# Patient Record
Sex: Male | Born: 1935 | Race: White | Hispanic: No | Smoking: Former smoker
Health system: Southern US, Community
[De-identification: ages and names within clinical notes are randomized; demographics above are authoritative.]

## PROBLEM LIST (undated history)

## (undated) DIAGNOSIS — K921 Melena: Secondary | ICD-10-CM

## (undated) DIAGNOSIS — I10 Essential (primary) hypertension: Secondary | ICD-10-CM

## (undated) DIAGNOSIS — C679 Malignant neoplasm of bladder, unspecified: Secondary | ICD-10-CM

## (undated) DIAGNOSIS — Z9889 Other specified postprocedural states: Secondary | ICD-10-CM

## (undated) DIAGNOSIS — I480 Paroxysmal atrial fibrillation: Secondary | ICD-10-CM

## (undated) DIAGNOSIS — K5792 Diverticulitis of intestine, part unspecified, without perforation or abscess without bleeding: Secondary | ICD-10-CM

## (undated) DIAGNOSIS — I499 Cardiac arrhythmia, unspecified: Secondary | ICD-10-CM

## (undated) DIAGNOSIS — IMO0002 Reserved for concepts with insufficient information to code with codable children: Secondary | ICD-10-CM

## (undated) DIAGNOSIS — N179 Acute kidney failure, unspecified: Secondary | ICD-10-CM

## (undated) DIAGNOSIS — IMO0001 Reserved for inherently not codable concepts without codable children: Secondary | ICD-10-CM

## (undated) DIAGNOSIS — Z5189 Encounter for other specified aftercare: Secondary | ICD-10-CM

## (undated) DIAGNOSIS — C61 Malignant neoplasm of prostate: Secondary | ICD-10-CM

## (undated) DIAGNOSIS — G709 Myoneural disorder, unspecified: Secondary | ICD-10-CM

## (undated) HISTORY — DX: Malignant neoplasm of prostate: C61

## (undated) HISTORY — DX: Diverticulitis of intestine, part unspecified, without perforation or abscess without bleeding: K57.92

## (undated) HISTORY — PX: BLADDER REMOVAL: SHX567

## (undated) HISTORY — DX: Encounter for other specified aftercare: Z51.89

## (undated) HISTORY — DX: Reserved for concepts with insufficient information to code with codable children: IMO0002

## (undated) HISTORY — PX: COLOSTOMY: SHX63

## (undated) HISTORY — DX: Reserved for inherently not codable concepts without codable children: IMO0001

## (undated) HISTORY — DX: Melena: K92.1

## (undated) HISTORY — PX: OTHER SURGICAL HISTORY: SHX169

## (undated) HISTORY — DX: Acute kidney failure, unspecified: N17.9

## (undated) HISTORY — DX: Essential (primary) hypertension: I10

## (undated) HISTORY — DX: Other specified postprocedural states: Z98.890

## (undated) HISTORY — DX: Malignant neoplasm of bladder, unspecified: C67.9

## (undated) HISTORY — DX: Paroxysmal atrial fibrillation: I48.0

---

## 1991-09-16 HISTORY — PX: PROSTATECTOMY: SHX69

## 2006-10-08 ENCOUNTER — Encounter: Payer: Self-pay | Admitting: Family Medicine

## 2007-02-16 ENCOUNTER — Emergency Department: Payer: Self-pay | Admitting: Emergency Medicine

## 2007-04-06 ENCOUNTER — Ambulatory Visit: Payer: Self-pay | Admitting: Urology

## 2007-04-06 ENCOUNTER — Other Ambulatory Visit: Payer: Self-pay

## 2007-04-20 ENCOUNTER — Ambulatory Visit: Payer: Self-pay | Admitting: Urology

## 2007-11-24 ENCOUNTER — Emergency Department: Payer: Self-pay | Admitting: Emergency Medicine

## 2009-11-14 ENCOUNTER — Encounter: Payer: Self-pay | Admitting: Family Medicine

## 2010-02-03 ENCOUNTER — Emergency Department: Payer: Self-pay | Admitting: Emergency Medicine

## 2010-02-14 ENCOUNTER — Ambulatory Visit: Payer: Self-pay | Admitting: Gastroenterology

## 2010-05-14 ENCOUNTER — Ambulatory Visit: Payer: Self-pay | Admitting: Gastroenterology

## 2010-05-17 LAB — PATHOLOGY REPORT

## 2010-06-05 ENCOUNTER — Encounter: Payer: Self-pay | Admitting: Family Medicine

## 2010-07-23 ENCOUNTER — Ambulatory Visit: Payer: Self-pay | Admitting: Family Medicine

## 2010-07-23 DIAGNOSIS — K259 Gastric ulcer, unspecified as acute or chronic, without hemorrhage or perforation: Secondary | ICD-10-CM | POA: Insufficient documentation

## 2010-07-23 DIAGNOSIS — K921 Melena: Secondary | ICD-10-CM | POA: Insufficient documentation

## 2010-07-23 DIAGNOSIS — R32 Unspecified urinary incontinence: Secondary | ICD-10-CM | POA: Insufficient documentation

## 2010-10-15 NOTE — Letter (Signed)
Summary: [facility]   Imported By: Maryln Gottron 08/12/2010 14:59:27  _____________________________________________________________________  External Attachment:    Type:   Image     Comment:   External Document

## 2010-10-15 NOTE — Letter (Signed)
Summary: [facility]   Imported By: Maryln Gottron 08/12/2010 14:57:28  _____________________________________________________________________  External Attachment:    Type:   Image     Comment:   External Document

## 2010-10-15 NOTE — Assessment & Plan Note (Signed)
Summary: NEW PT TO BE ESTABLISHED/Timothy Bullock   Vital Signs:  Patient profile:   77 year old male Height:      72 inches Weight:      192.75 pounds BMI:     26.24 Temp:     97.9 degrees F oral Pulse rate:   56 / minute Pulse rhythm:   regular BP sitting:   124 / 80  (left arm) Cuff size:   regular  Vitals Entered By: Delilah Shan CMA Duncan Dull) (July 23, 2010 10:16 AM) CC: New Patient to Establish   History of Present Illness: GIB and endoscopy 2011 per [facility]- gastric ulcer found- treated and healed.  Now on prilosec 20mg  a day.  Had colonoscopy done about 6 years ago.    Autologous transfusion with prostate surgery.  followed by Dr. Achilles Dunk with Uro.    Has incontinence and wears a pad.  We discussed this today.   Preventive Screening-Counseling & Management  Alcohol-Tobacco     Smoking Status: quit  Caffeine-Diet-Exercise     Does Patient Exercise: yes      Drug Use:  no.        Blood Transfusions:  yes.    Allergies (verified): No Known Drug Allergies  Past History:  Family History: Last updated: 07/23/2010 Family History of Colon CA 1st degree relative <60, parents F dead at 36, smoker, alcohol, cancer of tongue? M dead at 56, h/o colon CA, died from staph infection after colon CA surgery  Social History: Last updated: 07/23/2010 Retired [employer] Married, 1982 Former Smoker, 1967 Alcohol use-yes, rare Drug use-no Regular exercise-yes, walking, golf From [place]  Past Medical History: Blood transfusion-autologous donation with prostate surgery GASTRIC ULCER (ICD-531.90) BLOOD IN STOOL (ICD-578.1) URINARY INCONTINENCE (ICD-788.30) Prostate CA- s/p resection.    Past Surgical History: 1993  Prostate Removal  Family History: Reviewed history and no changes required. Family History of Colon CA 1st degree relative <60, parents F dead at 60, smoker, alcohol, cancer of tongue? M dead at 86, h/o colon CA, died from staph infection after colon  CA surgery  Social History: Reviewed history and no changes required. Retired [employer] Married, 1982 Former Smoker, 1967 Alcohol use-yes, rare Drug use-no Regular exercise-yes, walking, golf From [employer] Transfusions:  yes Smoking Status:  quit Drug Use:  no Does Patient Exercise:  yes  Review of Systems       See HPI.  Otherwise negative.    Physical Exam  General:  GEN: nad, alert and oriented HEENT: mucous membranes moist NECK: supple w/o LA CV: rrr.  no murmur PULM: ctab, no inc wob ABD: soft, +bs EXT: no edema SKIN: no acute rash    Impression & Recommendations:  Problem # 1:  URINARY INCONTINENCE (ICD-788.30) We talked about options.  He is considering surgical intervention and I advised him to weight the risk and benefits.  He plans to discuss further with uro.  It does affect his life.    Problem # 2:  ? of CHICKENPOX (ICD-052.9) Unclear if patient ever had varicella. Will check lab and notify patient.  If pos, then he would be candidate for zostavax.  Orders: T- * Misc. Laboratory test [phone])  Complete Medication List: 1)  Glucosamine-chondroitin 250-200 Mg Caps (Glucosamine-chondroitin) .... Take 1 tablet by mouth once a day  Other Orders: Flu Vaccine 58yrs + MEDICARE PATIENTS (J8119) Administration Flu vaccine - MCR (J4782)  Patient Instructions: 1)  You can get your results through our phone  system.  Follow the instructions on the blue card.  Take care.  We'll requeset your records.  Take care.  Glad to see you today.  Let me know if you have other concerns.    Orders Added: 1)  New Patient Level II [99202] 2)  T- * Misc. Laboratory test [99999] 3)  Flu Vaccine 42yrs + MEDICARE PATIENTS [Q2039] 4)  Administration Flu vaccine - MCR [G0008]    Current Allergies (reviewed today): No known allergies    Flu Vaccine Consent Questions     Do you have a history of severe allergic reactions to this vaccine? no    Any prior history  of allergic reactions to egg and/or gelatin? no    Do you have a sensitivity to the preservative Thimersol? no    Do you have a past history of Guillan-Barre Syndrome? no    Do you currently have an acute febrile illness? no    Have you ever had a severe reaction to latex? no    Vaccine information given and explained to patient? yes    Are you currently pregnant? no    Lot Number:[id number]   Exp Date:03/15/2011   Site Given  Left Deltoid IM[facility] Lugene Fuquay CMA (AAMA)  July 23, 2010 11:40 AM

## 2010-10-15 NOTE — Letter (Signed)
Summary: [facility]   Imported By: Maryln Gottron 08/12/2010 15:01:21  _____________________________________________________________________  External Attachment:    Type:   Image     Comment:   External Document

## 2011-02-05 ENCOUNTER — Telehealth: Payer: Self-pay | Admitting: *Deleted

## 2011-02-05 MED ORDER — OMEPRAZOLE 20 MG PO CPDR: 20.0000 mg | DELAYED_RELEASE_CAPSULE | Freq: Every day | ORAL | Status: DC

## 2011-02-05 NOTE — Telephone Encounter (Signed)
Pt's wife is asking that pt gets zostavax.  She says she has checked with their insurance company and it is covered. Please advise if ok to schedule.  Also, pt has been taking prilosec otc but would like to get a script sent to [facility].

## 2011-02-05 NOTE — Telephone Encounter (Signed)
prilosec order send to [facility].  I would get the zostavax.  Please help patient arrange this.  Thanks.

## 2011-02-06 NOTE — Telephone Encounter (Signed)
Left message on machine at home advising patient as instructed.  Advised him to either contact his pharmacy or call our office to get shingles vaccine.

## 2011-02-12 ENCOUNTER — Ambulatory Visit (INDEPENDENT_AMBULATORY_CARE_PROVIDER_SITE_OTHER): Payer: Medicare Other | Admitting: Family Medicine

## 2011-02-12 DIAGNOSIS — Z23 Encounter for immunization: Secondary | ICD-10-CM

## 2011-02-12 DIAGNOSIS — Z2911 Encounter for prophylactic immunotherapy for respiratory syncytial virus (RSV): Secondary | ICD-10-CM

## 2011-02-12 NOTE — Progress Notes (Signed)
Zostavax given SQ Right deltoid

## 2011-06-30 ENCOUNTER — Encounter: Payer: Self-pay | Admitting: Family Medicine

## 2011-07-01 ENCOUNTER — Encounter: Payer: Self-pay | Admitting: Family Medicine

## 2011-07-01 ENCOUNTER — Ambulatory Visit (INDEPENDENT_AMBULATORY_CARE_PROVIDER_SITE_OTHER): Payer: Medicare Other | Admitting: Family Medicine

## 2011-07-01 VITALS — BP 152/70 | HR 58 | Temp 97.9°F | Wt 192.0 lb

## 2011-07-01 DIAGNOSIS — Z23 Encounter for immunization: Secondary | ICD-10-CM

## 2011-07-01 DIAGNOSIS — R32 Unspecified urinary incontinence: Secondary | ICD-10-CM

## 2011-07-01 DIAGNOSIS — IMO0001 Reserved for inherently not codable concepts without codable children: Secondary | ICD-10-CM

## 2011-07-01 DIAGNOSIS — R030 Elevated blood-pressure reading, without diagnosis of hypertension: Secondary | ICD-10-CM

## 2011-07-01 LAB — BASIC METABOLIC PANEL
BUN: 17 mg/dL (ref 6–23)
Calcium: 8.9 mg/dL (ref 8.4–10.5)
GFR: 104.07 mL/min (ref >60.00)
Potassium: 4.4 mEq/L (ref 3.5–5.1)
Sodium: 138 mEq/L (ref 135–145)

## 2011-07-01 NOTE — Patient Instructions (Signed)
Try to cut back on salt and drink plenty of water.  I want you to get a cuff (Omron) and check your pressure in the morning (after your get out of bed, empty your bladder, and then sit down for a few minutes).  Let me know about the numbers.  You can get your results through our phone system.  Follow the instructions on the blue card.

## 2011-07-01 NOTE — Progress Notes (Signed)
Had seen Dr. Achilles Dunk with URO and BP was up, 174/~70.  No meds.   Never had dx of HTN prev. Feeling well o/w.   Lightheadedness: single episode with position change last week, brief.  Otherwise, no sx. Chest pain with exertion:no Edema:no Short of breath:no Exercise: golf 3-4 times a week.  Planning on joining the gym Salt in diet: "I love salt but I don't overeat it, I don't salt my food."    Prostate cancer s/p prostate removal and lupron.  PSA was still low with Dr. Achilles Dunk.  He has talked with Dr. Achilles Dunk about surgery for incontinence.  He's leaning toward surgery, but hasn't made a final decision.    Colonoscopy done ~2006 per prev report.  Per pt, no polyps noted.  This was his second colonoscopy.  Meds, vitals, and allergies reviewed.   PMH and SH reviewed  ROS: See HPI.  Otherwise negative.    GEN: nad, alert and oriented HEENT: mucous membranes moist NECK: supple w/o LA CV: rrr. PULM: ctab, no inc wob ABD: soft, +bs EXT: no edema SKIN: no acute rash

## 2011-07-02 ENCOUNTER — Encounter: Payer: Self-pay | Admitting: Family Medicine

## 2011-07-02 DIAGNOSIS — IMO0001 Reserved for inherently not codable concepts without codable children: Secondary | ICD-10-CM | POA: Insufficient documentation

## 2011-07-02 NOTE — Assessment & Plan Note (Signed)
No formal dx of HTN, will check BP at home and call back with update.  Check labs today.  See notes on labs.  Continue exercise, drink water and avoid salt.  He agrees.  >25 min spent with face to face with patient, >50% counseling and/or coordinating care

## 2011-07-02 NOTE — Assessment & Plan Note (Signed)
We talked about this.  If he and Dr. Achilles Dunk feel the benefit of the surgery outweighs the risk, then it makes sense to proceed.  I will defer to them.  He will consider.  I have no formal opinion on this.

## 2011-07-19 ENCOUNTER — Telehealth: Payer: Self-pay | Admitting: Family Medicine

## 2011-07-19 NOTE — Telephone Encounter (Signed)
Please call pt and thank him for the update.  I looked at his BP readings.  Usually low 140s/upper 60s-low70s.  Pulse often in 50s.  I wouldn't treat BP at this point.  I am worried that the treatment may induce hypotension, making him more lightheaded (especially since he has a low pulse rate).  I would work to limit/decrease salt, stay active, and occ monitor his BP. I think the prev elevations in BP aren't indicative of an ongoing problem.  Thanks.

## 2011-07-21 NOTE — Telephone Encounter (Signed)
Left detailed message on patient's answering machine asking him to call if he has any questions.

## 2011-09-16 HISTORY — PX: CARDIAC CATHETERIZATION: SHX172

## 2011-12-17 DIAGNOSIS — R339 Retention of urine, unspecified: Secondary | ICD-10-CM | POA: Diagnosis not present

## 2011-12-17 DIAGNOSIS — N320 Bladder-neck obstruction: Secondary | ICD-10-CM | POA: Diagnosis not present

## 2011-12-17 DIAGNOSIS — C61 Malignant neoplasm of prostate: Secondary | ICD-10-CM | POA: Diagnosis not present

## 2011-12-17 DIAGNOSIS — N393 Stress incontinence (female) (male): Secondary | ICD-10-CM | POA: Diagnosis not present

## 2012-01-22 DIAGNOSIS — L821 Other seborrheic keratosis: Secondary | ICD-10-CM | POA: Diagnosis not present

## 2012-01-22 DIAGNOSIS — Z85828 Personal history of other malignant neoplasm of skin: Secondary | ICD-10-CM | POA: Diagnosis not present

## 2012-01-22 DIAGNOSIS — L570 Actinic keratosis: Secondary | ICD-10-CM | POA: Diagnosis not present

## 2012-02-14 HISTORY — PX: COLONOSCOPY: SHX174

## 2012-02-16 ENCOUNTER — Ambulatory Visit: Payer: Self-pay | Admitting: Gastroenterology

## 2012-02-16 DIAGNOSIS — K6389 Other specified diseases of intestine: Secondary | ICD-10-CM | POA: Diagnosis not present

## 2012-02-16 DIAGNOSIS — D126 Benign neoplasm of colon, unspecified: Secondary | ICD-10-CM | POA: Diagnosis not present

## 2012-02-16 DIAGNOSIS — Z1211 Encounter for screening for malignant neoplasm of colon: Secondary | ICD-10-CM | POA: Diagnosis not present

## 2012-02-16 DIAGNOSIS — K648 Other hemorrhoids: Secondary | ICD-10-CM | POA: Diagnosis not present

## 2012-02-16 DIAGNOSIS — Z8546 Personal history of malignant neoplasm of prostate: Secondary | ICD-10-CM | POA: Diagnosis not present

## 2012-02-16 DIAGNOSIS — K5730 Diverticulosis of large intestine without perforation or abscess without bleeding: Secondary | ICD-10-CM | POA: Diagnosis not present

## 2012-02-16 DIAGNOSIS — K620 Anal polyp: Secondary | ICD-10-CM | POA: Diagnosis not present

## 2012-02-16 DIAGNOSIS — D128 Benign neoplasm of rectum: Secondary | ICD-10-CM | POA: Diagnosis not present

## 2012-02-16 DIAGNOSIS — Z800 Family history of malignant neoplasm of digestive organs: Secondary | ICD-10-CM | POA: Diagnosis not present

## 2012-02-16 DIAGNOSIS — N419 Inflammatory disease of prostate, unspecified: Secondary | ICD-10-CM | POA: Diagnosis not present

## 2012-02-16 DIAGNOSIS — K621 Rectal polyp: Secondary | ICD-10-CM | POA: Diagnosis not present

## 2012-02-16 DIAGNOSIS — Z8711 Personal history of peptic ulcer disease: Secondary | ICD-10-CM | POA: Diagnosis not present

## 2012-02-16 DIAGNOSIS — K2970 Gastritis, unspecified, without bleeding: Secondary | ICD-10-CM | POA: Diagnosis not present

## 2012-02-17 LAB — PATHOLOGY REPORT

## 2012-02-25 ENCOUNTER — Encounter: Payer: Self-pay | Admitting: Family Medicine

## 2012-02-25 ENCOUNTER — Other Ambulatory Visit: Payer: Self-pay | Admitting: Family Medicine

## 2012-03-05 ENCOUNTER — Encounter: Payer: Self-pay | Admitting: Family Medicine

## 2012-04-08 ENCOUNTER — Encounter: Payer: Self-pay | Admitting: Family Medicine

## 2012-04-08 ENCOUNTER — Encounter: Payer: Self-pay | Admitting: *Deleted

## 2012-04-08 ENCOUNTER — Ambulatory Visit (INDEPENDENT_AMBULATORY_CARE_PROVIDER_SITE_OTHER): Payer: Medicare Other | Admitting: Family Medicine

## 2012-04-08 ENCOUNTER — Telehealth: Payer: Self-pay

## 2012-04-08 VITALS — BP 124/72 | HR 65 | Temp 97.7°F | Wt 190.0 lb

## 2012-04-08 DIAGNOSIS — R000 Tachycardia, unspecified: Secondary | ICD-10-CM

## 2012-04-08 DIAGNOSIS — R002 Palpitations: Secondary | ICD-10-CM | POA: Diagnosis not present

## 2012-04-08 LAB — CBC WITH DIFFERENTIAL/PLATELET
Basophils Relative: 0.4 % (ref 0.0–3.0)
Eosinophils Absolute: 0.2 10*3/uL (ref 0.0–0.7)
Eosinophils Relative: 2.4 % (ref 0.0–5.0)
Hemoglobin: 14 g/dL (ref 13.0–17.0)
Lymphocytes Relative: 25.2 % (ref 12.0–46.0)
MCHC: 33.5 g/dL (ref 30.0–36.0)
Monocytes Relative: 10.4 % (ref 3.0–12.0)
Neutro Abs: 5.1 10*3/uL (ref 1.4–7.7)
Neutrophils Relative %: 61.6 % (ref 43.0–77.0)
RBC: 4.47 Mil/uL (ref 4.22–5.81)
WBC: 8.3 10*3/uL (ref 4.5–10.5)

## 2012-04-08 LAB — BASIC METABOLIC PANEL
Calcium: 9 mg/dL (ref 8.4–10.5)
Creatinine, Ser: 0.9 mg/dL (ref 0.4–1.5)
GFR: 91.42 mL/min (ref >60.00)
Sodium: 138 mEq/L (ref 135–145)

## 2012-04-08 MED ORDER — METOPROLOL TARTRATE 12.5 MG HALF TABLET: 12.5000 mg | ORAL_TABLET | Freq: Two times a day (BID) | ORAL | Status: DC | PRN

## 2012-04-08 NOTE — Progress Notes (Signed)
2 episodes of tachycardia in last few days, pulse suddenly up to ~90-100 w/o exertion.  No CP, SOB, BLE edema.  Episodes can last up to a few hours.  No med changes.  Cut back on soda and coffee recently.  Prev drank a cup of coffee per day.  No syncope.  Getting ready to leave town.  He gives a history of palpitations over the years, in distant past.    Feeling well at time of exam.   Meds, vitals, and allergies reviewed.   ROS: See HPI.  Otherwise, noncontributory.  nad ncat Mmm rrr ctab abd soft, not ttp Ext w/o edema  EKG reviewed.

## 2012-04-08 NOTE — Assessment & Plan Note (Signed)
Possible SVT.  Will refer to cards, check labs and use BB in meantime prn.  He agrees. Okay for outpatient f/u.

## 2012-04-08 NOTE — Patient Instructions (Addendum)
Go to the lab on the way out.  We'll contact you with your lab report. See Shirlee Limerick about your referral before you leave today. Take metoprolol if you have an episode.  You can take it twice a day.  Take care.

## 2012-04-08 NOTE — Telephone Encounter (Signed)
Pt walked in; since 4 am this morning pt states heart rate has been in the 90's. Normal rate 60. Pt said happened one other time on 04/03/12 for 5-6 hours.(pt had several people at his home for cook out on Sat thought that was reason for fast heart beat.) Today BP 130/70s at home and pulse in 90s. No irregular heart beat, chest pain,SOB, h/a or dizziness. Once head felt fuzzy but not now. Pulse now 68, pt sitting at rest. Dr Para March will see now.

## 2012-04-15 ENCOUNTER — Ambulatory Visit: Payer: Medicare Other | Admitting: Cardiovascular Disease

## 2012-04-15 ENCOUNTER — Encounter: Payer: Self-pay | Admitting: Cardiovascular Disease

## 2012-04-15 ENCOUNTER — Ambulatory Visit (INDEPENDENT_AMBULATORY_CARE_PROVIDER_SITE_OTHER): Payer: Medicare Other | Admitting: Cardiovascular Disease

## 2012-04-15 VITALS — BP 120/70 | Ht 72.0 in | Wt 191.5 lb

## 2012-04-15 DIAGNOSIS — R002 Palpitations: Secondary | ICD-10-CM

## 2012-04-15 NOTE — Progress Notes (Signed)
HPI  [name] is a pleasant 77 year old male who was referred by Dr. Para March for evaluation of palpitations. The patient reports no previous cardiac history. She is overall healthy and has no previous history of hypertension, diabetes or hyperlipidemia. He is not aware of any previous arrhythmia. Other than omeprazole, he does not take any medications on regular basis. Recently, he had few episodes of palpitations with increase in baseline heart rate. Usually his heart rate is in the low 60s and increase suddenly to the 90s at rest without activities. He felt somewhat unusual with slight dizziness. He denies any chest pain, dyspnea, orthopnea or syncope. His baseline ECG was normal. He had labs performed which showed normal thyroid function and overall were unremarkable. He cut down on caffeine intake. He was given metoprolol to use as needed. He used it one time but did not help according to him.  No Known Allergies   Current Outpatient Prescriptions on File Prior to Visit  Medication Sig Dispense Refill  . glucosamine-chondroitin 500-400 MG tablet Take 1 tablet by mouth daily.        Marland Kitchen omeprazole (PRILOSEC) 20 MG capsule TAKE 1 CAPSULE DAILY  90 capsule  1     Past Medical History  Diagnosis Date  . 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 Ulcer     gastric  . Blood in stool   . Urinary incontinence   . Prostate cancer     s/p resection     Past Surgical History  Procedure Date  . Prostatectomy 1993  . Colonoscopy 02/2012    negative pathology      Family History  Problem Relation Age of Onset  . Cancer Mother     H/O colon CA  . Alcohol abuse Father   . Cancer Father     CA of tongue  . Cancer Other     colon, parents <60     History   Social History  . Marital Status: Married    Spouse Name: N/A    Number of Children: N/A  . Years of Education: N/A   Occupational History  . Retired    Social History Main Topics  . Smoking  status: Former Smoker    Types: Cigarettes    Quit date: 09/15/1965  . Smokeless tobacco: Not on file  . Alcohol Use: Yes     Rare  . Drug Use: No  . Sexually Active: Not on file   Other Topics Concern  . Not on file   Social History Narrative   Married 1982.High school gradRegular exercise:  Yes, walking, golfFrom [place]     ROS Constitutional: Negative for fever, chills, diaphoresis, activity change, appetite change and fatigue.  HENT: Negative for hearing loss, nosebleeds, congestion, sore throat, facial swelling, drooling, trouble swallowing, neck pain, voice change, sinus pressure and tinnitus.  Eyes: Negative for photophobia, pain, discharge and visual disturbance.  Respiratory: Negative for apnea, cough, chest tightness, shortness of breath and wheezing.  Cardiovascular: Negative for chest pain,  leg swelling.  Gastrointestinal: Negative for nausea, vomiting, abdominal pain, diarrhea, constipation, blood in stool and abdominal distention.  Genitourinary: Negative for dysuria, urgency, frequency, hematuria and decreased urine volume.  Musculoskeletal: Negative for myalgias, back pain, joint swelling, arthralgias and gait problem.  Skin: Negative for color change, pallor, rash and wound.  Neurological: Negative for dizziness, tremors, seizures, syncope, speech difficulty, weakness, light-headedness, numbness and headaches.  Psychiatric/Behavioral: Negative for suicidal ideas,  hallucinations, behavioral problems and agitation. The patient is not nervous/anxious.     PHYSICAL EXAM   BP 120/70  Ht 6' (1.829 m)  Wt 191 lb 8 oz (86.864 kg)  BMI 25.97 kg/m2  Constitutional: He is oriented to person, place, and time. He appears well-developed and well-nourished. No distress.  HENT: No nasal discharge.  Head: Normocephalic and atraumatic.  Eyes: Pupils are equal and round. Right eye exhibits no discharge. Left eye exhibits no discharge.  Neck: Normal range of motion. Neck  supple. No JVD present. No thyromegaly present.  Cardiovascular: Normal rate, regular rhythm, normal heart sounds and. Exam reveals no gallop and no friction rub. No murmur heard.  Pulmonary/Chest: Effort normal and breath sounds normal. No stridor. No respiratory distress. He has no wheezes. He has no rales. He exhibits no tenderness.  Abdominal: Soft. Bowel sounds are normal. He exhibits no distension. There is no tenderness. There is no rebound and no guarding.  Musculoskeletal: Normal range of motion. He exhibits no edema and no tenderness.  Neurological: He is alert and oriented to person, place, and time. Coordination normal.  Skin: Skin is warm and dry. No rash noted. He is not diaphoretic. No erythema. No pallor.  Psychiatric: He has a normal mood and affect. His behavior is normal. Judgment and thought content normal.      EKG: Sinus  Bradycardia  - occasional PAC    # PACs = 1. -Right bundle branch block.    ASSESSMENT AND PLAN

## 2012-04-15 NOTE — Patient Instructions (Addendum)
Your physician has requested that you have an echocardiogram. Echocardiography is a painless test that uses sound waves to create images of your heart. It provides your doctor with information about the size and shape of your heart and how well your heart's chambers and valves are working. This procedure takes approximately one hour. There are no restrictions for this procedure.  Your physician has recommended that you wear a holter monitor. Holter monitors are medical devices that record the heart's electrical activity. Doctors most often use these monitors to diagnose arrhythmias. Arrhythmias are problems with the speed or rhythm of the heartbeat. The monitor is a small, portable device. You can wear one while you do your normal daily activities. This is usually used to diagnose what is causing palpitations/syncope (passing out).  Follow up as needed.   

## 2012-04-15 NOTE — Assessment & Plan Note (Signed)
The patient had recent palpitations without any chest pain or dyspnea. He is noted to have some PACs and baseline EKG with right bundle branch block. His heart rate was around 90 beats per minute during the episode so significant tachycardia seems to be unlikely. Recent labs were unremarkable. He has no symptoms suggestive of angina or heart failure. I recommend evaluation with a 48-hour Holter monitor as well as an echocardiogram. Continue treatment with metoprolol as needed for now.

## 2012-05-04 ENCOUNTER — Other Ambulatory Visit: Payer: Self-pay

## 2012-05-04 ENCOUNTER — Encounter (INDEPENDENT_AMBULATORY_CARE_PROVIDER_SITE_OTHER): Payer: Medicare Other

## 2012-05-04 ENCOUNTER — Other Ambulatory Visit (INDEPENDENT_AMBULATORY_CARE_PROVIDER_SITE_OTHER): Payer: Medicare Other

## 2012-05-04 DIAGNOSIS — I059 Rheumatic mitral valve disease, unspecified: Secondary | ICD-10-CM | POA: Diagnosis not present

## 2012-05-04 DIAGNOSIS — R002 Palpitations: Secondary | ICD-10-CM

## 2012-05-12 ENCOUNTER — Telehealth: Payer: Self-pay

## 2012-05-12 NOTE — Telephone Encounter (Signed)
Pt informed. appt made for 05/18/12

## 2012-05-12 NOTE — Telephone Encounter (Signed)
LM with wife to have pt call me back to schedule f/u appt with Dr. Kirke Corin to discuss HM results.

## 2012-05-18 ENCOUNTER — Ambulatory Visit (INDEPENDENT_AMBULATORY_CARE_PROVIDER_SITE_OTHER): Payer: Medicare Other | Admitting: Cardiovascular Disease

## 2012-05-18 ENCOUNTER — Encounter: Payer: Self-pay | Admitting: Cardiovascular Disease

## 2012-05-18 VITALS — BP 140/80 | HR 56 | Ht 72.0 in | Wt 191.5 lb

## 2012-05-18 DIAGNOSIS — R0609 Other forms of dyspnea: Secondary | ICD-10-CM | POA: Diagnosis not present

## 2012-05-18 DIAGNOSIS — R0989 Other specified symptoms and signs involving the circulatory and respiratory systems: Secondary | ICD-10-CM | POA: Diagnosis not present

## 2012-05-18 DIAGNOSIS — R002 Palpitations: Secondary | ICD-10-CM | POA: Diagnosis not present

## 2012-05-18 DIAGNOSIS — R0600 Dyspnea, unspecified: Secondary | ICD-10-CM

## 2012-05-18 MED ORDER — METOPROLOL TARTRATE 25 MG PO TABS: 25.0000 mg | ORAL_TABLET | Freq: Two times a day (BID) | ORAL | Status: DC

## 2012-05-18 NOTE — Patient Instructions (Addendum)
Your physician has requested that you have en exercise stress myoview. For further information please visit [email or web address] Please follow instruction sheet, as given.  Use Metoprolol 25 mg as needed for palpitations.  Follow up in 3 months.

## 2012-05-20 ENCOUNTER — Encounter: Payer: Self-pay | Admitting: Cardiovascular Disease

## 2012-05-20 DIAGNOSIS — R0600 Dyspnea, unspecified: Secondary | ICD-10-CM | POA: Insufficient documentation

## 2012-05-20 NOTE — Progress Notes (Signed)
HPI  Timothy Bullock is a pleasant 78 year old male who is here today for a followup visit regarding palpitations. The patient reports no previous cardiac history. She is overall healthy and has no previous history of hypertension, diabetes or hyperlipidemia. He is not aware of any previous arrhythmia. He was noted to have premature beats and was given metoprolol to use as needed. She had a Holter monitor done which showed normal sinus rhythm with frequent PACs with short atrial runs and frequent PVCs. His average heart rate was 50 beats per minute with episodes of bradycardia but no prolonged pauses. He had echocardiogram done which showed normal LV systolic function with only mild mitral and tricuspid regurgitation. He continues to complain of palpitations with sudden acceleration of heart rate that this does not last for a long time. He denies dizziness, syncope or presyncope. He does notice increased dyspnea when he is having these episodes but has no chest pain.  No Known Allergies   Current Outpatient Prescriptions on File Prior to Visit  Medication Sig Dispense Refill  . glucosamine-chondroitin 500-400 MG tablet Take 1 tablet by mouth daily.        Marland Kitchen omeprazole (PRILOSEC) 20 MG capsule TAKE 1 CAPSULE DAILY  90 capsule  1     Past Medical History  Diagnosis Date  . 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 Ulcer     gastric  . Blood in stool   . Urinary incontinence   . Prostate cancer     s/p resection     Past Surgical History  Procedure Date  . Prostatectomy 1993  . Colonoscopy 02/2012    negative pathology      Family History  Problem Relation Age of Onset  . Cancer Mother     H/O colon CA  . Alcohol abuse Father   . Cancer Father     CA of tongue  . Cancer Other     colon, parents <60     History   Social History  . Marital Status: Married    Spouse Name: N/A    Number of Children: N/A  . Years of Education: N/A   Occupational  History  . Retired    Social History Main Topics  . Smoking status: Former Smoker    Types: Cigarettes    Quit date: 09/15/1965  . Smokeless tobacco: Not on file  . Alcohol Use: Yes     Rare  . Drug Use: No  . Sexually Active: Not on file   Other Topics Concern  . Not on file   Social History Narrative   Married 1982.High school gradRegular exercise:  Yes, walking, golfFrom [place]     PHYSICAL EXAM   BP 140/80  Pulse 56  Ht 6' (1.829 m)  Wt 191 lb 8 oz (86.864 kg)  BMI 25.97 kg/m2 Constitutional: He is oriented to person, place, and time. He appears well-developed and well-nourished. No distress.  HENT: No nasal discharge.  Head: Normocephalic and atraumatic.  Eyes: Pupils are equal and round. Right eye exhibits no discharge. Left eye exhibits no discharge.  Neck: Normal range of motion. Neck supple. No JVD present. No thyromegaly present.  Cardiovascular: Normal rate, regular rhythm, normal heart sounds and. Exam reveals no gallop and no friction rub. No murmur heard.  Pulmonary/Chest: Effort normal and breath sounds normal. No stridor. No respiratory distress. He has no wheezes. He has no rales. He exhibits no tenderness.  Abdominal:  Soft. Bowel sounds are normal. He exhibits no distension. There is no tenderness. There is no rebound and no guarding.  Musculoskeletal: Normal range of motion. He exhibits no edema and no tenderness.  Neurological: He is alert and oriented to person, place, and time. Coordination normal.  Skin: Skin is warm and dry. No rash noted. He is not diaphoretic. No erythema. No pallor.  Psychiatric: He has a normal mood and affect. His behavior is normal. Judgment and thought content normal.       EKG: Sinus  Bradycardia  -Right bundle branch block.   ABNORMAL    ASSESSMENT AND PLAN

## 2012-05-20 NOTE — Assessment & Plan Note (Signed)
He does complain of increased dyspnea during these episodes. Given his frequent PVCs noted, I think it is important to exclude ischemic heart disease. Thus, I will request a treadmill nuclear stress test for evaluation. Given his frequent PACs and PVCs, a treadmill stress test alone is not sufficient.

## 2012-05-20 NOTE — Assessment & Plan Note (Signed)
His symptoms are due to frequent PACs with short atrial tachycardia runs. He also has frequent PVCs. I think it would be difficult to put him on metoprolol in a regular basis due to his noted bradycardia. His average heart rate was only 50 beats per minute. 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 However, his symptoms are not severe enough to warrant pacemaker at this time. I asked him to use metoprolol 25 mg as needed if he develops prolonged palpitations. For now we'll continue to monitor his symptoms.

## 2012-05-25 ENCOUNTER — Telehealth: Payer: Self-pay

## 2012-05-25 ENCOUNTER — Other Ambulatory Visit: Payer: Self-pay

## 2012-05-25 ENCOUNTER — Ambulatory Visit: Payer: Self-pay | Admitting: Cardiovascular Disease

## 2012-05-25 DIAGNOSIS — I4891 Unspecified atrial fibrillation: Secondary | ICD-10-CM | POA: Diagnosis not present

## 2012-05-25 DIAGNOSIS — R0609 Other forms of dyspnea: Secondary | ICD-10-CM | POA: Diagnosis not present

## 2012-05-25 DIAGNOSIS — R948 Abnormal results of function studies of other organs and systems: Secondary | ICD-10-CM | POA: Diagnosis not present

## 2012-05-25 DIAGNOSIS — R0602 Shortness of breath: Secondary | ICD-10-CM | POA: Diagnosis not present

## 2012-05-25 MED ORDER — METOPROLOL TARTRATE 25 MG PO TABS: 12.5000 mg | ORAL_TABLET | Freq: Two times a day (BID) | ORAL | Status: DC

## 2012-05-25 MED ORDER — ASPIRIN EC 81 MG PO TBEC: 81.0000 mg | DELAYED_RELEASE_TABLET | Freq: Every day | ORAL | Status: DC

## 2012-05-25 NOTE — Telephone Encounter (Signed)
Dr. Kirke Corin called to say he did stress test on pt this am. It was abnormal. He asks that I call pt and schedule him for OV this Thursday 05/27/12. He also gave orders: "Start metoprolol 12.5 mg po BID and ASA 81 mg po QD. Follow up Thursday". V.O Dr. Adline Mango, RN

## 2012-05-25 NOTE — Telephone Encounter (Signed)
Pt informed. appt made for Thursday 9/12 at 1145 He already has metoprolol 25 mg tablets he takes PRN. He will now start taking 1/2 tablet po BID as preacribed He will also start ASA 81 mg po QD

## 2012-05-27 ENCOUNTER — Encounter: Payer: Self-pay | Admitting: Cardiovascular Disease

## 2012-05-27 ENCOUNTER — Ambulatory Visit (INDEPENDENT_AMBULATORY_CARE_PROVIDER_SITE_OTHER): Payer: Medicare Other | Admitting: Cardiovascular Disease

## 2012-05-27 VITALS — BP 142/70 | HR 56 | Ht 72.0 in | Wt 191.5 lb

## 2012-05-27 DIAGNOSIS — I480 Paroxysmal atrial fibrillation: Secondary | ICD-10-CM

## 2012-05-27 DIAGNOSIS — R0609 Other forms of dyspnea: Secondary | ICD-10-CM

## 2012-05-27 DIAGNOSIS — R002 Palpitations: Secondary | ICD-10-CM

## 2012-05-27 DIAGNOSIS — Z01818 Encounter for other preprocedural examination: Secondary | ICD-10-CM | POA: Diagnosis not present

## 2012-05-27 DIAGNOSIS — Z79899 Other long term (current) drug therapy: Secondary | ICD-10-CM

## 2012-05-27 DIAGNOSIS — R0602 Shortness of breath: Secondary | ICD-10-CM | POA: Diagnosis not present

## 2012-05-27 DIAGNOSIS — R0600 Dyspnea, unspecified: Secondary | ICD-10-CM

## 2012-05-27 DIAGNOSIS — I4891 Unspecified atrial fibrillation: Secondary | ICD-10-CM

## 2012-05-27 NOTE — Patient Instructions (Addendum)

## 2012-05-28 ENCOUNTER — Encounter: Payer: Self-pay | Admitting: Cardiovascular Disease

## 2012-05-28 DIAGNOSIS — I480 Paroxysmal atrial fibrillation: Secondary | ICD-10-CM | POA: Insufficient documentation

## 2012-05-28 HISTORY — DX: Paroxysmal atrial fibrillation: I48.0

## 2012-05-28 NOTE — Progress Notes (Signed)
HPI  Timothy Bullock is a pleasant 78 year old male who is here today for a followup visit regarding palpitations. He has no history of hypertension, diabetes or hyperlipidemia.He had a Holter monitor done which showed normal sinus rhythm with frequent PACs with short atrial runs and frequent PVCs. His average heart rate was 50 beats per minute with episodes of bradycardia but no prolonged pauses. He had echocardiogram done which showed normal LV systolic function with only mild mitral and tricuspid regurgitation. He continues to complain of palpitations with sudden acceleration of heart rate that this does not last for a long time. He underwent a treadmill nuclear stress test and he is here today to discuss results. Interestingly, on baseline ECG before starting the stress test, he was noted to be in atrial fibrillation. On the treadmill, he developed atrial fibrillation with rapid ventricular response with a heart rate going above 140 beats per minute with only 2 minutes of exercise. His images showed evidence of inferior wall ischemia with normal ejection fraction.  No Known Allergies   Current Outpatient Prescriptions on File Prior to Visit  Medication Sig Dispense Refill  . aspirin EC 81 MG tablet Take 1 tablet (81 mg total) by mouth daily.  90 tablet  3  . glucosamine-chondroitin 500-400 MG tablet Take 1 tablet by mouth daily.        . metoprolol tartrate (LOPRESSOR) 25 MG tablet Take 0.5 tablets (12.5 mg total) by mouth 2 (two) times daily.  60 tablet  6  . omeprazole (PRILOSEC) 20 MG capsule TAKE 1 CAPSULE DAILY  90 capsule  1     Past Medical History  Diagnosis Date  . 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 Ulcer     gastric  . Blood in stool   . Urinary incontinence   . Prostate cancer     s/p resection     Past Surgical History  Procedure Date  . Prostatectomy 1993  . Colonoscopy 02/2012    negative pathology      Family History  Problem  Relation Age of Onset  . Cancer Mother     H/O colon CA  . Alcohol abuse Father   . Cancer Father     CA of tongue  . Cancer Other     colon, parents <60     History   Social History  . Marital Status: Married    Spouse Name: N/A    Number of Children: N/A  . Years of Education: N/A   Occupational History  . Retired    Social History Main Topics  . Smoking status: Former Smoker    Types: Cigarettes    Quit date: 09/15/1965  . Smokeless tobacco: Not on file  . Alcohol Use: Yes     Rare  . Drug Use: No  . Sexually Active: Not on file   Other Topics Concern  . Not on file   Social History Narrative   Married 1982.High school gradRegular exercise:  Yes, walking, golfFrom [place]     PHYSICAL EXAM   BP 142/70  Pulse 56  Ht 6' (1.829 m)  Wt 191 lb 8 oz (86.864 kg)  BMI 25.97 kg/m2  Constitutional: He is oriented to person, place, and time. He appears well-developed and well-nourished. No distress.  HENT: No nasal discharge.  Head: Normocephalic and atraumatic.  Eyes: Pupils are equal and round. Right eye exhibits no discharge. Left eye exhibits no discharge.  Neck:  Normal range of motion. Neck supple. No JVD present. No thyromegaly present.  Cardiovascular: Normal rate, regular rhythm with premature beats, normal heart sounds and. Exam reveals no gallop and no friction rub. No murmur heard.  Pulmonary/Chest: Effort normal and breath sounds normal. No stridor. No respiratory distress. He has no wheezes. He has no rales. He exhibits no tenderness.  Abdominal: Soft. Bowel sounds are normal. He exhibits no distension. There is no tenderness. There is no rebound and no guarding.  Musculoskeletal: Normal range of motion. He exhibits no edema and no tenderness.  Neurological: He is alert and oriented to person, place, and time. Coordination normal.  Skin: Skin is warm and dry. No rash noted. He is not diaphoretic. No erythema. No pallor.  Psychiatric: He has a normal  mood and affect. His behavior is normal. Judgment and thought content normal.       ASSESSMENT AND PLAN

## 2012-05-28 NOTE — Assessment & Plan Note (Signed)
The patient's stress test showed evidence of inferior wall ischemia. His dyspnea is likely angina equivalent. I recommend proceeding with cardiac catheterization and possible coronary intervention. Risks, benefits and alternatives were discussed with the patient. Continue treatment with aspirin and small dose metoprolol. The patient is going on a trip to [place] soon. I advised him not to engage in any significant physical exercise until we get a cardiac catheterization done.

## 2012-05-28 NOTE — Assessment & Plan Note (Signed)
This seems to be the reason for his palpitations. He is currently in normal sinus rhythm. His CHADS score is 1 and CHADS VAS score is 2. He is at moderate risk for thromboembolic complications. He is to continue aspirin for now given that he would be undergoing cardiac catheterization soon. I discussed with him the issue of anticoagulation and he is leaning towards alternatives to warfarin. Continue small dose metoprolol for now. I don't think we will be able to increase the dose any further due to his baseline bradycardia. He will likely need an antiarrhythmic medication. I will consider Multaq or class 1 C drugs if he has no CAD.

## 2012-06-08 ENCOUNTER — Other Ambulatory Visit: Payer: Self-pay | Admitting: Cardiovascular Disease

## 2012-06-08 DIAGNOSIS — R0600 Dyspnea, unspecified: Secondary | ICD-10-CM

## 2012-06-10 ENCOUNTER — Other Ambulatory Visit (INDEPENDENT_AMBULATORY_CARE_PROVIDER_SITE_OTHER): Payer: Medicare Other

## 2012-06-10 DIAGNOSIS — Z79899 Other long term (current) drug therapy: Secondary | ICD-10-CM

## 2012-06-10 DIAGNOSIS — R002 Palpitations: Secondary | ICD-10-CM | POA: Diagnosis not present

## 2012-06-10 DIAGNOSIS — R0602 Shortness of breath: Secondary | ICD-10-CM

## 2012-06-10 DIAGNOSIS — Z01818 Encounter for other preprocedural examination: Secondary | ICD-10-CM

## 2012-06-11 ENCOUNTER — Encounter: Payer: Self-pay | Admitting: Cardiovascular Disease

## 2012-06-11 ENCOUNTER — Ambulatory Visit: Payer: Self-pay | Admitting: Cardiovascular Disease

## 2012-06-11 DIAGNOSIS — I2510 Atherosclerotic heart disease of native coronary artery without angina pectoris: Secondary | ICD-10-CM | POA: Diagnosis not present

## 2012-06-11 DIAGNOSIS — Z7982 Long term (current) use of aspirin: Secondary | ICD-10-CM | POA: Diagnosis not present

## 2012-06-11 DIAGNOSIS — I4891 Unspecified atrial fibrillation: Secondary | ICD-10-CM | POA: Diagnosis not present

## 2012-06-11 DIAGNOSIS — Z8546 Personal history of malignant neoplasm of prostate: Secondary | ICD-10-CM | POA: Diagnosis not present

## 2012-06-11 DIAGNOSIS — Z79899 Other long term (current) drug therapy: Secondary | ICD-10-CM | POA: Diagnosis not present

## 2012-06-11 DIAGNOSIS — I10 Essential (primary) hypertension: Secondary | ICD-10-CM | POA: Diagnosis not present

## 2012-06-11 DIAGNOSIS — Z87891 Personal history of nicotine dependence: Secondary | ICD-10-CM | POA: Diagnosis not present

## 2012-06-11 DIAGNOSIS — Z8711 Personal history of peptic ulcer disease: Secondary | ICD-10-CM | POA: Diagnosis not present

## 2012-06-11 DIAGNOSIS — Z800 Family history of malignant neoplasm of digestive organs: Secondary | ICD-10-CM | POA: Diagnosis not present

## 2012-06-11 DIAGNOSIS — R0609 Other forms of dyspnea: Secondary | ICD-10-CM | POA: Diagnosis not present

## 2012-06-11 DIAGNOSIS — R0602 Shortness of breath: Secondary | ICD-10-CM | POA: Diagnosis not present

## 2012-06-11 DIAGNOSIS — R002 Palpitations: Secondary | ICD-10-CM | POA: Diagnosis not present

## 2012-06-11 LAB — BASIC METABOLIC PANEL
BUN/Creatinine Ratio: 18 (ref 10–22)
BUN: 15 mg/dL (ref 8–27)
Chloride: 105 mmol/L (ref 97–108)
Creatinine, Ser: 0.84 mg/dL (ref 0.76–1.27)
GFR calc Af Amer: 97 mL/min/{1.73_m2} (ref >59)
GFR calc non Af Amer: 84 mL/min/{1.73_m2} (ref >59)

## 2012-06-11 LAB — CBC WITH DIFFERENTIAL
Basos: 0 % (ref 0–3)
Eos: 4 % (ref 0–5)
HCT: 39 % (ref 37.5–51.0)
Hemoglobin: 13.3 g/dL (ref 12.6–17.7)
Immature Grans (Abs): 0 10*3/uL (ref 0.0–0.1)
Lymphs: 19 % (ref 14–46)
Monocytes: 8 % (ref 4–12)
Neutrophils Absolute: 5.8 10*3/uL (ref 1.4–7.0)
Neutrophils Relative %: 69 % (ref 40–74)
RBC: 4.22 x10E6/uL (ref 4.14–5.80)
WBC: 8.5 10*3/uL (ref 3.4–10.8)

## 2012-06-11 LAB — PROTIME-INR
INR: 1 (ref 0.8–1.2)
Prothrombin Time: 11 s (ref 9.1–12.0)

## 2012-06-18 ENCOUNTER — Encounter: Payer: Self-pay | Admitting: Cardiovascular Disease

## 2012-06-18 ENCOUNTER — Ambulatory Visit (INDEPENDENT_AMBULATORY_CARE_PROVIDER_SITE_OTHER): Payer: Medicare Other | Admitting: Cardiovascular Disease

## 2012-06-18 VITALS — BP 120/78 | HR 54 | Ht 72.0 in | Wt 191.0 lb

## 2012-06-18 DIAGNOSIS — R0600 Dyspnea, unspecified: Secondary | ICD-10-CM

## 2012-06-18 DIAGNOSIS — R002 Palpitations: Secondary | ICD-10-CM

## 2012-06-18 DIAGNOSIS — R0609 Other forms of dyspnea: Secondary | ICD-10-CM | POA: Diagnosis not present

## 2012-06-18 DIAGNOSIS — R0989 Other specified symptoms and signs involving the circulatory and respiratory systems: Secondary | ICD-10-CM

## 2012-06-18 DIAGNOSIS — I480 Paroxysmal atrial fibrillation: Secondary | ICD-10-CM

## 2012-06-18 DIAGNOSIS — I4891 Unspecified atrial fibrillation: Secondary | ICD-10-CM | POA: Diagnosis not present

## 2012-06-18 MED ORDER — RIVAROXABAN 20 MG PO TABS: 20.0000 mg | ORAL_TABLET | Freq: Every day | ORAL | Status: DC

## 2012-06-18 NOTE — Assessment & Plan Note (Signed)
Cardiac catheterization showed no evidence of obstructive coronary artery disease.

## 2012-06-18 NOTE — Patient Instructions (Addendum)
Stop Aspirin.  Start Xarelto 20 mg once daily (blood thinner).  Can take 1 full tablet of Metoprolol as needed for palpitations.  Follow up in 3 months.

## 2012-06-18 NOTE — Assessment & Plan Note (Signed)
He is currently in normal sinus rhythm but continues to have episodes of palpitations which is likely breakthrough atrial fibrillation. These episodes are now less frequent than before and his symptoms are reasonably controlled on current dose of metoprolol. I asked him to take a full dose of metoprolol if needed. I think the biggest issue is his risk for thromboembolic complications related to her fibrillation. His [place] vas score is 2 and thus he is at moderate risk. Thus, I recommend long-term oral anticoagulation. I discussed different options with him and we decided to start Xarelto 20 mg once daily. I explained leading side effects to him. His labs before cardiac catheterization were overall unremarkable. If the patient develops recurrent symptomatic atrial fibrillation, an antiarrhythmic medication will be considered. The dose of metoprolol cannot be increased due to baseline bradycardia.

## 2012-06-18 NOTE — Progress Notes (Signed)
HPI  [name] is a pleasant 78 year old male who is here today for a followup visit regarding palpitations. He has no history of hypertension, diabetes or hyperlipidemia.He had a Holter monitor done which showed normal sinus rhythm with frequent PACs with short atrial runs and frequent PVCs. Atrial fibrillation could not be excluded. His average heart rate was 50 beats per minute with episodes of bradycardia but no prolonged pauses. He had echocardiogram done which showed normal LV systolic function with only mild mitral and tricuspid regurgitation. He continued to have palpitations and dyspnea and thus he underwent a nuclear stress test. on baseline ECG before starting the stress test, he was noted to be in atrial fibrillation. On the treadmill, he developed atrial fibrillation with rapid ventricular response with a heart rate going above 140 beats per minute with only 2 minutes of exercise. His images showed evidence of inferior wall ischemia with normal ejection fraction. He underwent cardiac catheterization which showed no evidence of obstructive coronary artery disease with an ejection fraction of 55%.  No Known Allergies   Current Outpatient Prescriptions on File Prior to Visit  Medication Sig Dispense Refill  . glucosamine-chondroitin 500-400 MG tablet Take 1 tablet by mouth daily.        . metoprolol tartrate (LOPRESSOR) 25 MG tablet Take 0.5 tablets (12.5 mg total) by mouth 2 (two) times daily.  60 tablet  6  . omeprazole (PRILOSEC) 20 MG capsule TAKE 1 CAPSULE DAILY  90 capsule  1  . Rivaroxaban (XARELTO) 20 MG TABS Take 1 tablet (20 mg total) by mouth daily.  30 tablet  6     Past Medical History  Diagnosis Date  . 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 Ulcer     gastric  . Blood in stool   . Urinary incontinence   . Prostate cancer     s/p resection  . Paroxysmal atrial fibrillation 05/28/2012     Past Surgical History  Procedure Date    . Prostatectomy 1993  . Colonoscopy 02/2012    negative pathology   . Cardiac catheterization 2013    [facility]     Family History  Problem Relation Age of Onset  . Cancer Mother     H/O colon CA  . Alcohol abuse Father   . Cancer Father     CA of tongue  . Cancer Other     colon, parents <60     History   Social History  . Marital Status: Married    Spouse Name: N/A    Number of Children: N/A  . Years of Education: N/A   Occupational History  . Retired    Social History Main Topics  . Smoking status: Former Smoker    Types: Cigarettes    Quit date: 09/15/1965  . Smokeless tobacco: Not on file  . Alcohol Use: Yes     Rare  . Drug Use: No  . Sexually Active: Not on file   Other Topics Concern  . Not on file   Social History Narrative   Married 1982.High school gradRegular exercise:  Yes, walking, golfFrom [place]     PHYSICAL EXAM   BP 120/78  Pulse 54  Ht 6' (1.829 m)  Wt 191 lb (86.637 kg)  BMI 25.90 kg/m2  Constitutional: He is oriented to person, place, and time. He appears well-developed and well-nourished. No distress.  HENT: No nasal discharge.  Head: Normocephalic and atraumatic.  Eyes:  Pupils are equal and round. Right eye exhibits no discharge. Left eye exhibits no discharge.  Neck: Normal range of motion. Neck supple. No JVD present. No thyromegaly present.  Cardiovascular: Normal rate, regular rhythm with premature beats, normal heart sounds and. Exam reveals no gallop and no friction rub. No murmur heard.  Pulmonary/Chest: Effort normal and breath sounds normal. No stridor. No respiratory distress. He has no wheezes. He has no rales. He exhibits no tenderness.  Abdominal: Soft. Bowel sounds are normal. He exhibits no distension. There is no tenderness. There is no rebound and no guarding.  Musculoskeletal: Normal range of motion. He exhibits no edema and no tenderness.  Neurological: He is alert and oriented to person, place, and time.  Coordination normal.  Skin: Skin is warm and dry. No rash noted. He is not diaphoretic. No erythema. No pallor.  Psychiatric: He has a normal mood and affect. His behavior is normal. Judgment and thought content normal.  Right radial pulse is normal with no hematoma.   [id number]  Bradycardia  -Right bundle branch block and right axis -possible right ventricular hypertrophy  -consider pulmonary disease.   ABNORMAL    ASSESSMENT AND PLAN

## 2012-07-07 DIAGNOSIS — R339 Retention of urine, unspecified: Secondary | ICD-10-CM | POA: Diagnosis not present

## 2012-07-07 DIAGNOSIS — N320 Bladder-neck obstruction: Secondary | ICD-10-CM | POA: Diagnosis not present

## 2012-07-07 DIAGNOSIS — C61 Malignant neoplasm of prostate: Secondary | ICD-10-CM | POA: Diagnosis not present

## 2012-07-07 DIAGNOSIS — N393 Stress incontinence (female) (male): Secondary | ICD-10-CM | POA: Diagnosis not present

## 2012-07-22 ENCOUNTER — Encounter: Payer: Self-pay | Admitting: Family Medicine

## 2012-07-22 ENCOUNTER — Ambulatory Visit (INDEPENDENT_AMBULATORY_CARE_PROVIDER_SITE_OTHER): Payer: Medicare Other | Admitting: Family Medicine

## 2012-07-22 VITALS — BP 108/58 | HR 56 | Temp 97.5°F | Wt 190.0 lb

## 2012-07-22 DIAGNOSIS — M720 Palmar fascial fibromatosis [Dupuytren]: Secondary | ICD-10-CM | POA: Diagnosis not present

## 2012-07-22 NOTE — Patient Instructions (Addendum)
Don't change your meds for now.  See [facility] about your referral before you leave today. Take care.   

## 2012-07-22 NOTE — Progress Notes (Signed)
Progressive L>R hand symptoms over last 6 months.  Trouble with flex and ext of L 3-5th fingers.  Dec flexion for R 3rd and 4th finger.   Meds, vitals, and allergies reviewed.   ROS: See HPI.  Otherwise, noncontributory.  nad Hands with puffiness proximal to the palmar web spaces between the 2nd and 5th digits B.   Dec in flex and ext of L 3-5th fingers.  Dec flexion for R 3rd and 4th finger.  No synovitis or erythema in hands B.  NV intact.

## 2012-07-22 NOTE — Assessment & Plan Note (Signed)
Concern for evolving dupuytren's, refer to hand clinic.  Anatomy d/w pt.  He understood.

## 2012-07-29 DIAGNOSIS — M19049 Primary osteoarthritis, unspecified hand: Secondary | ICD-10-CM | POA: Diagnosis not present

## 2012-08-03 DIAGNOSIS — M19049 Primary osteoarthritis, unspecified hand: Secondary | ICD-10-CM | POA: Diagnosis not present

## 2012-08-08 ENCOUNTER — Other Ambulatory Visit: Payer: Self-pay | Admitting: Family Medicine

## 2012-09-02 DIAGNOSIS — H01009 Unspecified blepharitis unspecified eye, unspecified eyelid: Secondary | ICD-10-CM | POA: Diagnosis not present

## 2012-09-02 DIAGNOSIS — IMO0002 Reserved for concepts with insufficient information to code with codable children: Secondary | ICD-10-CM | POA: Diagnosis not present

## 2012-09-02 DIAGNOSIS — H2510 Age-related nuclear cataract, unspecified eye: Secondary | ICD-10-CM | POA: Diagnosis not present

## 2012-09-20 ENCOUNTER — Ambulatory Visit (INDEPENDENT_AMBULATORY_CARE_PROVIDER_SITE_OTHER): Payer: Medicare Other | Admitting: Cardiovascular Disease

## 2012-09-20 ENCOUNTER — Encounter: Payer: Self-pay | Admitting: Cardiovascular Disease

## 2012-09-20 VITALS — BP 128/78 | HR 54 | Ht 72.0 in | Wt 194.5 lb

## 2012-09-20 DIAGNOSIS — R000 Tachycardia, unspecified: Secondary | ICD-10-CM

## 2012-09-20 DIAGNOSIS — I4891 Unspecified atrial fibrillation: Secondary | ICD-10-CM | POA: Diagnosis not present

## 2012-09-20 DIAGNOSIS — I480 Paroxysmal atrial fibrillation: Secondary | ICD-10-CM

## 2012-09-20 NOTE — Patient Instructions (Addendum)
Continue same medications.  Follow up in 6 months.  

## 2012-09-20 NOTE — Progress Notes (Signed)
HPI  Timothy Bullock is a pleasant 79 year old male who is here today for a followup visit regarding paroxysmal atrial fibrillation. He has no history of hypertension, diabetes or hyperlipidemia.He had a Holter monitor done which showed normal sinus rhythm with frequent PACs with short atrial runs and frequent PVCs. Atrial fibrillation could not be excluded. His average heart rate was 50 beats per minute with episodes of bradycardia but no prolonged pauses. He had echocardiogram done which showed normal LV systolic function with only mild mitral and tricuspid regurgitation. He continued to have palpitations and dyspnea and thus he underwent a nuclear stress test. on baseline ECG before starting the stress test, he was noted to be in atrial fibrillation. On the treadmill, he developed atrial fibrillation with rapid ventricular response with a heart rate going above 140 beats per minute with only 2 minutes of exercise. His images showed evidence of inferior wall ischemia with normal ejection fraction. He underwent cardiac catheterization which showed no evidence of obstructive coronary artery disease with an ejection fraction of 55%. He has been tolerating anticoagulation with Xarelto. He gets palpitations on an average of once every 2 weeks. He takes an extra dose of metoprolol with improvement. He is able to do his activities and play golf without significant exertional symptoms of palpitations.  No Known Allergies   Current Outpatient Prescriptions on File Prior to Visit  Medication Sig Dispense Refill  . metoprolol tartrate (LOPRESSOR) 25 MG tablet Take 0.5 tablets (12.5 mg total) by mouth 2 (two) times daily.  60 tablet  6  . omeprazole (PRILOSEC) 20 MG capsule TAKE 1 CAPSULE DAILY  90 capsule  3  . Rivaroxaban (XARELTO) 20 MG TABS Take 1 tablet (20 mg total) by mouth daily.  30 tablet  6     Past Medical History  Diagnosis Date  . Blood transfusion     Autologous donation with prostate  surgery  . Ulcer     gastric  . Blood in stool   . Urinary incontinence   . Prostate cancer     s/p resection  . Paroxysmal atrial fibrillation 05/28/2012     Past Surgical History  Procedure Date  . Prostatectomy 1993  . Colonoscopy 02/2012    negative pathology   . Cardiac catheterization 2013    [facility]     Family History  Problem Relation Age of Onset  . Cancer Mother     H/O colon CA  . Alcohol abuse Father   . Cancer Father     CA of tongue  . Cancer Other     colon, parents <60     History   Social History  . Marital Status: Married    Spouse Name: N/A    Number of Children: N/A  . Years of Education: N/A   Occupational History  . Retired    Social History Main Topics  . Smoking status: Former Smoker    Types: Cigarettes    Quit date: 09/15/1965  . Smokeless tobacco: Not on file  . Alcohol Use: Yes     Comment: Rare  . Drug Use: No  . Sexually Active: Not on file   Other Topics Concern  . Not on file   Social History Narrative   Married 1982.High school gradRegular exercise:  Yes, walking, golfFrom [place]     PHYSICAL EXAM   BP 128/78  Pulse 54  Ht 6' (1.829 m)  Wt 194 lb 8 oz (88.225 kg)  BMI 26.38 kg/m2  Constitutional: He is oriented to person, place, and time. He appears well-developed and well-nourished. No distress.  HENT: No nasal discharge.  Head: Normocephalic and atraumatic.  Eyes: Pupils are equal and round. Right eye exhibits no discharge. Left eye exhibits no discharge.  Neck: Normal range of motion. Neck supple. No JVD present. No thyromegaly present.  Cardiovascular: Normal rate, regular rhythm with premature beats, normal heart sounds and. Exam reveals no gallop and no friction rub. No murmur heard.  Pulmonary/Chest: Effort normal and breath sounds normal. No stridor. No respiratory distress. He has no wheezes. He has no rales. He exhibits no tenderness.  Abdominal: Soft. Bowel sounds are normal. He exhibits no  distension. There is no tenderness. There is no rebound and no guarding.  Musculoskeletal: Normal range of motion. He exhibits no edema and no tenderness.  Neurological: He is alert and oriented to person, place, and time. Coordination normal.  Skin: Skin is warm and dry. No rash noted. He is not diaphoretic. No erythema. No pallor.  Psychiatric: He has a normal mood and affect. His behavior is normal. Judgment and thought content normal.     [id number]  Bradycardia  -Right bundle branch block.   ABNORMAL    ASSESSMENT AND PLAN

## 2012-09-20 NOTE — Assessment & Plan Note (Signed)
He is currently in normal sinus rhythm but continues to have episodes of palpitations which is likely breakthrough atrial fibrillation. On average of once every 2 weeks.  These episodes respond to taking one full tablet of metoprolol 25 mg. Continue current management. If atrial fibrillation burden increases, an antiarrhythmic medication such as class IC drugs can be considered. Continue anticoagulation with Xarelto which seems to be well tolerated so for.  I will repeat CBC and basic metabolic profile upon followup in 6 months.

## 2012-10-26 DIAGNOSIS — D485 Neoplasm of uncertain behavior of skin: Secondary | ICD-10-CM | POA: Diagnosis not present

## 2012-10-26 DIAGNOSIS — L723 Sebaceous cyst: Secondary | ICD-10-CM | POA: Diagnosis not present

## 2012-10-26 DIAGNOSIS — L821 Other seborrheic keratosis: Secondary | ICD-10-CM | POA: Diagnosis not present

## 2012-10-26 DIAGNOSIS — C44319 Basal cell carcinoma of skin of other parts of face: Secondary | ICD-10-CM | POA: Diagnosis not present

## 2012-10-26 DIAGNOSIS — L570 Actinic keratosis: Secondary | ICD-10-CM | POA: Diagnosis not present

## 2013-01-24 ENCOUNTER — Other Ambulatory Visit: Payer: Self-pay | Admitting: Cardiovascular Disease

## 2013-02-23 ENCOUNTER — Other Ambulatory Visit: Payer: Self-pay | Admitting: *Deleted

## 2013-02-23 MED ORDER — METOPROLOL TARTRATE 25 MG PO TABS: 12.5000 mg | ORAL_TABLET | Freq: Two times a day (BID) | ORAL | Status: DC

## 2013-02-23 NOTE — Telephone Encounter (Signed)
Refilled Metoprolol sent to [facility]. 

## 2013-02-24 ENCOUNTER — Other Ambulatory Visit: Payer: Self-pay | Admitting: *Deleted

## 2013-02-24 MED ORDER — METOPROLOL TARTRATE 25 MG PO TABS: 12.5000 mg | ORAL_TABLET | Freq: Two times a day (BID) | ORAL | Status: DC

## 2013-02-24 NOTE — Telephone Encounter (Signed)
Resent refill for [facility]oprolol to [facility] due to 1st refill sent not going thru.

## 2013-03-09 ENCOUNTER — Telehealth: Payer: Self-pay

## 2013-03-09 ENCOUNTER — Emergency Department: Payer: Self-pay | Admitting: Emergency Medicine

## 2013-03-09 DIAGNOSIS — R319 Hematuria, unspecified: Secondary | ICD-10-CM | POA: Diagnosis not present

## 2013-03-09 DIAGNOSIS — Z79899 Other long term (current) drug therapy: Secondary | ICD-10-CM | POA: Diagnosis not present

## 2013-03-09 DIAGNOSIS — I4891 Unspecified atrial fibrillation: Secondary | ICD-10-CM | POA: Diagnosis not present

## 2013-03-09 LAB — CBC
HCT: 37.7 % — ABNORMAL LOW (ref 40.0–52.0)
HGB: 13 g/dL (ref 13.0–18.0)
MCH: 30.9 pg (ref 26.0–34.0)
MCHC: 34.6 g/dL (ref 32.0–36.0)
MCV: 89 fL (ref 80–100)
Platelet: 263 10*3/uL (ref 150–440)
RBC: 4.22 10*6/uL — ABNORMAL LOW (ref 4.40–5.90)

## 2013-03-09 LAB — URINALYSIS, COMPLETE
Bilirubin,UR: NEGATIVE
Glucose,UR: NEGATIVE mg/dL (ref 0–75)
Ketone: NEGATIVE
Protein: 30
Squamous Epithelial: <1

## 2013-03-09 LAB — PROTIME-INR: Prothrombin Time: 24.5 secs — ABNORMAL HIGH (ref 11.5–14.7)

## 2013-03-09 LAB — COMPREHENSIVE METABOLIC PANEL
Anion Gap: 7 (ref 7–16)
BUN: 21 mg/dL — ABNORMAL HIGH (ref 7–18)
Chloride: 112 mmol/L — ABNORMAL HIGH (ref 98–107)
Co2: 22 mmol/L (ref 21–32)
Creatinine: 0.88 mg/dL (ref 0.60–1.30)
EGFR (African American): >60
EGFR (Non-African Amer.): >60
Osmolality: 285 (ref 275–301)
SGPT (ALT): 22 U/L (ref 12–78)
Total Protein: 6.8 g/dL (ref 6.4–8.2)

## 2013-03-09 NOTE — Telephone Encounter (Signed)
Pt states that he went to ed this a.m. For urine discoloration. Pt was advised to call us, (pt of dr Kirke Corin) pt is on xarelto. Please call

## 2013-03-09 NOTE — Telephone Encounter (Signed)
Check UA, CBC and BMP is not already done.

## 2013-03-09 NOTE — Telephone Encounter (Signed)
Pt says he went to [facility] ER this am for c/io brown urine Is taking Ovidio Hanger U/A negative for infection Was told by ER MD it may be blood secondary to Xarelto and advised he call our office to get further advice  He denies blood in BM, coughing up blood, etc I will forward to Dr. Kirke Corin for his opinion and call pt back

## 2013-03-10 ENCOUNTER — Other Ambulatory Visit: Payer: Self-pay

## 2013-03-10 MED ORDER — ASPIRIN EC 81 MG PO TBEC: 81.0000 mg | DELAYED_RELEASE_TABLET | Freq: Every day | ORAL | Status: DC

## 2013-03-10 NOTE — Telephone Encounter (Signed)
Pt stopped by office Says he is going out of town x 5 days beginning tomm and would like answer I reassured him this is not an emergency and Dr. Kirke Corin is reviewing all the results from ER visit and we will call him with response He gave me his cell # to call in case it is not until tomm when he is out of town I will call him back at [phone]

## 2013-03-10 NOTE — Telephone Encounter (Signed)
These were already done at [facility] Will have them scanned in  H/H=13/37.7 Wbc=8.5 Plt=263  U/a=3+ blood, protein 30 mg, rbc 286, wbc 5, trace bacteria  Bun/creat=21/0.88 K=4.1

## 2013-03-10 NOTE — Telephone Encounter (Signed)
Hold Xarelto for now and start Aspirin 81 mg daily. He needs to see a urologist. Having blood in urine is not normal even if he is on a blood thinner. We will likely resume Xarelto once we know the reason for hematuria.

## 2013-03-10 NOTE — Telephone Encounter (Signed)
Pt is calling back. Awaits advice on Xarelto

## 2013-03-10 NOTE — Telephone Encounter (Signed)
Pt informed Understanding verb Is established with Dr. Achilles Dunk and will call him for an appt He will let us know when reason for hematuria is discovered and will resume Xarelto at that time

## 2013-03-10 NOTE — Telephone Encounter (Signed)
Please advise 

## 2013-03-17 DIAGNOSIS — R310 Gross hematuria: Secondary | ICD-10-CM | POA: Diagnosis not present

## 2013-03-17 DIAGNOSIS — N390 Urinary tract infection, site not specified: Secondary | ICD-10-CM | POA: Diagnosis not present

## 2013-03-21 ENCOUNTER — Encounter: Payer: Self-pay | Admitting: *Deleted

## 2013-03-22 ENCOUNTER — Encounter: Payer: Self-pay | Admitting: Cardiovascular Disease

## 2013-03-22 ENCOUNTER — Ambulatory Visit (INDEPENDENT_AMBULATORY_CARE_PROVIDER_SITE_OTHER): Payer: Medicare Other | Admitting: Cardiovascular Disease

## 2013-03-22 VITALS — BP 142/70 | HR 54 | Ht 72.0 in | Wt 191.5 lb

## 2013-03-22 DIAGNOSIS — I480 Paroxysmal atrial fibrillation: Secondary | ICD-10-CM

## 2013-03-22 DIAGNOSIS — I4891 Unspecified atrial fibrillation: Secondary | ICD-10-CM

## 2013-03-22 NOTE — Patient Instructions (Addendum)
Continue same medications.  Follow up in 3 months.  

## 2013-03-22 NOTE — Progress Notes (Signed)
HPI  Timothy Bullock is a pleasant 79 year old male who is here today for a followup visit regarding paroxysmal atrial fibrillation. He has no history of hypertension, diabetes or hyperlipidemia. He had a Holter monitor done which showed normal sinus rhythm with frequent PACs with short atrial runs of frequent PVCs. Atrial fibrillation could not be excluded. His average heart rate was 50 beats per minute with episodes of bradycardia but no prolonged pauses. He had echocardiogram done which showed normal LV systolic function with only mild mitral and tricuspid regurgitation. He continued to have palpitations and dyspnea and thus he underwent a nuclear stress test. on baseline ECG before starting the stress test, he was noted to be in atrial fibrillation. On the treadmill, he developed atrial fibrillation with rapid ventricular response with a heart rate going above 140 beats per minute with only 2 minutes of exercise. His images showed evidence of inferior wall ischemia with normal ejection fraction. He underwent cardiac catheterization in 05/2012 which showed no evidence of obstructive coronary artery disease with an ejection fraction of 55%. He was started on  Xarelto. He noticed hematuria 3 weeks ago and went to the emergency room at [facility]. Urinalysis confirmed RBCs. CBC showed no significant anemia. He does have previous history of prostate cancer status post prostatectomy. I asked him to followup with Dr. Achilles Dunk; his urologist for evaluation of this. Xarelto was stopped and switched to aspirin daily. He reports resolution of hematuria.  No Known Allergies   Current Outpatient Prescriptions on File Prior to Visit  Medication Sig Dispense Refill  . aspirin EC 81 MG tablet Take 1 tablet (81 mg total) by mouth daily.  90 tablet  3  . metoprolol tartrate (LOPRESSOR) 25 MG tablet Take 0.5 tablets (12.5 mg total) by mouth 2 (two) times daily.  60 tablet  6  . omeprazole (PRILOSEC) 20 MG capsule TAKE 1  CAPSULE DAILY  90 capsule  3   No current facility-administered medications on file prior to visit.     Past Medical History  Diagnosis Date  . 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 Ulcer     gastric  . Blood in stool   . Urinary incontinence   . Prostate cancer     s/p resection  . Paroxysmal atrial fibrillation 05/28/2012     Past Surgical History  Procedure Laterality Date  . Prostatectomy  1993  . Colonoscopy  02/2012    negative pathology   . Cardiac catheterization  2013    [facility]     Family History  Problem Relation Age of Onset  . Cancer Mother     H/O colon CA  . Alcohol abuse Father   . Cancer Father     CA of tongue  . Cancer Other     colon, parents <60     History   Social History  . Marital Status: Married    Spouse Name: N/A    Number of Children: N/A  . Years of Education: N/A   Occupational History  . Retired    Social History Main Topics  . Smoking status: Former Smoker    Types: Cigarettes    Quit date: 09/15/1965  . Smokeless tobacco: Not on file  . Alcohol Use: Yes     Comment: Rare  . Drug Use: No  . Sexually Active: Not on file   Other Topics Concern  . Not on file   Social History Narrative  Married 1982.   High school grad   Regular exercise:  Yes, walking, golf   From [place]     PHYSICAL EXAM   BP 142/70  Pulse 54  Ht 6' (1.829 m)  Wt 191 lb 8 oz (86.864 kg)  BMI 25.97 kg/m2  Constitutional: He is oriented to person, place, and time. He appears well-developed and well-nourished. No distress.  HENT: No nasal discharge.  Head: Normocephalic and atraumatic.  Eyes: Pupils are equal and round. Right eye exhibits no discharge. Left eye exhibits no discharge.  Neck: Normal range of motion. Neck supple. No JVD present. No thyromegaly present.  Cardiovascular: Normal rate, regular rhythm with premature beats, normal heart sounds and. Exam reveals no gallop and no friction rub. No  murmur heard.  Pulmonary/Chest: Effort normal and breath sounds normal. No stridor. No respiratory distress. He has no wheezes. He has no rales. He exhibits no tenderness.  Abdominal: Soft. Bowel sounds are normal. He exhibits no distension. There is no tenderness. There is no rebound and no guarding.  Musculoskeletal: Normal range of motion. He exhibits no edema and no tenderness.  Neurological: He is alert and oriented to person, place, and time. Coordination normal.  Skin: Skin is warm and dry. No rash noted. He is not diaphoretic. No erythema. No pallor.  Psychiatric: He has a normal mood and affect. His behavior is normal. Judgment and thought content normal.     HQI:[id number]  Bradycardia with a PVC -Right bundle branch block.   ABNORMAL    ASSESSMENT AND PLAN

## 2013-03-22 NOTE — Assessment & Plan Note (Signed)
His symptoms are reasonably well controlled on current dose of metoprolol. He continues to have occasional palpitations but no prolonged tachycardia. Xarelto was stopped due to gross hematuria. I will request the most recent evaluation by Dr. Achilles Dunk. We need to establish if it's safe to resume anticoagulation. For now, continue aspirin daily.

## 2013-03-24 ENCOUNTER — Encounter: Payer: Self-pay | Admitting: *Deleted

## 2013-03-29 ENCOUNTER — Other Ambulatory Visit: Payer: Self-pay | Admitting: *Deleted

## 2013-03-29 MED ORDER — METOPROLOL TARTRATE 25 MG PO TABS: 25.0000 mg | ORAL_TABLET | Freq: Two times a day (BID) | ORAL | Status: DC

## 2013-03-29 NOTE — Telephone Encounter (Signed)
See below

## 2013-03-29 NOTE — Telephone Encounter (Signed)
Pharmacy called to say pt is requesting a refill of metoprolol 25 mg tablets, says this was increased from 1/2 tablet to 1 whole tablet BID. I do not see this documented in chart I attempted to call pt to discuss but there was no answer. Will try again later

## 2013-03-29 NOTE — Telephone Encounter (Signed)
Patient is taking a whole tablet instead of half

## 2013-03-29 NOTE — Telephone Encounter (Signed)
He should monitor his HR closely because of his known bradycardia.

## 2013-03-29 NOTE — Telephone Encounter (Signed)
I spoke with pt who confirms he is taking metoprolol 1 tablet BID He says he discussed this with Dr. Kirke Corin at last OV I told him I will send in RX refill and will make Dr. Kirke Corin aware

## 2013-03-30 NOTE — Telephone Encounter (Signed)
Pt informed to monitor HR due to bradycardia. Pt will keep log and will contact office if he has any other problems.

## 2013-04-05 ENCOUNTER — Encounter: Payer: Self-pay | Admitting: Family Medicine

## 2013-04-05 ENCOUNTER — Ambulatory Visit (INDEPENDENT_AMBULATORY_CARE_PROVIDER_SITE_OTHER): Payer: Medicare Other | Admitting: Family Medicine

## 2013-04-05 VITALS — BP 102/60 | HR 62 | Temp 98.0°F | Wt 189.8 lb

## 2013-04-05 DIAGNOSIS — I480 Paroxysmal atrial fibrillation: Secondary | ICD-10-CM

## 2013-04-05 DIAGNOSIS — I4891 Unspecified atrial fibrillation: Secondary | ICD-10-CM

## 2013-04-05 DIAGNOSIS — R82998 Other abnormal findings in urine: Secondary | ICD-10-CM

## 2013-04-05 DIAGNOSIS — R8290 Unspecified abnormal findings in urine: Secondary | ICD-10-CM

## 2013-04-05 LAB — URINALYSIS, ROUTINE W REFLEX MICROSCOPIC
Bilirubin Urine: NEGATIVE
Ketones, ur: 15
Leukocytes, UA: NEGATIVE
Specific Gravity, Urine: 1.01 (ref 1.000–1.030)
Total Protein, Urine: NEGATIVE
Urine Glucose: NEGATIVE
pH: 6 (ref 5.0–8.0)

## 2013-04-05 NOTE — Patient Instructions (Addendum)
Go to the lab on the way out.  Ask Terri about getting the urine container.  We'll contact you with your lab report.  Take care.

## 2013-04-05 NOTE — Progress Notes (Signed)
To summarize- was on xarelto and had hematuria.  Ucx was prev noted.  He has no urinary sx and wasn't on abx.  Still on metoprolol and ASA.  No gross hematuria now.  He had been seen at Dr. W[facility].  Has f/u with Dr. Achilles Dunk schedule for later in the fall.   He feels well.  He can feel an episode of AF when it happens, but that is rare and short lived.   Meds, vitals, and allergies reviewed.   ROS: See HPI.  Otherwise, noncontributory.  GEN: nad, alert and oriented HEENT: mucous membranes moist NECK: supple w/o LA CV: rrr.  PULM: ctab, no inc wob ABD: soft, +bs EXT: no edema SKIN: no acute rash

## 2013-04-06 LAB — URINE CULTURE: Colony Count: 10000

## 2013-04-06 NOTE — Assessment & Plan Note (Signed)
With hematuria when on xarelto. Now resolved w/o gross hematuria.  Given his recent ucx, repeat today and we'll go from there.  See notes on labs.  Discussed.  He agrees.

## 2013-04-25 DIAGNOSIS — L02239 Carbuncle of trunk, unspecified: Secondary | ICD-10-CM | POA: Diagnosis not present

## 2013-04-25 DIAGNOSIS — L723 Sebaceous cyst: Secondary | ICD-10-CM | POA: Diagnosis not present

## 2013-06-17 ENCOUNTER — Ambulatory Visit (INDEPENDENT_AMBULATORY_CARE_PROVIDER_SITE_OTHER): Payer: Medicare Other | Admitting: Family Medicine

## 2013-06-17 ENCOUNTER — Encounter: Payer: Self-pay | Admitting: Family Medicine

## 2013-06-17 VITALS — BP 130/68 | Temp 98.4°F | Wt 184.5 lb

## 2013-06-17 DIAGNOSIS — B029 Zoster without complications: Secondary | ICD-10-CM

## 2013-06-17 MED ORDER — VALACYCLOVIR HCL 1 G PO TABS: 1000.0000 mg | ORAL_TABLET | Freq: Three times a day (TID) | ORAL | Status: DC

## 2013-06-17 MED ORDER — HYDROCODONE-ACETAMINOPHEN 5-325 MG PO TABS: 1.0000 | ORAL_TABLET | Freq: Three times a day (TID) | ORAL | Status: DC | PRN

## 2013-06-17 NOTE — Progress Notes (Signed)
Rash on L lower abd.  Itches.  Not painful.  Started about 3-4 days ago.  No R sided sx.  No FCNAVD.    Meds, vitals, and allergies reviewed.   ROS: See HPI.  Otherwise, noncontributory.  nad L lower abd with dermatomal rash.

## 2013-06-17 NOTE — Patient Instructions (Addendum)
Take claritin for itching if needed.  Start the valtrex today and take it 3 times a day.  Use the pain medicine if needed.

## 2013-06-17 NOTE — Assessment & Plan Note (Signed)
No pain, hold hydrocodone in case.  Start valtrex.  Likely minimal sx other than rash due to prev vaccine.  Path/phys d/w pt.   F/u prn.

## 2013-06-23 ENCOUNTER — Ambulatory Visit: Payer: Medicare Other | Admitting: Family Medicine

## 2013-06-23 ENCOUNTER — Encounter: Payer: Self-pay | Admitting: Family Medicine

## 2013-06-23 ENCOUNTER — Ambulatory Visit (INDEPENDENT_AMBULATORY_CARE_PROVIDER_SITE_OTHER): Payer: Medicare Other | Admitting: Family Medicine

## 2013-06-23 VITALS — BP 106/58 | HR 61 | Temp 98.0°F | Wt 188.0 lb

## 2013-06-23 DIAGNOSIS — Z23 Encounter for immunization: Secondary | ICD-10-CM

## 2013-06-23 DIAGNOSIS — B029 Zoster without complications: Secondary | ICD-10-CM

## 2013-06-23 NOTE — Patient Instructions (Addendum)
Finish valtrex. Let us know if persistent shingles pain, or if new tender spots. Continue course as up to now. Good to meet you today, call us with questions. Flu shot today.

## 2013-06-23 NOTE — Assessment & Plan Note (Signed)
rec finish valtrex course.  Don't think this is spread of shingles. ?right chest papular rash due to bug bites like ?chiggers/mosquito bites. Discussed gabapentin, pt prefers to avoid for now. Rec return if further spreading of left abd rash.

## 2013-06-23 NOTE — Progress Notes (Signed)
  Subjective:    Patient ID: Timothy Bullock, male    DOB: July 21, 1932, 79 y.o.   MRN: [id number]  HPI CC: shingles?  Pleasant 79yo pt of Dr D who was seen here 06/16/2013 with dx shingles - treated with 7 d course of valtrex 1gm tid as well as hydrocodone for breakthrough pain 79 presents today 2/2 concerns for continued spread of rash.  Last night 79 was first time he slept well 2/2 itching and pain.  Seems to be getting better - redness improving.  Has 1 more day of valtrex left.  Has not been taking hydrocodone.  Did receive zostavax 2012.  New itchy spots on right chest over last 2 days.  Did go golfing this week, may have been exposed to bugs at that time.  Past Medical History  Diagnosis Date  . 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 Ulcer     gastric  . Blood in stool   . Urinary incontinence   . Prostate cancer     s/p resection  . Paroxysmal atrial fibrillation 05/28/2012     Review of Systems Per HPI    Objective:   Physical Exam  Nursing note and vitals reviewed. Constitutional: He appears well-developed and well-nourished. No distress.  Skin: Skin is warm and dry. Rash noted. There is erythema.     Resolving erythematous dermatomal rash on left lower abdomen extending to back, papules never vesicles. Newer papular rash on right chest, pruritic.  nonvesicular.  Psychiatric: He has a normal mood and affect.       Assessment & Plan:

## 2013-06-27 ENCOUNTER — Ambulatory Visit (INDEPENDENT_AMBULATORY_CARE_PROVIDER_SITE_OTHER): Payer: Medicare Other | Admitting: Cardiovascular Disease

## 2013-06-27 ENCOUNTER — Encounter: Payer: Self-pay | Admitting: Cardiovascular Disease

## 2013-06-27 VITALS — BP 124/68 | HR 49 | Ht 72.0 in | Wt 191.5 lb

## 2013-06-27 DIAGNOSIS — I4891 Unspecified atrial fibrillation: Secondary | ICD-10-CM | POA: Diagnosis not present

## 2013-06-27 DIAGNOSIS — I480 Paroxysmal atrial fibrillation: Secondary | ICD-10-CM

## 2013-06-27 NOTE — Patient Instructions (Signed)
Continue same medications.  Check with Dr. Achilles Dunk if OK to resume the blood thinner.   Record how often and how long you are having fast heart beats.   Follow up in 3 months.

## 2013-06-27 NOTE — Assessment & Plan Note (Signed)
He continues to be in sinus rhythm with overall low burden of atrial fibrillation. CHADS2 VASc score is 2 due to age. Thus, he is at an overall low to moderate risk of cardioembolic complications. I had a prolonged discussion with him about this issue. He already had hematuria while he was on [place]. He reports negative workup by urology. It also appears that he had previous blood in stool in the past and 2013 with negative colonoscopy. Thus, he might be at a slightly higher risk of bleeding complications. Risks and benefits were discussed extensively. For now, he will be continued on aspirin. I asked him to check with Dr. Achilles Dunk about the safety of anticoagulation in terms of recurrent hematuria. I also asked him to monitor his symptoms and record the frequency of palpitations .

## 2013-06-27 NOTE — Progress Notes (Signed)
HPI  [name] is a pleasant 79 year old male who is here today for a followup visit regarding paroxysmal atrial fibrillation. He has no history of hypertension, diabetes or hyperlipidemia. He had a Holter monitor done which showed normal sinus rhythm with frequent PACs with short atrial runs of frequent PVCs. Atrial fibrillation could not be excluded. His average heart rate was 50 beats per minute with episodes of bradycardia but no prolonged pauses. He had echocardiogram done which showed normal LV systolic function with only mild mitral and tricuspid regurgitation. He continued to have palpitations and dyspnea and thus he underwent a nuclear stress test. on baseline ECG before starting the stress test, he was noted to be in atrial fibrillation. On the treadmill, he developed atrial fibrillation with rapid ventricular response with a heart rate going above 140 beats per minute with only 2 minutes of exercise. His images showed evidence of inferior wall ischemia with normal ejection fraction. He underwent cardiac catheterization in 05/2012 which showed no evidence of obstructive coronary artery disease with an ejection fraction of 55%. He was started on  Xarelto but developed hematuria which resolved after stopping Milinda Hirschfeld. He was evaluated by Dr. Achilles Dunk and was told about no significant abnormalities. He has been doing reasonably well. He had an episode of palpitation which lasted about one hour few weeks ago. This was his only notable episode of palpitation. He tends to be bradycardic at baseline. Heart rate rarely goes above 100 even with exercise. He had shingles recently and was treated. He feels better.    No Known Allergies   Current Outpatient Prescriptions on File Prior to Visit  Medication Sig Dispense Refill  . aspirin EC 81 MG tablet Take 1 tablet (81 mg total) by mouth daily.  90 tablet  3  . metoprolol tartrate (LOPRESSOR) 25 MG tablet Take 1 tablet (25 mg total) by mouth 2 (two)  times daily.  60 tablet  6  . omeprazole (PRILOSEC) 20 MG capsule TAKE 1 CAPSULE DAILY  90 capsule  3   No current facility-administered medications on file prior to visit.     Past Medical History  Diagnosis Date  . 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 Ulcer     gastric  . Blood in stool   . Urinary incontinence   . Prostate cancer     s/p resection  . Paroxysmal atrial fibrillation 05/28/2012     Past Surgical History  Procedure Laterality Date  . Prostatectomy  1993  . Colonoscopy  02/2012    negative pathology   . Cardiac catheterization  2013    [facility]     Family History  Problem Relation Age of Onset  . Cancer Mother     H/O colon CA  . Alcohol abuse Father   . Cancer Father     CA of tongue  . Cancer Other     colon, parents <60     History   Social History  . Marital Status: Married    Spouse Name: N/A    Number of Children: N/A  . Years of Education: N/A   Occupational History  . Retired    Social History Main Topics  . Smoking status: Former Smoker    Types: Cigarettes    Quit date: 09/15/1965  . Smokeless tobacco: Not on file  . Alcohol Use: Yes     Comment: Rare  . Drug Use: No  . Sexual Activity: Not on file  Other Topics Concern  . Not on file   Social History Narrative   Married 1982.   High school grad   Regular exercise:  Yes, walking, golf   From [place]     PHYSICAL EXAM   BP 124/68  Pulse 49  Ht 6' (1.829 m)  Wt 191 lb 8 oz (86.864 kg)  BMI 25.97 kg/m2  Constitutional: He is oriented to person, place, and time. He appears well-developed and well-nourished. No distress.  HENT: No nasal discharge.  Head: Normocephalic and atraumatic.  Eyes: Pupils are equal and round. Right eye exhibits no discharge. Left eye exhibits no discharge.  Neck: Normal range of motion. Neck supple. No JVD present. No thyromegaly present.  Cardiovascular: Normal rate, regular rhythm with premature beats,  normal heart sounds and. Exam reveals no gallop and no friction rub. No murmur heard.  Pulmonary/Chest: Effort normal and breath sounds normal. No stridor. No respiratory distress. He has no wheezes. He has no rales. He exhibits no tenderness.  Abdominal: Soft. Bowel sounds are normal. He exhibits no distension. There is no tenderness. There is no rebound and no guarding.  Musculoskeletal: Normal range of motion. He exhibits no edema and no tenderness.  Neurological: He is alert and oriented to person, place, and time. Coordination normal.  Skin: Skin is warm and dry. No rash noted. He is not diaphoretic. No erythema. No pallor.  Psychiatric: He has a normal mood and affect. His behavior is normal. Judgment and thought content normal.     WUJ:[id number]  Bradycardia with a PVC -Right bundle branch block.   ABNORMAL    ASSESSMENT AND PLAN

## 2013-07-13 DIAGNOSIS — R339 Retention of urine, unspecified: Secondary | ICD-10-CM | POA: Diagnosis not present

## 2013-07-13 DIAGNOSIS — N393 Stress incontinence (female) (male): Secondary | ICD-10-CM | POA: Diagnosis not present

## 2013-07-13 DIAGNOSIS — Z8546 Personal history of malignant neoplasm of prostate: Secondary | ICD-10-CM | POA: Diagnosis not present

## 2013-07-13 DIAGNOSIS — R310 Gross hematuria: Secondary | ICD-10-CM | POA: Diagnosis not present

## 2013-07-19 DIAGNOSIS — R310 Gross hematuria: Secondary | ICD-10-CM | POA: Diagnosis not present

## 2013-09-27 ENCOUNTER — Other Ambulatory Visit: Payer: Self-pay | Admitting: Family Medicine

## 2013-09-27 ENCOUNTER — Ambulatory Visit: Payer: Medicare Other | Admitting: Cardiovascular Disease

## 2013-10-25 ENCOUNTER — Ambulatory Visit: Payer: Medicare Other | Admitting: Cardiovascular Disease

## 2013-11-03 ENCOUNTER — Ambulatory Visit (INDEPENDENT_AMBULATORY_CARE_PROVIDER_SITE_OTHER): Payer: Medicare Other | Admitting: Cardiovascular Disease

## 2013-11-03 ENCOUNTER — Encounter: Payer: Self-pay | Admitting: Cardiovascular Disease

## 2013-11-03 VITALS — BP 121/66 | HR 55 | Ht 72.0 in | Wt 193.5 lb

## 2013-11-03 DIAGNOSIS — I4891 Unspecified atrial fibrillation: Secondary | ICD-10-CM | POA: Diagnosis not present

## 2013-11-03 DIAGNOSIS — I480 Paroxysmal atrial fibrillation: Secondary | ICD-10-CM

## 2013-11-03 DIAGNOSIS — I498 Other specified cardiac arrhythmias: Secondary | ICD-10-CM | POA: Diagnosis not present

## 2013-11-03 DIAGNOSIS — R001 Bradycardia, unspecified: Secondary | ICD-10-CM

## 2013-11-03 MED ORDER — APIXABAN 5 MG PO TABS: 5.0000 mg | ORAL_TABLET | Freq: Two times a day (BID) | ORAL | Status: DC

## 2013-11-03 NOTE — Assessment & Plan Note (Signed)
He continues to be in sinus rhythm with overall low burden of atrial fibrillation. CHADS2 VASc score is 2 due to age. Thus, he is at an overall low to moderate risk of cardioembolic complications. he reports no further hematuria. We discussed today resuming anticoagulation. He is very concerned about resuming Xarelto but is willing to try different agents Thus, I started Eliquis 5 mg twice daily. Check CBC and basic metabolic profile today.

## 2013-11-03 NOTE — Progress Notes (Signed)
HPI  Timothy Bullock is a pleasant 80 year old male who is here today for a followup visit regarding paroxysmal atrial fibrillation and bradycardia. He has no history of hypertension, diabetes or hyperlipidemia. He had a Holter monitor done in 2013 which showed normal sinus rhythm with frequent PACs with short atrial runs and frequent PVCs. Atrial fibrillation could not be excluded. His average heart rate was 50 beats per minute with episodes of bradycardia but no prolonged pauses. He had echocardiogram done which showed normal LV systolic function with only mild mitral and tricuspid regurgitation. He continued to have palpitations and dyspnea and thus he underwent a nuclear stress test in 05/2012. on baseline ECG before starting the stress test, he was noted to be in atrial fibrillation. On the treadmill, he developed atrial fibrillation with rapid ventricular response with a heart rate going above 140 beats per minute with only 2 minutes of exercise. His images showed evidence of inferior wall ischemia with normal ejection fraction. He underwent cardiac catheterization in 05/2012 which showed no evidence of obstructive coronary artery disease with an ejection fraction of 55%. He was started on  Xarelto but developed hematuria which resolved after stopping [name]. He was evaluated by Dr. Jacqlyn Larsen and was told about no significant abnormalities. He is currently taking aspirin daily. Overall, he is doing reasonably well and denies dizziness, syncope or presyncope.    No Known Allergies   Current Outpatient Prescriptions on File Prior to Visit  Medication Sig Dispense Refill  . aspirin EC 81 MG tablet Take 1 tablet (81 mg total) by mouth daily.  90 tablet  3  . metoprolol tartrate (LOPRESSOR) 25 MG tablet Take 1 tablet (25 mg total) by mouth 2 (two) times daily.  60 tablet  6  . omeprazole (PRILOSEC) 20 MG capsule TAKE 1 CAPSULE DAILY  90 capsule  2   No current facility-administered medications on file  prior to visit.     Past Medical History  Diagnosis Date  . 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 Ulcer     gastric  . Blood in stool   . Urinary incontinence   . Prostate cancer     s/p resection  . Paroxysmal atrial fibrillation 05/28/2012     Past Surgical History  Procedure Laterality Date  . Prostatectomy  1993  . Colonoscopy  02/2012    negative pathology   . Cardiac catheterization  2013    [facility]     Family History  Problem Relation Age of Onset  . Cancer Mother     H/O colon CA  . Alcohol abuse Father   . Cancer Father     CA of tongue  . Cancer Other     colon, parents <60     History   Social History  . Marital Status: Married    Spouse Name: N/A    Number of Children: N/A  . Years of Education: N/A   Occupational History  . Retired    Social History Main Topics  . Smoking status: Former Smoker    Types: Cigarettes    Quit date: 09/15/1965  . Smokeless tobacco: Not on file  . Alcohol Use: Yes     Comment: Rare  . Drug Use: No  . Sexual Activity: Not on file   Other Topics Concern  . Not on file   Social History Narrative   Married 1982.   High school grad   Regular exercise:  Yes, walking,  golf   From [place]     PHYSICAL EXAM   BP 121/66  Pulse 55  Ht 6' (1.829 m)  Wt 193 lb 8 oz (87.771 kg)  BMI 26.24 kg/m2  Constitutional: He is oriented to person, place, and time. He appears well-developed and well-nourished. No distress.  HENT: No nasal discharge.  Head: Normocephalic and atraumatic.  Eyes: Pupils are equal and round. Right eye exhibits no discharge. Left eye exhibits no discharge.  Neck: Normal range of motion. Neck supple. No JVD present. No thyromegaly present.  Cardiovascular: Normal rate, regular rhythm with premature beats, normal heart sounds and. Exam reveals no gallop and no friction rub. No murmur heard.  Pulmonary/Chest: Effort normal and breath sounds normal. No stridor. No  respiratory distress. He has no wheezes. He has no rales. He exhibits no tenderness.  Abdominal: Soft. Bowel sounds are normal. He exhibits no distension. There is no tenderness. There is no rebound and no guarding.  Musculoskeletal: Normal range of motion. He exhibits no edema and no tenderness.  Neurological: He is alert and oriented to person, place, and time. Coordination normal.  Skin: Skin is warm and dry. No rash noted. He is not diaphoretic. No erythema. No pallor.  Psychiatric: He has a normal mood and affect. His behavior is normal. Judgment and thought content normal.     PYP:PJKDT  Bradycardia  - frequent ectopic ventricular beat s  # VECs = 3 -Right bundle branch block and right axis -possible right ventricular hypertrophy  -consider pulmonary disease or posterior fasicular block.   ABNORMAL    ASSESSMENT AND PLAN

## 2013-11-03 NOTE — Assessment & Plan Note (Signed)
He seems to be stable on small dose metoprolol 25 mg once daily with no reported dizziness, syncope or presyncope. Continue to monitor. He might require a permanent pacemaker placement in the future.

## 2013-11-03 NOTE — Patient Instructions (Signed)
Stop taking Aspirin.  Start Eliquis 5 mg twice daily.   Labs today.   Your physician wants you to follow-up in: 6 months.  You will receive a reminder letter in the mail two months in advance. If you don't receive a letter, please call our office to schedule the follow-up appointment.

## 2013-11-04 LAB — BASIC METABOLIC PANEL
BUN / CREAT RATIO: 22 (ref 10–22)
BUN: 21 mg/dL (ref 8–27)
CALCIUM: 9.5 mg/dL (ref 8.6–10.2)
CO2: 24 mmol/L (ref 18–29)
Chloride: 103 mmol/L (ref 97–108)
Creatinine, Ser: 0.94 mg/dL (ref 0.76–1.27)
GFR, EST AFRICAN AMERICAN: 89 mL/min/{1.73_m2} (ref >59)
GFR, EST NON AFRICAN AMERICAN: 77 mL/min/{1.73_m2} (ref >59)
Glucose: 93 mg/dL (ref 65–99)
POTASSIUM: 4.7 mmol/L (ref 3.5–5.2)
SODIUM: 141 mmol/L (ref 134–144)

## 2013-11-04 LAB — CBC WITH DIFFERENTIAL/PLATELET
BASOS: 0 %
Basophils Absolute: 0 10*3/uL (ref 0.0–0.2)
EOS ABS: 0.4 10*3/uL (ref 0.0–0.4)
Eos: 5 %
HCT: 39.3 % (ref 37.5–51.0)
Hemoglobin: 13.2 g/dL (ref 12.6–17.7)
IMMATURE GRANS (ABS): 0 10*3/uL (ref 0.0–0.1)
Immature Granulocytes: 0 %
LYMPHS ABS: 2.3 10*3/uL (ref 0.7–3.1)
Lymphs: 29 %
MCH: 30 pg (ref 26.6–33.0)
MCHC: 33.6 g/dL (ref 31.5–35.7)
MCV: 89 fL (ref 79–97)
MONOS ABS: 0.9 10*3/uL (ref 0.1–0.9)
Monocytes: 11 %
NEUTROS PCT: 55 %
Neutrophils Absolute: 4.3 10*3/uL (ref 1.4–7.0)
RBC: 4.4 x10E6/uL (ref 4.14–5.80)
RDW: 13.8 % (ref 12.3–15.4)
WBC: 7.9 10*3/uL (ref 3.4–10.8)

## 2013-11-10 ENCOUNTER — Other Ambulatory Visit: Payer: Self-pay | Admitting: Cardiovascular Disease

## 2013-11-11 ENCOUNTER — Other Ambulatory Visit: Payer: Self-pay | Admitting: *Deleted

## 2013-11-11 MED ORDER — METOPROLOL TARTRATE 25 MG PO TABS: 25.0000 mg | ORAL_TABLET | Freq: Two times a day (BID) | ORAL | Status: DC

## 2013-11-11 NOTE — Telephone Encounter (Signed)
Requested Prescriptions   Signed Prescriptions Disp Refills  . metoprolol tartrate (LOPRESSOR) 25 MG tablet 60 tablet 6    Sig: Take 1 tablet (25 mg total) by mouth 2 (two) times daily.    Authorizing Provider: Minna Merritts    Ordering User: Britt Bottom

## 2013-12-21 DIAGNOSIS — R339 Retention of urine, unspecified: Secondary | ICD-10-CM | POA: Diagnosis not present

## 2013-12-21 DIAGNOSIS — R310 Gross hematuria: Secondary | ICD-10-CM | POA: Diagnosis not present

## 2013-12-21 DIAGNOSIS — N2889 Other specified disorders of kidney and ureter: Secondary | ICD-10-CM | POA: Insufficient documentation

## 2013-12-21 DIAGNOSIS — Z8546 Personal history of malignant neoplasm of prostate: Secondary | ICD-10-CM | POA: Diagnosis not present

## 2013-12-21 DIAGNOSIS — N393 Stress incontinence (female) (male): Secondary | ICD-10-CM | POA: Diagnosis not present

## 2013-12-21 DIAGNOSIS — N289 Disorder of kidney and ureter, unspecified: Secondary | ICD-10-CM | POA: Diagnosis not present

## 2013-12-29 DIAGNOSIS — K5730 Diverticulosis of large intestine without perforation or abscess without bleeding: Secondary | ICD-10-CM | POA: Diagnosis not present

## 2013-12-29 DIAGNOSIS — R310 Gross hematuria: Secondary | ICD-10-CM | POA: Diagnosis not present

## 2013-12-29 DIAGNOSIS — K449 Diaphragmatic hernia without obstruction or gangrene: Secondary | ICD-10-CM | POA: Diagnosis not present

## 2014-02-28 ENCOUNTER — Ambulatory Visit (INDEPENDENT_AMBULATORY_CARE_PROVIDER_SITE_OTHER): Payer: Medicare Other | Admitting: Family Medicine

## 2014-02-28 ENCOUNTER — Encounter: Payer: Self-pay | Admitting: Family Medicine

## 2014-02-28 ENCOUNTER — Encounter (INDEPENDENT_AMBULATORY_CARE_PROVIDER_SITE_OTHER): Payer: Self-pay

## 2014-02-28 VITALS — BP 122/60 | HR 70 | Temp 97.8°F | Wt 193.2 lb

## 2014-02-28 DIAGNOSIS — R599 Enlarged lymph nodes, unspecified: Secondary | ICD-10-CM

## 2014-02-28 DIAGNOSIS — Z87448 Personal history of other diseases of urinary system: Secondary | ICD-10-CM | POA: Diagnosis not present

## 2014-02-28 DIAGNOSIS — R591 Generalized enlarged lymph nodes: Secondary | ICD-10-CM

## 2014-02-28 NOTE — Progress Notes (Signed)
Pre visit review using our clinic review tool, if applicable. No additional management support is needed unless otherwise documented below in the visit note.  Discomfort in the neck, anterior. Only on the L side. It is a little uncomfortable but only when he presses the area.  No pain with ROM of the neck. He didn't feel a specific lump or mass. No rash, no skin changes.  He noted it when it was checking his pulse in his neck.  Feels okay o/w. No fevers, no URI sx.  Feels well.   He has f/u with Dr. Jacqlyn Larsen pending.  H/o hematuria.  Has seen uro prev.  Still with some occ lower abdomen discomfort but no dysuria.    Meds, vitals, and allergies reviewed.   ROS: See HPI.  Otherwise, noncontributory.  GEN: nad, alert and oriented HEENT: mucous membranes moist, TM wnl, nasal and OP exam wnl NECK: supple w/possible 1 small lymph node noted, not ttp, just posterior to the midpoint of the L jaw line, <1cm diameter, not ttp, no other masses or LA on exam, no supraclavicular LA CV: IRR PULM: ctab, no inc wob ABD: soft, +bs EXT: no edema SKIN: no acute rash

## 2014-02-28 NOTE — Patient Instructions (Signed)
We'll contact you with your lab report.  As long at the tenderness gets better, we don't need to do anything else.  If you note more discomfort or anything else, then notify me.  Take care.  Glad to see you.

## 2014-03-01 DIAGNOSIS — R591 Generalized enlarged lymph nodes: Secondary | ICD-10-CM | POA: Insufficient documentation

## 2014-03-01 DIAGNOSIS — Z87448 Personal history of other diseases of urinary system: Secondary | ICD-10-CM | POA: Insufficient documentation

## 2014-03-01 LAB — URINALYSIS, MICROSCOPIC ONLY: RBC / HPF: NONE SEEN (ref 0–?)

## 2014-03-01 NOTE — Assessment & Plan Note (Signed)
Possible LA, small, isolated, no other sx.  Would follow clinically and if persisting then notify us.  He agrees.

## 2014-03-01 NOTE — Assessment & Plan Note (Signed)
See notes on urine sample.

## 2014-03-15 DIAGNOSIS — R339 Retention of urine, unspecified: Secondary | ICD-10-CM | POA: Diagnosis not present

## 2014-03-15 DIAGNOSIS — Z8546 Personal history of malignant neoplasm of prostate: Secondary | ICD-10-CM | POA: Diagnosis not present

## 2014-03-15 DIAGNOSIS — R310 Gross hematuria: Secondary | ICD-10-CM | POA: Diagnosis not present

## 2014-03-15 DIAGNOSIS — N393 Stress incontinence (female) (male): Secondary | ICD-10-CM | POA: Diagnosis not present

## 2014-04-20 ENCOUNTER — Ambulatory Visit (INDEPENDENT_AMBULATORY_CARE_PROVIDER_SITE_OTHER): Payer: Medicare Other | Admitting: Cardiovascular Disease

## 2014-04-20 ENCOUNTER — Encounter: Payer: Self-pay | Admitting: Cardiovascular Disease

## 2014-04-20 VITALS — BP 118/62 | HR 51 | Ht 72.0 in | Wt 192.5 lb

## 2014-04-20 DIAGNOSIS — I480 Paroxysmal atrial fibrillation: Secondary | ICD-10-CM

## 2014-04-20 DIAGNOSIS — I4891 Unspecified atrial fibrillation: Secondary | ICD-10-CM

## 2014-04-20 DIAGNOSIS — I498 Other specified cardiac arrhythmias: Secondary | ICD-10-CM | POA: Diagnosis not present

## 2014-04-20 DIAGNOSIS — R001 Bradycardia, unspecified: Secondary | ICD-10-CM

## 2014-04-20 NOTE — Patient Instructions (Signed)
Your physician wants you to follow-up in: 6 months  You will receive a reminder letter in the mail two months in advance. If you don't receive a letter, please call our office to schedule the follow-up appointment.  Your physician recommends that you continue on your current medications as directed. Please refer to the Current Medication list given to you today.  

## 2014-04-20 NOTE — Progress Notes (Signed)
HPI  Timothy Bullock is a pleasant 80 year old male who is here today for a followup visit regarding paroxysmal atrial fibrillation and bradycardia. He has no history of hypertension, diabetes or hyperlipidemia. He had a Holter monitor done in 2013 which showed normal sinus rhythm with frequent PACs with short atrial runs and frequent PVCs. Atrial fibrillation could not be excluded. His average heart rate was 50 beats per minute with episodes of bradycardia but no prolonged pauses. He had echocardiogram done which showed normal LV systolic function with only mild mitral and tricuspid regurgitation. He continued to have palpitations and dyspnea and thus he underwent a nuclear stress test in 05/2012. on baseline ECG before starting the stress test, he was noted to be in atrial fibrillation. On the treadmill, he developed atrial fibrillation with rapid ventricular response with a heart rate going above 140 beats per minute with only 2 minutes of exercise. His images showed evidence of inferior wall ischemia with normal ejection fraction. He underwent cardiac catheterization in 05/2012 which showed no evidence of obstructive coronary artery disease with an ejection fraction of 55%. He was started on  Xarelto but developed hematuria which resolved after stopping Timothy Bullock. He was evaluated by Dr. Jacqlyn Larsen and was told about no significant abnormalities. During last visit, I started him on Eliquis. He had symptoms of headache and vertigo with it and decided to stop medication. Otherwise he has been doing well.    No Known Allergies   Current Outpatient Prescriptions on File Prior to Visit  Medication Sig Dispense Refill  . aspirin 81 MG tablet Take 81 mg by mouth daily.      Marland Kitchen omeprazole (PRILOSEC) 20 MG capsule TAKE 1 CAPSULE DAILY  90 capsule  2   No current facility-administered medications on file prior to visit.     Past Medical History  Diagnosis Date  . Blood transfusion     Autologous donation with  prostate surgery  . Ulcer     gastric  . Blood in stool   . Urinary incontinence   . Prostate cancer     s/p resection  . Paroxysmal atrial fibrillation 05/28/2012     Past Surgical History  Procedure Laterality Date  . Prostatectomy  1993  . Colonoscopy  02/2012    negative pathology   . Cardiac catheterization  2013    [facility]     Family History  Problem Relation Age of Onset  . Cancer Mother     H/O colon CA  . Alcohol abuse Father   . Cancer Father     CA of tongue  . Cancer Other     colon, parents <60     History   Social History  . Marital Status: Married    Spouse Name: N/A    Number of Children: N/A  . Years of Education: N/A   Occupational History  . Retired    Social History Main Topics  . Smoking status: Former Smoker    Types: Cigarettes    Quit date: 09/15/1965  . Smokeless tobacco: Not on file  . Alcohol Use: Yes     Comment: Rare  . Drug Use: No  . Sexual Activity: Not on file   Other Topics Concern  . Not on file   Social History Narrative   Married 1982.   High school grad   Regular exercise:  Yes, walking, golf   From [place]     PHYSICAL EXAM   BP 118/62  Pulse 51  Ht 6' (1.829 m)  Wt 192 lb 8 oz (87.317 kg)  BMI 26.10 kg/m2  Constitutional: He is oriented to person, place, and time. He appears well-developed and well-nourished. No distress.  HENT: No nasal discharge.  Head: Normocephalic and atraumatic.  Eyes: Pupils are equal and round. Right eye exhibits no discharge. Left eye exhibits no discharge.  Neck: Normal range of motion. Neck supple. No JVD present. No thyromegaly present.  Cardiovascular: Normal rate, regular rhythm with premature beats, normal heart sounds and. Exam reveals no gallop and no friction rub. No murmur heard.  Pulmonary/Chest: Effort normal and breath sounds normal. No stridor. No respiratory distress. He has no wheezes. He has no rales. He exhibits no tenderness.  Abdominal: Soft. Bowel  sounds are normal. He exhibits no distension. There is no tenderness. There is no rebound and no guarding.  Musculoskeletal: Normal range of motion. He exhibits no edema and no tenderness.  Neurological: He is alert and oriented to person, place, and time. Coordination normal.  Skin: Skin is warm and dry. No rash noted. He is not diaphoretic. No erythema. No pallor.  Psychiatric: He has a normal mood and affect. His behavior is normal. Judgment and thought content normal.     JFH:LKTGY  Bradycardia  - frequent ectopic ventricular beat s  # VECs = 2 -Right bundle branch block.   ABNORMAL    ASSESSMENT AND PLAN

## 2014-04-20 NOTE — Assessment & Plan Note (Signed)
He is overall asymptomatic.

## 2014-04-20 NOTE — Assessment & Plan Note (Signed)
He continues to be in sinus rhythm with overall low burden of atrial fibrillation. CHADS2 VASc score is 2 due to age. Thus, he is at an overall low to moderate risk of cardioembolic complications. He had hematuria with Xarelto and reported side effects with  Eliquis. He is currently on aspirin daily.  Continue current medications for now. If he develops recurrent atrial fibrillation, anticoagulation with warfarin is recommended.

## 2014-06-01 ENCOUNTER — Other Ambulatory Visit: Payer: Self-pay | Admitting: Family Medicine

## 2014-06-30 ENCOUNTER — Other Ambulatory Visit: Payer: Self-pay

## 2014-06-30 MED ORDER — METOPROLOL TARTRATE 25 MG PO TABS: 25.0000 mg | ORAL_TABLET | Freq: Two times a day (BID) | ORAL | Status: DC

## 2014-06-30 NOTE — Telephone Encounter (Signed)
Refill sent for metoprolol tart

## 2014-07-04 ENCOUNTER — Other Ambulatory Visit: Payer: Self-pay | Admitting: *Deleted

## 2014-07-04 MED ORDER — METOPROLOL TARTRATE 25 MG PO TABS: 25.0000 mg | ORAL_TABLET | Freq: Two times a day (BID) | ORAL | Status: DC

## 2014-07-20 DIAGNOSIS — N393 Stress incontinence (female) (male): Secondary | ICD-10-CM | POA: Diagnosis not present

## 2014-07-20 DIAGNOSIS — N320 Bladder-neck obstruction: Secondary | ICD-10-CM | POA: Diagnosis not present

## 2014-07-20 DIAGNOSIS — R310 Gross hematuria: Secondary | ICD-10-CM | POA: Diagnosis not present

## 2014-07-20 DIAGNOSIS — Z8546 Personal history of malignant neoplasm of prostate: Secondary | ICD-10-CM | POA: Diagnosis not present

## 2014-07-20 DIAGNOSIS — D414 Neoplasm of uncertain behavior of bladder: Secondary | ICD-10-CM | POA: Diagnosis not present

## 2014-08-17 ENCOUNTER — Ambulatory Visit (INDEPENDENT_AMBULATORY_CARE_PROVIDER_SITE_OTHER): Payer: Medicare Other

## 2014-08-17 DIAGNOSIS — Z23 Encounter for immunization: Secondary | ICD-10-CM

## 2014-11-07 ENCOUNTER — Encounter: Payer: Self-pay | Admitting: Cardiovascular Disease

## 2014-11-07 ENCOUNTER — Ambulatory Visit (INDEPENDENT_AMBULATORY_CARE_PROVIDER_SITE_OTHER): Payer: Medicare Other | Admitting: Cardiovascular Disease

## 2014-11-07 VITALS — BP 118/62 | HR 86 | Ht 74.0 in | Wt 193.5 lb

## 2014-11-07 DIAGNOSIS — I480 Paroxysmal atrial fibrillation: Secondary | ICD-10-CM

## 2014-11-07 MED ORDER — APIXABAN 5 MG PO TABS: 5.0000 mg | ORAL_TABLET | Freq: Two times a day (BID) | ORAL | Status: DC

## 2014-11-07 NOTE — Patient Instructions (Signed)
Stop Aspirin.   Start taking Eliquis 5 mg twice daily.   Follow up in 3 months.

## 2014-11-07 NOTE — Assessment & Plan Note (Signed)
The patient might be transitioning to chronic atrial fibrillation. Ventricular rate is controlled on metoprolol. I again discussed with him the indication for anticoagulation and recommend resuming some form of anticoagulation. I discussed the different options and decided to start Eliquis 5 mg twice daily. I will plan on checking his labs in 3 months. I discussed risks and benefits of anticoagulation extensively.

## 2014-11-07 NOTE — Progress Notes (Signed)
HPI  Timothy Bullock is a pleasant 81 year old male who is here today for a followup visit regarding paroxysmal atrial fibrillation and bradycardia. He has no history of hypertension, diabetes or hyperlipidemia. He had a Holter monitor done in 2013 which showed normal sinus rhythm with frequent PACs with short atrial runs and frequent PVCs. Atrial fibrillation could not be excluded. His average heart rate was 50 beats per minute with episodes of bradycardia but no prolonged pauses. He had echocardiogram done which showed normal LV systolic function with only mild mitral and tricuspid regurgitation. He continued to have palpitations and dyspnea and thus he underwent a nuclear stress test in 05/2012. on baseline ECG before starting the stress test, he was noted to be in atrial fibrillation. On the treadmill, he developed atrial fibrillation with rapid ventricular response with a heart rate going above 140 beats per minute with only 2 minutes of exercise. His images showed evidence of inferior wall ischemia with normal ejection fraction. He underwent cardiac catheterization in 05/2012 which showed no evidence of obstructive coronary artery disease with an ejection fraction of 55%. He was started on  Xarelto but developed hematuria which resolved after stopping Timothy Bullock. He was evaluated by Dr. Jacqlyn Larsen and was told about no significant abnormalities. During last visit, I started him on Eliquis. He reports that he never took Eliquis due to concerns about anticoagulation in general. He is noted to be in atrial fibrillation today but has no symptoms related to that.   No Known Allergies   Current Outpatient Prescriptions on File Prior to Visit  Medication Sig Dispense Refill  . aspirin 81 MG tablet Take 81 mg by mouth daily.    . metoprolol tartrate (LOPRESSOR) 25 MG tablet Take 1 tablet (25 mg total) by mouth 2 (two) times daily. 180 tablet 3  . omeprazole (PRILOSEC) 20 MG capsule TAKE 1 CAPSULE DAILY 90 capsule  1   No current facility-administered medications on file prior to visit.     Past Medical History  Diagnosis Date  . 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 Ulcer     gastric  . Blood in stool   . Urinary incontinence   . Prostate cancer     s/p resection  . Paroxysmal atrial fibrillation 05/28/2012     Past Surgical History  Procedure Laterality Date  . Prostatectomy  1993  . Colonoscopy  02/2012    negative pathology   . Cardiac catheterization  2013    [facility]     Family History  Problem Relation Age of Onset  . Cancer Mother     H/O colon CA  . Alcohol abuse Father   . Cancer Father     CA of tongue  . Cancer Other     colon, parents <60     History   Social History  . Marital Status: Married    Spouse Name: N/A  . Number of Children: N/A  . Years of Education: N/A   Occupational History  . Retired    Social History Main Topics  . Smoking status: Former Smoker    Types: Cigarettes    Quit date: 09/15/1965  . Smokeless tobacco: Not on file  . Alcohol Use: Yes     Comment: Rare  . Drug Use: No  . Sexual Activity: Not on file   Other Topics Concern  . Not on file   Social History Narrative   Married 1982.   High school grad  Regular exercise:  Yes, walking, golf   From [place]     PHYSICAL EXAM   BP 118/62 mmHg  Pulse 86  Ht 6\' 2"  (1.88 m)  Wt 193 lb 8 oz (87.771 kg)  BMI 24.83 kg/m2  Constitutional: He is oriented to person, place, and time. He appears well-developed and well-nourished. No distress.  HENT: No nasal discharge.  Head: Normocephalic and atraumatic.  Eyes: Pupils are equal and round. Right eye exhibits no discharge. Left eye exhibits no discharge.  Neck: Normal range of motion. Neck supple. No JVD present. No thyromegaly present.  Cardiovascular: Normal rate, regular rhythm with premature beats, normal heart sounds and. Exam reveals no gallop and no friction rub. No murmur heard.    Pulmonary/Chest: Effort normal and breath sounds normal. No stridor. No respiratory distress. He has no wheezes. He has no rales. He exhibits no tenderness.  Abdominal: Soft. Bowel sounds are normal. He exhibits no distension. There is no tenderness. There is no rebound and no guarding.  Musculoskeletal: Normal range of motion. He exhibits no edema and no tenderness.  Neurological: He is alert and oriented to person, place, and time. Coordination normal.  Skin: Skin is warm and dry. No rash noted. He is not diaphoretic. No erythema. No pallor.  Psychiatric: He has a normal mood and affect. His behavior is normal. Judgment and thought content normal.     EKG: Atrial flutter-fibrillation  - frequent ectopic ventricular beat s  # VECs = 2 -Right bundle branch block.   ABNORMAL     ASSESSMENT AND PLAN

## 2014-11-08 ENCOUNTER — Other Ambulatory Visit: Payer: Self-pay | Admitting: Family Medicine

## 2015-02-06 ENCOUNTER — Ambulatory Visit (INDEPENDENT_AMBULATORY_CARE_PROVIDER_SITE_OTHER): Payer: Medicare Other | Admitting: Cardiovascular Disease

## 2015-02-06 ENCOUNTER — Encounter: Payer: Self-pay | Admitting: Cardiovascular Disease

## 2015-02-06 VITALS — BP 110/80 | HR 85 | Ht 72.0 in | Wt 189.5 lb

## 2015-02-06 DIAGNOSIS — I482 Chronic atrial fibrillation, unspecified: Secondary | ICD-10-CM | POA: Insufficient documentation

## 2015-02-06 DIAGNOSIS — I480 Paroxysmal atrial fibrillation: Secondary | ICD-10-CM

## 2015-02-06 DIAGNOSIS — I10 Essential (primary) hypertension: Secondary | ICD-10-CM | POA: Diagnosis not present

## 2015-02-06 NOTE — Progress Notes (Signed)
HPI  Timothy Bullock is a pleasant 81 year old male who is here today for a followup visit regarding chronic atrial fibrillation and bradycardia. He has no history of hypertension, diabetes or hyperlipidemia. He had a Holter monitor done in 2013 which showed normal sinus rhythm with frequent PACs with short atrial runs and frequent PVCs. Atrial fibrillation could not be excluded. His average heart rate was 50 beats per minute with episodes of bradycardia but no prolonged pauses. He had echocardiogram done which showed normal LV systolic function with only mild mitral and tricuspid regurgitation. He continued to have palpitations and dyspnea and thus he underwent a nuclear stress test in 05/2012. on baseline ECG before starting the stress test, he was noted to be in atrial fibrillation. On the treadmill, he developed atrial fibrillation with rapid ventricular response with a heart rate going above 140 beats per minute with only 2 minutes of exercise. His images showed evidence of inferior wall ischemia with normal ejection fraction. He underwent cardiac catheterization in 05/2012 which showed no evidence of obstructive coronary artery disease with an ejection fraction of 55%. He was started on  Xarelto but developed hematuria which resolved after stopping [name]. He was evaluated by Dr. Jacqlyn Larsen and was told about no significant abnormalities. During last visit, I started him on Eliquis. He reports no bleeding complications. He denies chest pain, shortness of breath or palpitations.    No Known Allergies   Current Outpatient Prescriptions on File Prior to Visit  Medication Sig Dispense Refill  . apixaban (ELIQUIS) 5 MG TABS tablet Take 1 tablet (5 mg total) by mouth 2 (two) times daily. 60 tablet 6  . metoprolol tartrate (LOPRESSOR) 25 MG tablet Take 1 tablet (25 mg total) by mouth 2 (two) times daily. 180 tablet 3  . omeprazole (PRILOSEC) 20 MG capsule TAKE 1 CAPSULE DAILY 90 capsule 1   No current  facility-administered medications on file prior to visit.     Past Medical History  Diagnosis Date  . 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 Ulcer     gastric  . Blood in stool   . Urinary incontinence   . Prostate cancer     s/p resection  . Paroxysmal atrial fibrillation 05/28/2012     Past Surgical History  Procedure Laterality Date  . Prostatectomy  1993  . Colonoscopy  02/2012    negative pathology   . Cardiac catheterization  2013    [facility]     Family History  Problem Relation Age of Onset  . Cancer Mother     H/O colon CA  . Alcohol abuse Father   . Cancer Father     CA of tongue  . Cancer Other     colon, parents <60     History   Social History  . Marital Status: Married    Spouse Name: N/A  . Number of Children: N/A  . Years of Education: N/A   Occupational History  . Retired    Social History Main Topics  . Smoking status: Former Smoker    Types: Cigarettes    Quit date: 09/15/1965  . Smokeless tobacco: Not on file  . Alcohol Use: Yes     Comment: Rare  . Drug Use: No  . Sexual Activity: Not on file   Other Topics Concern  . Not on file   Social History Narrative   Married 1982.   High school grad   Regular exercise:  Yes,  walking, golf   From [place]     PHYSICAL EXAM   BP 110/80 mmHg  Pulse 85  Ht 6' (1.829 m)  Wt 189 lb 8 oz (85.957 kg)  BMI 25.70 kg/m2  Constitutional: He is oriented to person, place, and time. He appears well-developed and well-nourished. No distress.  HENT: No nasal discharge.  Head: Normocephalic and atraumatic.  Eyes: Pupils are equal and round. Right eye exhibits no discharge. Left eye exhibits no discharge.  Neck: Normal range of motion. Neck supple. No JVD present. No thyromegaly present.  Cardiovascular: Normal rate, irregular rhythm with premature beats, normal heart sounds and. Exam reveals no gallop and no friction rub. No murmur heard.  Pulmonary/Chest: Effort  normal and breath sounds normal. No stridor. No respiratory distress. He has no wheezes. He has no rales. He exhibits no tenderness.  Abdominal: Soft. Bowel sounds are normal. He exhibits no distension. There is no tenderness. There is no rebound and no guarding.  Musculoskeletal: Normal range of motion. He exhibits no edema and no tenderness.  Neurological: He is alert and oriented to person, place, and time. Coordination normal.  Skin: Skin is warm and dry. No rash noted. He is not diaphoretic. No erythema. No pallor.  Psychiatric: He has a normal mood and affect. His behavior is normal. Judgment and thought content normal.     EKG: Atrial fibrillation  - occasional ectopic ventricular beat    -Right bundle branch block.   ABNORMAL     ASSESSMENT AND PLAN

## 2015-02-06 NOTE — Patient Instructions (Signed)
Medication Instructions:  Your physician recommends that you continue on your current medications as directed. Please refer to the Current Medication list given to you today.   Labwork: Your physician recommends that you have labs today: BMET, CBC   Testing/Procedures: None  Follow-Up: Your physician wants you to follow-up in: [place] with Dr. Fletcher Anon.  You will receive a reminder letter in the mail two months in advance. If you don't receive a letter, please call our office to schedule the follow-up appointment.   Any Other Special Instructions Will Be Listed Below (If Applicable).

## 2015-02-06 NOTE — Assessment & Plan Note (Signed)
The patient now has chronic atrial fibrillation for which I recommend rate control and long-term anticoagulation. Ventricular rate seems to be reasonably controlled on current dose of metoprolol. He is tolerating anticoagulation with Eliquis with no reported side effects.  I requested routine labs today including CBC and basic metabolic profile

## 2015-02-07 LAB — BASIC METABOLIC PANEL
BUN/Creatinine Ratio: 16 (ref 10–22)
BUN: 16 mg/dL (ref 8–27)
CHLORIDE: 102 mmol/L (ref 97–108)
CO2: 21 mmol/L (ref 18–29)
Calcium: 9.3 mg/dL (ref 8.6–10.2)
Creatinine, Ser: 1.03 mg/dL (ref 0.76–1.27)
GFR calc Af Amer: 79 mL/min/{1.73_m2} (ref >59)
GFR calc non Af Amer: 68 mL/min/{1.73_m2} (ref >59)
Glucose: 101 mg/dL — ABNORMAL HIGH (ref 65–99)
Potassium: 4.6 mmol/L (ref 3.5–5.2)
Sodium: 140 mmol/L (ref 134–144)

## 2015-02-07 LAB — CBC
Hematocrit: 39.9 % (ref 37.5–51.0)
Hemoglobin: 13.7 g/dL (ref 12.6–17.7)
MCH: 30.8 pg (ref 26.6–33.0)
MCHC: 34.3 g/dL (ref 31.5–35.7)
MCV: 90 fL (ref 79–97)
PLATELETS: 280 10*3/uL (ref 150–379)
RBC: 4.45 x10E6/uL (ref 4.14–5.80)
RDW: 14.1 % (ref 12.3–15.4)
WBC: 7.3 10*3/uL (ref 3.4–10.8)

## 2015-02-08 DIAGNOSIS — C44519 Basal cell carcinoma of skin of other part of trunk: Secondary | ICD-10-CM | POA: Diagnosis not present

## 2015-02-08 DIAGNOSIS — L821 Other seborrheic keratosis: Secondary | ICD-10-CM | POA: Diagnosis not present

## 2015-02-08 DIAGNOSIS — Z85828 Personal history of other malignant neoplasm of skin: Secondary | ICD-10-CM | POA: Diagnosis not present

## 2015-02-08 DIAGNOSIS — L570 Actinic keratosis: Secondary | ICD-10-CM | POA: Diagnosis not present

## 2015-02-08 DIAGNOSIS — D485 Neoplasm of uncertain behavior of skin: Secondary | ICD-10-CM | POA: Diagnosis not present

## 2015-05-06 ENCOUNTER — Other Ambulatory Visit: Payer: Self-pay | Admitting: Family Medicine

## 2015-06-07 DIAGNOSIS — H00012 Hordeolum externum right lower eyelid: Secondary | ICD-10-CM | POA: Diagnosis not present

## 2015-06-07 DIAGNOSIS — H25043 Posterior subcapsular polar age-related cataract, bilateral: Secondary | ICD-10-CM | POA: Diagnosis not present

## 2015-06-07 DIAGNOSIS — H2513 Age-related nuclear cataract, bilateral: Secondary | ICD-10-CM | POA: Diagnosis not present

## 2015-06-07 DIAGNOSIS — H01021 Squamous blepharitis right upper eyelid: Secondary | ICD-10-CM | POA: Diagnosis not present

## 2015-06-19 ENCOUNTER — Ambulatory Visit (INDEPENDENT_AMBULATORY_CARE_PROVIDER_SITE_OTHER): Payer: Medicare Other | Admitting: Family Medicine

## 2015-06-19 ENCOUNTER — Encounter: Payer: Self-pay | Admitting: Family Medicine

## 2015-06-19 ENCOUNTER — Telehealth: Payer: Self-pay

## 2015-06-19 VITALS — BP 108/52 | HR 73 | Temp 97.7°F | Wt 182.0 lb

## 2015-06-19 DIAGNOSIS — R1032 Left lower quadrant pain: Secondary | ICD-10-CM | POA: Diagnosis not present

## 2015-06-19 LAB — COMPREHENSIVE METABOLIC PANEL
ALK PHOS: 53 U/L (ref 39–117)
ALT: 11 U/L (ref 0–53)
AST: 14 U/L (ref 0–37)
Albumin: 3.8 g/dL (ref 3.5–5.2)
BILIRUBIN TOTAL: 0.5 mg/dL (ref 0.2–1.2)
BUN: 20 mg/dL (ref 6–23)
CO2: 29 mEq/L (ref 19–32)
CREATININE: 0.9 mg/dL (ref 0.40–1.50)
Calcium: 9.2 mg/dL (ref 8.4–10.5)
Chloride: 103 mEq/L (ref 96–112)
GFR: 86.04 mL/min (ref >60.00)
GLUCOSE: 93 mg/dL (ref 70–99)
Potassium: 4.3 mEq/L (ref 3.5–5.1)
SODIUM: 139 meq/L (ref 135–145)
TOTAL PROTEIN: 7.1 g/dL (ref 6.0–8.3)

## 2015-06-19 LAB — CBC WITH DIFFERENTIAL/PLATELET
Basophils Absolute: 0 10*3/uL (ref 0.0–0.1)
Basophils Relative: 0.3 % (ref 0.0–3.0)
Eosinophils Absolute: 0.2 10*3/uL (ref 0.0–0.7)
Eosinophils Relative: 2.4 % (ref 0.0–5.0)
HCT: 42.2 % (ref 39.0–52.0)
Hemoglobin: 14.2 g/dL (ref 13.0–17.0)
Lymphocytes Relative: 16.6 % (ref 12.0–46.0)
Lymphs Abs: 1.7 10*3/uL (ref 0.7–4.0)
MCHC: 33.8 g/dL (ref 30.0–36.0)
MCV: 92.3 fl (ref 78.0–100.0)
Monocytes Absolute: 1 10*3/uL (ref 0.1–1.0)
Monocytes Relative: 9.4 % (ref 3.0–12.0)
Neutro Abs: 7.4 10*3/uL (ref 1.4–7.7)
Neutrophils Relative %: 71.3 % (ref 43.0–77.0)
Platelets: 285 10*3/uL (ref 150.0–400.0)
RBC: 4.57 Mil/uL (ref 4.22–5.81)
RDW: 13.6 % (ref 11.5–15.5)
WBC: 10.3 10*3/uL (ref 4.0–10.5)

## 2015-06-19 MED ORDER — METRONIDAZOLE 500 MG PO TABS: 500.0000 mg | ORAL_TABLET | Freq: Three times a day (TID) | ORAL | Status: DC

## 2015-06-19 MED ORDER — CIPROFLOXACIN HCL 500 MG PO TABS: 500.0000 mg | ORAL_TABLET | Freq: Two times a day (BID) | ORAL | Status: DC

## 2015-06-19 NOTE — Telephone Encounter (Signed)
Pt walked in with lt side pain that started 06/17/15; pt said pain mostly constant and on pain scale 7 at 8:30 AM. Pt has had some constipation and hx of diverticulosis. No fever. Pt does not appear in distress.Pt was offered 8:45 appt but pt said wanted to wait to see Dr Damita Dunnings. Pt did agree if pain worsened prior to appt to cb to [facility] or go to [facility]. Pt voiced understanding.pt has appt with Dr Damita Dunnings 06/19/15 at 10:30 Am

## 2015-06-19 NOTE — Progress Notes (Signed)
Pre visit review using our clinic review tool, if applicable. No additional management support is needed unless otherwise documented below in the visit note.  LLQ abd pain.  Present for about 4 days.  He felt diffusely weak with the episode.  He was getting some better then returned yesterday.  No FCNAV.  Had some constipation.  Had a BM yesterday but no BMs for the 3 days prior to that.  No blood in stool.  On anticoagulation.  H/o diverticulitis years ago.    PMH and SH reviewed  ROS: See HPI, otherwise noncontributory.  Meds, vitals, and allergies reviewed.   nad Mmm rrr ctab abd soft.  Not ttp except LLQ ttp w/o rebound.  Normal BS Skin wnl Ext w/o edema

## 2015-06-19 NOTE — Patient Instructions (Signed)
Start cipro and flagyl.  Clear liquid diet in the meantime.  Update me if not gradually improving.  If suddenly worse, then go to the ER.  Take care.  Glad to see you.

## 2015-06-19 NOTE — Assessment & Plan Note (Signed)
Likely diverticulitis, okay for outpatient f/u.  Start cipro/flagyl.   Check basic labs today.  Clear liquid diet.  Okay for outpatient f/u.  Dx d/w pt.  See AVS.

## 2015-07-03 ENCOUNTER — Ambulatory Visit (INDEPENDENT_AMBULATORY_CARE_PROVIDER_SITE_OTHER): Payer: Medicare Other

## 2015-07-03 DIAGNOSIS — Z23 Encounter for immunization: Secondary | ICD-10-CM

## 2015-07-10 ENCOUNTER — Other Ambulatory Visit: Payer: Self-pay | Admitting: *Deleted

## 2015-07-10 DIAGNOSIS — I480 Paroxysmal atrial fibrillation: Secondary | ICD-10-CM

## 2015-07-10 MED ORDER — APIXABAN 5 MG PO TABS: 5.0000 mg | ORAL_TABLET | Freq: Two times a day (BID) | ORAL | Status: DC

## 2015-07-13 ENCOUNTER — Other Ambulatory Visit: Payer: Self-pay

## 2015-07-13 DIAGNOSIS — I480 Paroxysmal atrial fibrillation: Secondary | ICD-10-CM

## 2015-07-13 MED ORDER — APIXABAN 5 MG PO TABS: 5.0000 mg | ORAL_TABLET | Freq: Two times a day (BID) | ORAL | Status: DC

## 2015-08-02 ENCOUNTER — Encounter: Payer: Self-pay | Admitting: Cardiovascular Disease

## 2015-08-02 ENCOUNTER — Ambulatory Visit (INDEPENDENT_AMBULATORY_CARE_PROVIDER_SITE_OTHER): Payer: Medicare Other | Admitting: Cardiovascular Disease

## 2015-08-02 VITALS — BP 108/78 | HR 76 | Ht 72.0 in | Wt 187.2 lb

## 2015-08-02 DIAGNOSIS — I482 Chronic atrial fibrillation, unspecified: Secondary | ICD-10-CM

## 2015-08-02 NOTE — Assessment & Plan Note (Signed)
Ventricular rate is well controlled on current dose of metoprolol with no significant bradycardia. He is tolerating anticoagulation with Eliquis with no bleeding complications.

## 2015-08-02 NOTE — Progress Notes (Signed)
HPI  Timothy Bullock is a pleasant 81 year old male who is here today for a followup visit regarding chronic atrial fibrillation and bradycardia. He has no history of hypertension, diabetes or hyperlipidemia.  Echocardiogram in 2013 showed normal LV systolic function with only mild mitral and tricuspid regurgitation.  cardiac catheterization in 05/2012 showed no evidence of obstructive coronary artery disease with an ejection fraction of 55%. He developed hematuria while he was on  Xarelto . He had urology workup which was negative. He has been tolerating anticoagulation with  Eliquis. He reports no bleeding complications. He denies chest pain, shortness of breath or palpitations. He reports having diverticulitis 6 weeks ago which was treated successfully with antibiotics.    No Known Allergies   Current Outpatient Prescriptions on File Prior to Visit  Medication Sig Dispense Refill  . apixaban (ELIQUIS) 5 MG TABS tablet Take 1 tablet (5 mg total) by mouth 2 (two) times daily. 60 tablet 6  . metoprolol tartrate (LOPRESSOR) 25 MG tablet Take 1 tablet (25 mg total) by mouth 2 (two) times daily. 180 tablet 3  . omeprazole (PRILOSEC) 20 MG capsule Take 1 capsule (20 mg total) by mouth daily. Needs appointment for additional refills. 90 capsule 0   No current facility-administered medications on file prior to visit.     Past Medical History  Diagnosis Date  . 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 Ulcer     gastric  . Blood in stool   . Urinary incontinence   . Prostate cancer [facility])     s/p resection  . Paroxysmal atrial fibrillation ([place]) 05/28/2012  . Diverticulitis      Past Surgical History  Procedure Laterality Date  . Prostatectomy  1993  . Colonoscopy  02/2012    negative pathology   . Cardiac catheterization  2013    [facility]     Family History  Problem Relation Age of Onset  . Cancer Mother     H/O colon CA  . Alcohol abuse Father   .  Cancer Father     CA of tongue  . Cancer Other     colon, parents <60     Social History   Social History  . Marital Status: Married    Spouse Name: N/A  . Number of Children: N/A  . Years of Education: N/A   Occupational History  . Retired    Social History Main Topics  . Smoking status: Former Smoker    Types: Cigarettes    Quit date: 09/15/1965  . Smokeless tobacco: Not on file  . Alcohol Use: Yes     Comment: Rare  . Drug Use: No  . Sexual Activity: Not on file   Other Topics Concern  . Not on file   Social History Narrative   Married 1982.   High school grad   Regular exercise:  Yes, walking, golf   From [place]     PHYSICAL EXAM   BP 108/78 mmHg  Pulse 76  Ht 6' (1.829 m)  Wt 187 lb 4 oz (84.936 kg)  BMI 25.39 kg/m2  Constitutional: He is oriented to person, place, and time. He appears well-developed and well-nourished. No distress.  HENT: No nasal discharge.  Head: Normocephalic and atraumatic.  Eyes: Pupils are equal and round. Right eye exhibits no discharge. Left eye exhibits no discharge.  Neck: Normal range of motion. Neck supple. No JVD present. No thyromegaly present.  Cardiovascular: Normal rate, irregular  rhythm with premature beats, normal heart sounds and. Exam reveals no gallop and no friction rub. No murmur heard.  Pulmonary/Chest: Effort normal and breath sounds normal. No stridor. No respiratory distress. He has no wheezes. He has no rales. He exhibits no tenderness.  Abdominal: Soft. Bowel sounds are normal. He exhibits no distension. There is no tenderness. There is no rebound and no guarding.  Musculoskeletal: Normal range of motion. He exhibits no edema and no tenderness.  Neurological: He is alert and oriented to person, place, and time. Coordination normal.  Skin: Skin is warm and dry. No rash noted. He is not diaphoretic. No erythema. No pallor.  Psychiatric: He has a normal mood and affect. His behavior is normal. Judgment  and thought content normal.     EKG: Atrial fibrillation  - occasional ectopic ventricular beat    -Right bundle branch block.   ABNORMAL     ASSESSMENT AND PLAN

## 2015-08-02 NOTE — Patient Instructions (Signed)
Medication Instructions: Continue same medications.   Labwork: None.   Procedures/Testing: None.   Follow-Up: 6 months with Dr. Arida.   Any Additional Special Instructions Will Be Listed Below (If Applicable).   

## 2015-08-04 ENCOUNTER — Other Ambulatory Visit: Payer: Self-pay | Admitting: Cardiovascular Disease

## 2015-08-14 DIAGNOSIS — D485 Neoplasm of uncertain behavior of skin: Secondary | ICD-10-CM | POA: Diagnosis not present

## 2015-08-14 DIAGNOSIS — Z85828 Personal history of other malignant neoplasm of skin: Secondary | ICD-10-CM | POA: Diagnosis not present

## 2015-08-14 DIAGNOSIS — L570 Actinic keratosis: Secondary | ICD-10-CM | POA: Diagnosis not present

## 2015-08-14 DIAGNOSIS — C44519 Basal cell carcinoma of skin of other part of trunk: Secondary | ICD-10-CM | POA: Diagnosis not present

## 2015-08-14 DIAGNOSIS — D2272 Melanocytic nevi of left lower limb, including hip: Secondary | ICD-10-CM | POA: Diagnosis not present

## 2015-08-14 DIAGNOSIS — L821 Other seborrheic keratosis: Secondary | ICD-10-CM | POA: Diagnosis not present

## 2015-08-14 DIAGNOSIS — D225 Melanocytic nevi of trunk: Secondary | ICD-10-CM | POA: Diagnosis not present

## 2015-09-16 HISTORY — PX: PERCUTANEOUS NEPHROSTOMY: SHX2208

## 2016-01-03 ENCOUNTER — Ambulatory Visit (INDEPENDENT_AMBULATORY_CARE_PROVIDER_SITE_OTHER): Payer: Medicare Other | Admitting: Family Medicine

## 2016-01-03 ENCOUNTER — Encounter: Payer: Self-pay | Admitting: Family Medicine

## 2016-01-03 VITALS — BP 112/76 | HR 83 | Temp 98.0°F | Wt 177.8 lb

## 2016-01-03 DIAGNOSIS — I482 Chronic atrial fibrillation, unspecified: Secondary | ICD-10-CM

## 2016-01-03 DIAGNOSIS — Z125 Encounter for screening for malignant neoplasm of prostate: Secondary | ICD-10-CM | POA: Diagnosis not present

## 2016-01-03 DIAGNOSIS — R634 Abnormal weight loss: Secondary | ICD-10-CM | POA: Diagnosis not present

## 2016-01-03 LAB — COMPREHENSIVE METABOLIC PANEL
ALT: 11 U/L (ref 0–53)
AST: 13 U/L (ref 0–37)
Albumin: 3.9 g/dL (ref 3.5–5.2)
Alkaline Phosphatase: 63 U/L (ref 39–117)
BUN: 20 mg/dL (ref 6–23)
CO2: 28 meq/L (ref 19–32)
Calcium: 9.1 mg/dL (ref 8.4–10.5)
Chloride: 105 mEq/L (ref 96–112)
Creatinine, Ser: 1.37 mg/dL (ref 0.40–1.50)
GFR: 52.91 mL/min — AB (ref >60.00)
GLUCOSE: 103 mg/dL — AB (ref 70–99)
POTASSIUM: 4.6 meq/L (ref 3.5–5.1)
Sodium: 138 mEq/L (ref 135–145)
Total Bilirubin: 0.4 mg/dL (ref 0.2–1.2)
Total Protein: 6.7 g/dL (ref 6.0–8.3)

## 2016-01-03 LAB — CBC WITH DIFFERENTIAL/PLATELET
BASOS PCT: 0.5 % (ref 0.0–3.0)
Basophils Absolute: 0 10*3/uL (ref 0.0–0.1)
EOS ABS: 0.3 10*3/uL (ref 0.0–0.7)
Eosinophils Relative: 4.4 % (ref 0.0–5.0)
HEMATOCRIT: 38.8 % — AB (ref 39.0–52.0)
HEMOGLOBIN: 13 g/dL (ref 13.0–17.0)
LYMPHS PCT: 22.2 % (ref 12.0–46.0)
Lymphs Abs: 1.7 10*3/uL (ref 0.7–4.0)
MCHC: 33.5 g/dL (ref 30.0–36.0)
MCV: 91.6 fl (ref 78.0–100.0)
Monocytes Absolute: 0.7 10*3/uL (ref 0.1–1.0)
Monocytes Relative: 9.3 % (ref 3.0–12.0)
Neutro Abs: 4.9 10*3/uL (ref 1.4–7.7)
Neutrophils Relative %: 63.6 % (ref 43.0–77.0)
Platelets: 330 10*3/uL (ref 150.0–400.0)
RBC: 4.23 Mil/uL (ref 4.22–5.81)
RDW: 13.2 % (ref 11.5–15.5)
WBC: 7.7 10*3/uL (ref 4.0–10.5)

## 2016-01-03 LAB — PSA, MEDICARE: PSA: 0 ng/ml — ABNORMAL LOW (ref 0.10–4.00)

## 2016-01-03 LAB — TSH: TSH: 0.77 u[IU]/mL (ref 0.35–4.50)

## 2016-01-03 NOTE — Patient Instructions (Addendum)
You can take an extra 1/2 dose of metoprolol if needed, if your heart is racing.   Take care.  Glad to see you.  Go to the lab on the way out.  We'll contact you with your lab report. If you continue to have weight loss then let me know.

## 2016-01-03 NOTE — Progress Notes (Signed)
Pre visit review using our clinic review tool, if applicable. No additional management support is needed unless otherwise documented below in the visit note.  Losing weight, noted per old records for comparison.  He had some bowel changes recently.  Constipation recently noted.  Has had to use dulcolax recently, with some positive results. HYPERPLASTIC POLYP on colonoscopy 2013.  No blood in stool.  No FCNAV.  He doesn't feel sick, but he was concerned about the weight loss, ie wanted eval.  Still playing golf 3 times a week and his scoring isn't worse- he shot a 74 recently.  He does note slight dec in appetite recently.    He has PAF, and occ has some elevated heart rates, up to ~100, usually for about 1-2 hours.  Still on baseline meds.  Has seen cards prev.    PMH and SH reviewed  ROS: See HPI, otherwise noncontributory.  Meds, vitals, and allergies reviewed.   GEN: nad, alert and oriented HEENT: mucous membranes moist NECK: supple w/o LA CV: IRR, not tachy, no murmur PULM: ctab, no inc wob ABD: soft, +bs EXT: no edema SKIN: no acute rash

## 2016-01-03 NOTE — Assessment & Plan Note (Signed)
New issue, needs w/u.  D/w pt.  Broad ddx.  This could range from benign weight loss from dec in appetite vs true pathology.   The constipation seemed to be self limited and possibly an unrelated event.   Prev with colonoscopy done, records reviewed with patient at [place], with pathology noted, benign.  Benign abd exam today.  Check labs today.  He'll monitor weight and update me as needed.  Okay for outpatient f/u.  He agrees.

## 2016-01-03 NOTE — Assessment & Plan Note (Signed)
He can take an extra dose of BB if needed, see AVS.  He agrees.

## 2016-02-05 ENCOUNTER — Encounter: Payer: Self-pay | Admitting: Cardiovascular Disease

## 2016-02-05 ENCOUNTER — Ambulatory Visit (INDEPENDENT_AMBULATORY_CARE_PROVIDER_SITE_OTHER): Payer: Medicare Other | Admitting: Cardiovascular Disease

## 2016-02-05 VITALS — BP 106/64 | HR 78 | Ht 72.0 in | Wt 176.1 lb

## 2016-02-05 DIAGNOSIS — I482 Chronic atrial fibrillation, unspecified: Secondary | ICD-10-CM

## 2016-02-05 NOTE — Progress Notes (Signed)
Cardiology Office Note   Date:  02/05/2016   ID:  Timothy Bullock, DOB [birth year], MRN [id number]  PCP:  Elsie Stain, MD  Cardiologist:   Kathlyn Sacramento, MD   Chief Complaint  Patient presents with  . Follow-up    no cp, sob or swelling      History of Present Illness: Timothy Bullock is a 82 y.o. male who presents for a followup visit regarding chronic atrial fibrillation and bradycardia. He has no history of hypertension, diabetes or hyperlipidemia.  Echocardiogram in 2013 showed normal LV systolic function with only mild mitral and tricuspid regurgitation.  cardiac catheterization in 05/2012 showed no evidence of obstructive coronary artery disease with an ejection fraction of 55%. He developed hematuria while he was on  Xarelto . He had urology workup which was negative. He has been tolerating anticoagulation with Eliquis.   He reports one episode of tachycardia while he was playing golf. It lasted for about 10 minutes and resolved without intervention. Other than that, he has been doing very well with no exertional symptoms. No chest pain or shortness of breath.  Past Medical History  Diagnosis Date  . 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 Ulcer     gastric  . Blood in stool   . Urinary incontinence   . Prostate cancer [facility])     s/p resection  . Paroxysmal atrial fibrillation ([place]) 05/28/2012  . Diverticulitis     Past Surgical History  Procedure Laterality Date  . Prostatectomy  1993  . Colonoscopy  02/2012    negative pathology   . Cardiac catheterization  2013    [facility]     Current Outpatient Prescriptions  Medication Sig Dispense Refill  . apixaban (ELIQUIS) 5 MG TABS tablet Take 1 tablet (5 mg total) by mouth 2 (two) times daily. 60 tablet 6  . metoprolol tartrate (LOPRESSOR) 25 MG tablet TAKE 1 TABLET TWICE A DAY 180 tablet 2   No current facility-administered medications for this visit.    Allergies:   Review of  patient's allergies indicates no known allergies.    Social History:  The patient  reports that he quit smoking about 50 years ago. His smoking use included Cigarettes. He does not have any smokeless tobacco history on file. He reports that he drinks alcohol. He reports that he does not use illicit drugs.   Family History:  The patient's family history includes Alcohol abuse in his father; Cancer in his father, mother, and other.    ROS:  Please see the history of present illness.   Otherwise, review of systems are positive for none.   All other systems are reviewed and negative.    PHYSICAL EXAM: VS:  BP 106/64 mmHg  Pulse 78  Ht 6' (1.829 m)  Wt 176 lb 1.9 oz (79.888 kg)  BMI 23.88 kg/m2 , BMI Body mass index is 23.88 kg/(m^2). GEN: Well nourished, well developed, in no acute distress HEENT: normal Neck: no JVD, carotid bruits, or masses Cardiac: Irregularly irregular; no murmurs, rubs, or gallops,no edema  Respiratory:  clear to auscultation bilaterally, normal work of breathing GI: soft, nontender, nondistended, + BS MS: no deformity or atrophy Skin: warm and dry, no rash Neuro:  Strength and sensation are intact Psych: euthymic mood, full affect   EKG:  EKG is not ordered today.    Recent Labs: 01/03/2016: ALT 11; BUN 20; Creatinine, Ser 1.37; Hemoglobin 13.0; Platelets 330.0; Potassium 4.6;  Sodium 138; TSH 0.77    Lipid Panel No results found for: CHOL, TRIG, HDL, CHOLHDL, VLDL, LDLCALC, LDLDIRECT    Wt Readings from Last 3 Encounters:  02/05/16 176 lb 1.9 oz (79.888 kg)  01/03/16 177 lb 12 oz (80.627 kg)  08/02/15 187 lb 4 oz (84.936 kg)        ASSESSMENT AND PLAN:  1.  Chronic atrial fibrillation: Ventricular rate is reasonably controlled on current dose of metoprolol. I recommended that he uses a Dose as needed if he develops episodes of tachycardia. He is tolerating anticoagulation with no side effects. I reviewed his labs from April and they were overall  unremarkable.    Disposition:   FU with me in 1 year  Signed,  Kathlyn Sacramento, MD  02/05/2016 5:18 PM    [facility]

## 2016-02-05 NOTE — Patient Instructions (Signed)
Medication Instructions: Continue same medications.  Take an extra dose of Metoprolol as needed for palpitations and fast heart beats.   Labwork: None.   Procedures/Testing: None.   Follow-Up: 1 year with Dr. Fletcher Anon.   Any Additional Special Instructions Will Be Listed Below (If Applicable).     If you need a refill on your cardiac medications before your next appointment, please call your pharmacy.

## 2016-02-07 DIAGNOSIS — Z85828 Personal history of other malignant neoplasm of skin: Secondary | ICD-10-CM | POA: Diagnosis not present

## 2016-02-07 DIAGNOSIS — D225 Melanocytic nevi of trunk: Secondary | ICD-10-CM | POA: Diagnosis not present

## 2016-02-07 DIAGNOSIS — L570 Actinic keratosis: Secondary | ICD-10-CM | POA: Diagnosis not present

## 2016-02-07 DIAGNOSIS — L821 Other seborrheic keratosis: Secondary | ICD-10-CM | POA: Diagnosis not present

## 2016-02-07 DIAGNOSIS — D1801 Hemangioma of skin and subcutaneous tissue: Secondary | ICD-10-CM | POA: Diagnosis not present

## 2016-03-12 ENCOUNTER — Ambulatory Visit (INDEPENDENT_AMBULATORY_CARE_PROVIDER_SITE_OTHER): Payer: Medicare Other | Admitting: Family Medicine

## 2016-03-12 ENCOUNTER — Encounter: Payer: Self-pay | Admitting: Family Medicine

## 2016-03-12 VITALS — BP 124/78 | HR 96 | Temp 98.0°F | Ht 72.0 in | Wt 178.5 lb

## 2016-03-12 DIAGNOSIS — Z87448 Personal history of other diseases of urinary system: Secondary | ICD-10-CM | POA: Diagnosis not present

## 2016-03-12 DIAGNOSIS — R319 Hematuria, unspecified: Secondary | ICD-10-CM

## 2016-03-12 LAB — POC URINALSYSI DIPSTICK (AUTOMATED)
Bilirubin, UA: NEGATIVE
GLUCOSE UA: NEGATIVE
KETONES UA: NEGATIVE
Leukocytes, UA: NEGATIVE
Nitrite, UA: NEGATIVE
PH UA: 5
RBC UA: POSITIVE
Spec Grav, UA: 1.025
Urobilinogen, UA: 0.2

## 2016-03-12 NOTE — Progress Notes (Signed)
Pre visit review using our clinic review tool, if applicable. No additional management support is needed unless otherwise documented below in the visit note.  Weight loss has levelled off.  Prev labs unremarkable.  He agrees.    He saw red urine, bloody urine 8 days ago.  No pain.  No more gross hematuria in the meantime.  Still with blood in urine today on UA but not visible to patient.   No other urinary sx other than incontinence at baseline with prev urinary surgery.   No FCNAVD.  No flank pain.  Some occ right lower back pain starting about 3 weeks ago, but that was thought to be MSK and better with tylenol.   On Eliquis at baseline.  No other bleeding recently.    PSA recently zero.  Former smoker, but not in 52 years.   Prev hematuria w/u ~2 years ago per Dr. 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  D/w pt.     Meds, vitals, and allergies reviewed.   ROS: Per HPI unless specifically indicated in ROS section   GEN: nad, alert and oriented HEENT: mucous membranes moist NECK: supple w/o LA CV: IRR PULM: ctab, no inc wob ABD: soft, +bs EXT: no edema SKIN: no acute rash R lower back slightly ttp but no cva pain and no midline pain.

## 2016-03-12 NOTE — Patient Instructions (Signed)
We'll contact you with your lab report. If culture is positive, we'll treat. If negative, we'll likely check with Dr. Jacqlyn Larsen.  He may want to see you back.  Take care.  Glad to see you.

## 2016-03-13 NOTE — Assessment & Plan Note (Signed)
Prev hematuria w/u ~2 years ago per Dr. 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  D/w pt.    Still with pos u/a.  Check ucx.  If culture is positive, we'll treat. If negative, we'll likely check with Dr. Jacqlyn Larsen.  D/w pt.  Nontoxic.  He agrees.

## 2016-03-14 LAB — URINE CULTURE
Colony Count: NO GROWTH
Organism ID, Bacteria: NO GROWTH

## 2016-03-22 ENCOUNTER — Emergency Department
Admission: EM | Admit: 2016-03-22 | Discharge: 2016-03-22 | Disposition: A | Discharge status: Other Acute Inpatient Hospital | Payer: Medicare Other | Attending: Emergency Medicine | Admitting: Emergency Medicine

## 2016-03-22 ENCOUNTER — Inpatient Hospital Stay (HOSPITAL_COMMUNITY)
Admission: AD | Admit: 2016-03-22 | Payer: Medicare Other | Source: Other Acute Inpatient Hospital | Admitting: Internal Medicine

## 2016-03-22 ENCOUNTER — Emergency Department: Payer: Medicare Other

## 2016-03-22 DIAGNOSIS — N3040 Irradiation cystitis without hematuria: Secondary | ICD-10-CM | POA: Diagnosis not present

## 2016-03-22 DIAGNOSIS — Z87891 Personal history of nicotine dependence: Secondary | ICD-10-CM | POA: Insufficient documentation

## 2016-03-22 DIAGNOSIS — N19 Unspecified kidney failure: Secondary | ICD-10-CM | POA: Insufficient documentation

## 2016-03-22 DIAGNOSIS — Z7982 Long term (current) use of aspirin: Secondary | ICD-10-CM | POA: Diagnosis not present

## 2016-03-22 DIAGNOSIS — Z8546 Personal history of malignant neoplasm of prostate: Secondary | ICD-10-CM | POA: Insufficient documentation

## 2016-03-22 DIAGNOSIS — I480 Paroxysmal atrial fibrillation: Secondary | ICD-10-CM | POA: Diagnosis not present

## 2016-03-22 DIAGNOSIS — R109 Unspecified abdominal pain: Secondary | ICD-10-CM | POA: Diagnosis not present

## 2016-03-22 DIAGNOSIS — I482 Chronic atrial fibrillation: Secondary | ICD-10-CM | POA: Insufficient documentation

## 2016-03-22 DIAGNOSIS — R319 Hematuria, unspecified: Secondary | ICD-10-CM | POA: Diagnosis not present

## 2016-03-22 DIAGNOSIS — Z79899 Other long term (current) drug therapy: Secondary | ICD-10-CM | POA: Insufficient documentation

## 2016-03-22 DIAGNOSIS — Z6823 Body mass index (BMI) 23.0-23.9, adult: Secondary | ICD-10-CM | POA: Diagnosis not present

## 2016-03-22 DIAGNOSIS — N179 Acute kidney failure, unspecified: Secondary | ICD-10-CM | POA: Diagnosis not present

## 2016-03-22 DIAGNOSIS — M545 Low back pain: Secondary | ICD-10-CM | POA: Diagnosis not present

## 2016-03-22 DIAGNOSIS — N329 Bladder disorder, unspecified: Secondary | ICD-10-CM | POA: Diagnosis not present

## 2016-03-22 DIAGNOSIS — N1330 Unspecified hydronephrosis: Secondary | ICD-10-CM | POA: Insufficient documentation

## 2016-03-22 DIAGNOSIS — N132 Hydronephrosis with renal and ureteral calculous obstruction: Secondary | ICD-10-CM | POA: Diagnosis not present

## 2016-03-22 DIAGNOSIS — E870 Hyperosmolality and hypernatremia: Secondary | ICD-10-CM | POA: Diagnosis not present

## 2016-03-22 DIAGNOSIS — R7989 Other specified abnormal findings of blood chemistry: Secondary | ICD-10-CM | POA: Diagnosis not present

## 2016-03-22 DIAGNOSIS — R52 Pain, unspecified: Secondary | ICD-10-CM

## 2016-03-22 DIAGNOSIS — N131 Hydronephrosis with ureteral stricture, not elsewhere classified: Secondary | ICD-10-CM | POA: Diagnosis not present

## 2016-03-22 DIAGNOSIS — Z7901 Long term (current) use of anticoagulants: Secondary | ICD-10-CM | POA: Diagnosis not present

## 2016-03-22 DIAGNOSIS — I4891 Unspecified atrial fibrillation: Secondary | ICD-10-CM | POA: Diagnosis not present

## 2016-03-22 DIAGNOSIS — N3289 Other specified disorders of bladder: Secondary | ICD-10-CM

## 2016-03-22 DIAGNOSIS — R1031 Right lower quadrant pain: Secondary | ICD-10-CM | POA: Diagnosis not present

## 2016-03-22 LAB — CBC WITH DIFFERENTIAL/PLATELET
BASOS PCT: 1 %
Basophils Absolute: 0.1 10*3/uL (ref 0–0.1)
EOS ABS: 0.2 10*3/uL (ref 0–0.7)
Eosinophils Relative: 2 %
HEMATOCRIT: 34.1 % — AB (ref 40.0–52.0)
HEMOGLOBIN: 11.7 g/dL — AB (ref 13.0–18.0)
Lymphocytes Relative: 14 %
Lymphs Abs: 1.4 10*3/uL (ref 1.0–3.6)
MCH: 31.1 pg (ref 26.0–34.0)
MCHC: 34.3 g/dL (ref 32.0–36.0)
MCV: 90.7 fL (ref 80.0–100.0)
Monocytes Absolute: 1 10*3/uL (ref 0.2–1.0)
Monocytes Relative: 10 %
NEUTROS PCT: 73 %
Neutro Abs: 7.4 10*3/uL — ABNORMAL HIGH (ref 1.4–6.5)
Platelets: 349 10*3/uL (ref 150–440)
RBC: 3.76 MIL/uL — AB (ref 4.40–5.90)
RDW: 13.6 % (ref 11.5–14.5)
WBC: 10 10*3/uL (ref 3.8–10.6)

## 2016-03-22 LAB — URINALYSIS COMPLETE WITH MICROSCOPIC (ARMC ONLY)
Bilirubin Urine: NEGATIVE
Glucose, UA: NEGATIVE mg/dL
Ketones, ur: NEGATIVE mg/dL
LEUKOCYTES UA: NEGATIVE
NITRITE: NEGATIVE
PH: 5 (ref 5.0–8.0)
PROTEIN: NEGATIVE mg/dL
Specific Gravity, Urine: 1.011 (ref 1.005–1.030)

## 2016-03-22 LAB — COMPREHENSIVE METABOLIC PANEL
ALBUMIN: 3.6 g/dL (ref 3.5–5.0)
ALK PHOS: 71 U/L (ref 38–126)
ALT: 23 U/L (ref 17–63)
ANION GAP: 7 (ref 5–15)
AST: 21 U/L (ref 15–41)
BUN: 50 mg/dL — ABNORMAL HIGH (ref 6–20)
CALCIUM: 9 mg/dL (ref 8.9–10.3)
CHLORIDE: 111 mmol/L (ref 101–111)
CO2: 22 mmol/L (ref 22–32)
CREATININE: 3.6 mg/dL — AB (ref 0.61–1.24)
GFR calc non Af Amer: 15 mL/min — ABNORMAL LOW (ref >60)
GFR, EST AFRICAN AMERICAN: 17 mL/min — AB (ref >60)
GLUCOSE: 125 mg/dL — AB (ref 65–99)
Potassium: 5.3 mmol/L — ABNORMAL HIGH (ref 3.5–5.1)
SODIUM: 140 mmol/L (ref 135–145)
Total Bilirubin: 0.5 mg/dL (ref 0.3–1.2)
Total Protein: 7.1 g/dL (ref 6.5–8.1)

## 2016-03-22 MED ORDER — ONDANSETRON HCL 4 MG/2ML IJ SOLN
4.0000 mg | Freq: Once | INTRAMUSCULAR | Status: DC
Start: 2016-03-22 — End: 2016-03-22
  Filled 2016-03-22: qty 2

## 2016-03-22 MED ORDER — MORPHINE SULFATE (PF) 4 MG/ML IV SOLN
4.0000 mg | Freq: Once | INTRAVENOUS | Status: DC
  Filled 2016-03-22: qty 1

## 2016-03-22 NOTE — Progress Notes (Signed)
Tx from [facility] for IR intervention. CT scan showed b/l hydronephrosis with large bladder mass ER at [facility] spoke with Dr. Noah Delaine who looked at the CT scan does not feel he can get a stent through the mass.   ER doc spoke with interventional radiology (does not remember name) and they do not want to try and put a nephrostomy tube as he is on eliquis. They suggested to keep him in the hospital for a day or 2 until the eliquis wears off and then to place percutaneous nephrostomy. Patient has new AKI and hyperkalemia. -will need IR consult-- has urology follow up with Dr. Ardeth Sportsman DO

## 2016-03-22 NOTE — ED Notes (Signed)
Report given to transport team, Anastasio Champion RN

## 2016-03-22 NOTE — ED Notes (Signed)
Pt c/o right flank and lower back pain for the past 7-10 days.. Denies N/V/D.Timothy Bullock

## 2016-03-22 NOTE — ED Provider Notes (Signed)
[facility] Emergency Department Provider Note   ____________________________________________  Time seen: Approximately 10:14 AM  I have reviewed the triage vital signs and the nursing notes.   HISTORY  Chief Complaint Flank Pain    HPI Timothy Bullock is a 82 y.o. male who reports over 1 week of pain in the right side of his low back. He is not sure what brought it on. It's worse at night. It's worse when he moves or turns. He also had some blood in his urine recently. He can go back to see Dr. Maree Krabbe urologist but can't get into next week. He says the pain is keeping him up at night he can't sleep and he really can't take it anymore. His regular doctor did a urine culture on him that was negative. Review of his records from [facility] shows history of prostate cancer and a renal mass as well.   Past Medical History  Diagnosis Date  . 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 Ulcer     gastric  . Blood in stool   . Urinary incontinence   . Prostate cancer [facility])     s/p resection  . Paroxysmal atrial fibrillation ([place]) 05/28/2012  . Diverticulitis     Patient Active Problem List   Diagnosis Date Noted  . Loss of weight 01/03/2016  . LLQ pain 06/19/2015  . Chronic atrial fibrillation ([place]) 02/06/2015  . History of hematuria 03/01/2014  . Lymphadenopathy 03/01/2014  . Bradycardia 11/03/2013  . Shingles 06/17/2013  . 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) 07/22/2012  . Paroxysmal atrial fibrillation ([place]) 05/28/2012  . Dyspnea 05/20/2012  . Palpitations 04/08/2012  . Elevated BP 07/02/2011  . GASTRIC ULCER 07/23/2010  . Blood in stool 07/23/2010  . URINARY INCONTINENCE 07/23/2010    Past Surgical History  Procedure Laterality Date  . Prostatectomy  1993  . Colonoscopy  02/2012    negative pathology   . Cardiac catheterization  2013    [facility]    Current Outpatient Rx  Name  Route  Sig  Dispense  Refill  .  apixaban (ELIQUIS) 5 MG TABS tablet   Oral   Take 1 tablet (5 mg total) by mouth 2 (two) times daily.   60 tablet   6   . metoprolol tartrate (LOPRESSOR) 25 MG tablet      TAKE 1 TABLET TWICE A DAY   180 tablet   2     Allergies Review of patient's allergies indicates no known allergies.  Family History  Problem Relation Age of Onset  . Cancer Mother     H/O colon CA  . Alcohol abuse Father   . Cancer Father     CA of tongue  . Cancer Other     colon, parents <60    Social History Social History  Substance Use Topics  . Smoking status: Former Smoker    Types: Cigarettes    Quit date: 09/15/1965  . Smokeless tobacco: None  . Alcohol Use: 0.0 oz/week    0 Standard drinks or equivalent per week     Comment: Rare    Review of Systems Constitutional: No fever/chills Eyes: No visual changes. ENT: No sore throat. Cardiovascular: Denies chest pain. Respiratory: Denies shortness of breath. Gastrointestinal: No abdominal pain.  No nausea, no vomiting.  No diarrhea.  No constipation. Genitourinary: Negative for dysuria. Musculoskeletal: See history of present illness Skin: Negative for rash. Neurological: Negative for headaches, focal weakness  or numbness.  10-point ROS otherwise negative.  ____________________________________________   PHYSICAL EXAM:  VITAL SIGNS: ED Triage Vitals  Enc Vitals Group     BP 03/22/16 1000 125/64 mmHg     Pulse Rate 03/22/16 1000 56     Resp 03/22/16 1000 18     Temp 03/22/16 1000 97.7 F (36.5 C)     Temp Source 03/22/16 1000 Oral     SpO2 03/22/16 1000 98 %     Weight 03/22/16 1000 175 lb (79.379 kg)     Height 03/22/16 1000 6' (1.829 m)     Head Cir --      Peak Flow --      Pain Score 03/22/16 1001 7     Pain Loc --      Pain Edu? --      Excl. in [place]? --     Constitutional: Alert and oriented. Well appearing and in no acute distress. Eyes: Conjunctivae are normal. PERRL. EOMI. Head: Atraumatic. Nose: No  congestion/rhinnorhea. Mouth/Throat: Mucous membranes are moist.  Oropharynx non-erythematous. Neck: No stridor. Cardiovascular: Normal rate, regular rhythm. Grossly normal heart sounds.  Good peripheral circulation. Respiratory: Normal respiratory effort.  No retractions. Lungs CTAB. Gastrointestinal: Soft and nontender. No distention. No abdominal bruits. No CVA tenderness. Musculoskeletal: No lower extremity tenderness nor edema.  No joint effusions.There is no spinal tenderness. There is no CVA tenderness is noted above. There is tenderness on the right side low back just above the pelvic brim in the muscle area. Neurologic:  Normal speech and language. No gross focal neurologic deficits are appreciated. No gait instability. Skin:  Skin is warm, dry and intact. No rash noted. Psychiatric: Mood and affect are normal. Speech and behavior are normal.  ____________________________________________   LABS (all labs ordered are listed, but only abnormal results are displayed)  Labs Reviewed  COMPREHENSIVE METABOLIC PANEL - Abnormal; Notable for the following:    Potassium 5.3 (*)    Glucose, Bld 125 (*)    BUN 50 (*)    Creatinine, Ser 3.60 (*)    GFR calc non Af Amer 15 (*)    GFR calc Af Amer 17 (*)    All other components within normal limits  CBC WITH DIFFERENTIAL/PLATELET - Abnormal; Notable for the following:    RBC 3.76 (*)    Hemoglobin 11.7 (*)    HCT 34.1 (*)    Neutro Abs 7.4 (*)    All other components within normal limits  URINALYSIS COMPLETEWITH MICROSCOPIC ([facility] ONLY) - Abnormal; Notable for the following:    Color, Urine STRAW (*)    APPearance CLEAR (*)    Hgb urine dipstick 2+ (*)    Bacteria, UA RARE (*)    Squamous Epithelial / LPF 0-5 (*)    All other components within normal limits   ____________________________________________  EKG   ____________________________________________  RADIOLOGY  CLINICAL DATA: RIGHT-side in lower back pain for 7-10  days, gross hematuria, history prostate cancer, atrial fibrillation  EXAM: CT ABDOMEN AND PELVIS WITHOUT CONTRAST  TECHNIQUE: Multidetector CT imaging of the abdomen and pelvis was performed following the standard protocol without IV contrast. Sagittal and coronal MPR images reconstructed from axial data set. Oral contrast not administered for this indication.  COMPARISON: None  FINDINGS: Lower chest: Minimal scarring at RIGHT lung base.  Hepatobiliary: Cholelithiasis without gallbladder wall thickening or biliary dilatation. Liver unremarkable.  Pancreas: Normal appearance  Spleen: Normal appearance  Adrenals/Urinary Tract: Adrenal glands normal appearance. BILATERAL hydronephrosis and  hydroureter extending to bladder. Patient reportedly prostatectomy. Normal appearing bladder is not identified. Apposition of bladder, a masslike density is identified which could represent a decompressed urinary bladder with significant wall thickening, recurrent prostate tumor, or a contracted bladder containing blood clot. Pain question measures approximately 5.0 x 3.1 x 2.8 cm  Stomach/Bowel: Normal appendix. Scattered colonic diverticulosis greatest at sigmoid colon. Tiny hiatal hernia. Stomach and bowel loops otherwise unremarkable.  Vascular/Lymphatic: Atherosclerotic calcification aorta to a greatest size of 2.6 x 2.6 cm. Atherosclerotic calcifications in iliac arteries and minimally in coronary arteries as well.  Reproductive: N/A  Other: No free air or free fluid. Small LEFT inguinal hernia containing fat.  Musculoskeletal: Bones demineralized.  IMPRESSION: BILATERAL hydronephrosis hydroureter terminating at soft tissue opacity at the expected position of the urinary bladder, questioned contracted bladder with marked wall thickening, large bladder mass, recurrent prostate cancer inpatient reportedly post prostatectomy, less likely bladder filled with  clot.  Further assessment with cystoscopy recommended.  No urinary tract calcifications.  Cholelithiasis.  Colonic diverticulosis.  Small LEFT inguinal hernia containing fat.  Aortic atherosclerosis.   Electronically Signed  By: Lavonia Dana M.D.  On: 03/22/2016 10:44 ____________________________________________   PROCEDURES    Procedures    ____________________________________________   INITIAL IMPRESSION / ASSESSMENT AND PLAN / ED COURSE  Pertinent labs & imaging results that were available during my care of the patient were reviewed by me and considered in my medical decision making (see chart for details).  Discussed the patient with Dr. Noah Delaine urology at [facility] who is on-call for this hospital. He looks at the CT scan does not feel he can get a stent through that mass. He looked at the CT scan with at least one of the other senior neurologist at [facility] who agrees. We called interventional radiology interventional radiology does not want to try and put a nephrostomy tube in him as he is on L course at present. His suggestion is to keep him here in the hospital for a day or 2 until he WEARS off and then just to the percutaneous nephrostomy here. I discussed the patient with Dr. cope again Dr. cope with his regular urologist Dr. cope does not think her affect is sure that he cannot do this at [facility] where he has probable privileges. Urology at [facility] feels that should be managed locally. I'm waiting to get the hospitalist at [facility] because our hospitalist does not feel comfortable admitting the patient here if he deteriorates rapidly over the weekend we would not have any urologist who couldn't get here easily. Also there would be nothing that they could do based on what they said so far. Hospitalist feels and I agree that it would be better managed at a larger hospital. We are waiting currently for the hospitalist at [facility] call me back. [facility]  hospitalist Dr. Larwance Sachs and calls back normal except the patient ______________________________________   FINAL CLINICAL IMPRESSION(S) / ED DIAGNOSES  Final diagnoses:  Pain  Bilateral hydronephrosis  Renal failure      NEW MEDICATIONS STARTED DURING THIS VISIT:  New Prescriptions   No medications on file     Note:  This document was prepared using Dragon voice recognition software and may include unintentional dictation errors.    Nena Polio, MD 03/22/16 [phone]

## 2016-03-23 DIAGNOSIS — N393 Stress incontinence (female) (male): Secondary | ICD-10-CM | POA: Diagnosis present

## 2016-03-23 DIAGNOSIS — N131 Hydronephrosis with ureteral stricture, not elsewhere classified: Secondary | ICD-10-CM | POA: Diagnosis present

## 2016-03-23 DIAGNOSIS — N3289 Other specified disorders of bladder: Secondary | ICD-10-CM | POA: Diagnosis not present

## 2016-03-23 DIAGNOSIS — N3040 Irradiation cystitis without hematuria: Secondary | ICD-10-CM | POA: Diagnosis present

## 2016-03-23 DIAGNOSIS — N1330 Unspecified hydronephrosis: Secondary | ICD-10-CM | POA: Diagnosis not present

## 2016-03-23 DIAGNOSIS — Z8711 Personal history of peptic ulcer disease: Secondary | ICD-10-CM | POA: Diagnosis not present

## 2016-03-23 DIAGNOSIS — R338 Other retention of urine: Secondary | ICD-10-CM | POA: Diagnosis present

## 2016-03-23 DIAGNOSIS — Z87440 Personal history of urinary (tract) infections: Secondary | ICD-10-CM | POA: Diagnosis not present

## 2016-03-23 DIAGNOSIS — N329 Bladder disorder, unspecified: Secondary | ICD-10-CM | POA: Diagnosis present

## 2016-03-23 DIAGNOSIS — E875 Hyperkalemia: Secondary | ICD-10-CM | POA: Diagnosis not present

## 2016-03-23 DIAGNOSIS — N132 Hydronephrosis with renal and ureteral calculous obstruction: Secondary | ICD-10-CM | POA: Diagnosis not present

## 2016-03-23 DIAGNOSIS — I4510 Unspecified right bundle-branch block: Secondary | ICD-10-CM | POA: Diagnosis not present

## 2016-03-23 DIAGNOSIS — Z7982 Long term (current) use of aspirin: Secondary | ICD-10-CM | POA: Diagnosis not present

## 2016-03-23 DIAGNOSIS — Z7901 Long term (current) use of anticoagulants: Secondary | ICD-10-CM | POA: Diagnosis not present

## 2016-03-23 DIAGNOSIS — N179 Acute kidney failure, unspecified: Secondary | ICD-10-CM | POA: Diagnosis not present

## 2016-03-23 DIAGNOSIS — I10 Essential (primary) hypertension: Secondary | ICD-10-CM | POA: Diagnosis present

## 2016-03-23 DIAGNOSIS — Z8546 Personal history of malignant neoplasm of prostate: Secondary | ICD-10-CM | POA: Diagnosis not present

## 2016-03-23 DIAGNOSIS — E870 Hyperosmolality and hypernatremia: Secondary | ICD-10-CM | POA: Diagnosis not present

## 2016-03-23 DIAGNOSIS — Z8551 Personal history of malignant neoplasm of bladder: Secondary | ICD-10-CM | POA: Diagnosis not present

## 2016-03-23 DIAGNOSIS — Z87891 Personal history of nicotine dependence: Secondary | ICD-10-CM | POA: Diagnosis not present

## 2016-03-23 DIAGNOSIS — I4891 Unspecified atrial fibrillation: Secondary | ICD-10-CM | POA: Diagnosis not present

## 2016-03-23 DIAGNOSIS — R7989 Other specified abnormal findings of blood chemistry: Secondary | ICD-10-CM | POA: Diagnosis not present

## 2016-03-26 ENCOUNTER — Telehealth: Payer: Self-pay | Admitting: Cardiovascular Disease

## 2016-03-26 NOTE — Telephone Encounter (Signed)
(  see below)  Dr Clydene Laming calling back to check on status on clearance. Just wanted to let us know this needs to be done asap  Procedure is this Friday Please advise.

## 2016-03-26 NOTE — Telephone Encounter (Signed)
Request for surgical clearance:  1. What type of surgery is being performed? Transurethral of bladder infection    2. When is this surgery scheduled? 03/28/16  3. Are there any medications that need to be held prior to surgery and how long? Anti coag pt is currently on  Asprin 81, also takes Eliquis but has been of Eliquis since Saturday.   4. Name of physician performing surgery? Dr Elyse Hsu   5. What is your office phone and fax number? Fax: [phone]

## 2016-03-27 NOTE — Telephone Encounter (Signed)
I spoke with the surgeon yesterday.  This patient is low risk from a cardiac standpoint. No need to be on Aspirin at all.  Keep Eliquis on hold and resume 1 days after surgery if no bleeding complications.

## 2016-03-27 NOTE — Telephone Encounter (Signed)
Routed this note below to Dr Madelin Rear office and to Dr Case Clydene Laming.

## 2016-03-28 ENCOUNTER — Encounter: Payer: Self-pay | Admitting: Family Medicine

## 2016-03-28 ENCOUNTER — Telehealth: Payer: Self-pay | Admitting: Family Medicine

## 2016-03-28 DIAGNOSIS — C679 Malignant neoplasm of bladder, unspecified: Secondary | ICD-10-CM | POA: Diagnosis present

## 2016-03-28 DIAGNOSIS — N3289 Other specified disorders of bladder: Secondary | ICD-10-CM | POA: Diagnosis not present

## 2016-03-28 DIAGNOSIS — D494 Neoplasm of unspecified behavior of bladder: Secondary | ICD-10-CM | POA: Diagnosis not present

## 2016-03-28 DIAGNOSIS — C689 Malignant neoplasm of urinary organ, unspecified: Secondary | ICD-10-CM | POA: Diagnosis not present

## 2016-03-28 DIAGNOSIS — Z7901 Long term (current) use of anticoagulants: Secondary | ICD-10-CM | POA: Diagnosis not present

## 2016-03-28 DIAGNOSIS — I10 Essential (primary) hypertension: Secondary | ICD-10-CM | POA: Diagnosis present

## 2016-03-28 DIAGNOSIS — N329 Bladder disorder, unspecified: Secondary | ICD-10-CM | POA: Diagnosis not present

## 2016-03-28 DIAGNOSIS — Z79899 Other long term (current) drug therapy: Secondary | ICD-10-CM | POA: Diagnosis not present

## 2016-03-28 DIAGNOSIS — Z8546 Personal history of malignant neoplasm of prostate: Secondary | ICD-10-CM | POA: Diagnosis not present

## 2016-03-28 DIAGNOSIS — Z923 Personal history of irradiation: Secondary | ICD-10-CM | POA: Diagnosis not present

## 2016-03-28 DIAGNOSIS — I4891 Unspecified atrial fibrillation: Secondary | ICD-10-CM | POA: Diagnosis present

## 2016-03-28 NOTE — Telephone Encounter (Signed)
Called to check on patient, called home and cell phone.  LMOVM.  No confidential info left.   App help from all involved.

## 2016-03-31 NOTE — Telephone Encounter (Signed)
Called and LMOVM for patient. No confidential info left.

## 2016-04-01 ENCOUNTER — Emergency Department
Admission: EM | Admit: 2016-04-01 | Discharge: 2016-04-01 | Disposition: A | Discharge status: Home / Self Care | Payer: Medicare Other | Attending: Emergency Medicine | Admitting: Emergency Medicine

## 2016-04-01 DIAGNOSIS — Z8546 Personal history of malignant neoplasm of prostate: Secondary | ICD-10-CM | POA: Insufficient documentation

## 2016-04-01 DIAGNOSIS — I4892 Unspecified atrial flutter: Secondary | ICD-10-CM | POA: Diagnosis not present

## 2016-04-01 DIAGNOSIS — Z79899 Other long term (current) drug therapy: Secondary | ICD-10-CM | POA: Insufficient documentation

## 2016-04-01 DIAGNOSIS — T83098A Other mechanical complication of other indwelling urethral catheter, initial encounter: Secondary | ICD-10-CM

## 2016-04-01 DIAGNOSIS — Y69 Unspecified misadventure during surgical and medical care: Secondary | ICD-10-CM | POA: Insufficient documentation

## 2016-04-01 DIAGNOSIS — T85690A Other mechanical complication of epidural and subdural infusion catheter, initial encounter: Secondary | ICD-10-CM | POA: Diagnosis not present

## 2016-04-01 DIAGNOSIS — N99528 Other complication of other external stoma of urinary tract: Secondary | ICD-10-CM | POA: Diagnosis not present

## 2016-04-01 DIAGNOSIS — Z87891 Personal history of nicotine dependence: Secondary | ICD-10-CM | POA: Insufficient documentation

## 2016-04-01 NOTE — Discharge Instructions (Signed)
PLEASE GO TO YOUR UROLOGIST'S OFFICE TODAY TO HAVE TO TUBING OF YOUR NEPHROSTOMY REPLACED

## 2016-04-01 NOTE — ED Provider Notes (Signed)
[facility] Emergency Department Provider Note   ____________________________________________  Time seen: Approximately 656 AM  I have reviewed the triage vital signs and the nursing notes.   HISTORY  Chief Complaint Post-op Problem    HPI Timothy Bullock is a 82 y.o. male who comes into the hospital today with some displacement of his nephrostomy tube. The patient has bilateral nephrostomy tubes which are placed at [facility]. The patient reports that he was sleeping and woke up in a puddle. He noticed that the left tube had pulled loose from the three-way stopcock. He called EMS and they initially placed some tape on the area that he continued to leak. The patient decided to come into the hospital for evaluation and repair of the tubing. The patient reports that he did not want to drive all the way to [facility]. The patient has no flank pain and reports that the tubing has not pulled away from his tach. That has not been displaced in any way. The patient has no other complaints at this time.   Past Medical History  Diagnosis Date  . 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 Ulcer     gastric  . Blood in stool   . Urinary incontinence   . Prostate cancer [facility])     s/p resection  . Paroxysmal atrial fibrillation ([place]) 05/28/2012  . Diverticulitis   . ARF (acute renal failure) [facility])     Patient Active Problem List   Diagnosis Date Noted  . Loss of weight 01/03/2016  . LLQ pain 06/19/2015  . Chronic atrial fibrillation ([place]) 02/06/2015  . History of hematuria 03/01/2014  . Lymphadenopathy 03/01/2014  . Bradycardia 11/03/2013  . Shingles 06/17/2013  . 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) 07/22/2012  . Paroxysmal atrial fibrillation ([place]) 05/28/2012  . Dyspnea 05/20/2012  . Palpitations 04/08/2012  . Elevated BP 07/02/2011  . GASTRIC ULCER 07/23/2010  . Blood in stool 07/23/2010  . URINARY INCONTINENCE 07/23/2010    Past  Surgical History  Procedure Laterality Date  . Prostatectomy  1993  . Colonoscopy  02/2012    negative pathology   . Cardiac catheterization  2013    [facility]  . Percutaneous nephrostomy  2017    Current Outpatient Rx  Name  Route  Sig  Dispense  Refill  . apixaban (ELIQUIS) 5 MG TABS tablet   Oral   Take 1 tablet (5 mg total) by mouth 2 (two) times daily.   60 tablet   6   . metoprolol tartrate (LOPRESSOR) 25 MG tablet      TAKE 1 TABLET TWICE A DAY   180 tablet   2     Allergies Review of patient's allergies indicates no known allergies.  Family History  Problem Relation Age of Onset  . Cancer Mother     H/O colon CA  . Alcohol abuse Father   . Cancer Father     CA of tongue  . Cancer Other     colon, parents <60    Social History Social History  Substance Use Topics  . Smoking status: Former Smoker    Types: Cigarettes    Quit date: 09/15/1965  . Smokeless tobacco: Not on file  . Alcohol Use: 0.0 oz/week    0 Standard drinks or equivalent per week     Comment: Rare    Review of Systems Constitutional: No fever/chills Eyes: No visual changes. ENT: No sore throat. Cardiovascular: Denies chest  pain. Respiratory: Denies shortness of breath. Gastrointestinal: No abdominal pain.  No nausea, no vomiting.  No diarrhea.  No constipation. Genitourinary: Negative for dysuria. Musculoskeletal: Negative for back pain. Skin: Negative for rash. Neurological: Negative for headaches, focal weakness or numbness.  10-point ROS otherwise negative.  ____________________________________________   PHYSICAL EXAM:  VITAL SIGNS: ED Triage Vitals  Enc Vitals Group     BP 04/01/16 0554 127/73 mmHg     Pulse Rate 04/01/16 0554 88     Resp 04/01/16 0554 17     Temp 04/01/16 0554 97.8 F (36.6 C)     Temp Source 04/01/16 0554 Oral     SpO2 04/01/16 0554 99 %     Weight --      Height --      Head Cir --      Peak Flow --      Pain Score --      Pain Loc --       Pain Edu? --      Excl. in [place]? --     Constitutional: Alert and oriented. Well appearing and in no acute distress. Eyes: Conjunctivae are normal. PERRL. EOMI. Head: Atraumatic. Nose: No congestion/rhinnorhea. Mouth/Throat: Mucous membranes are moist.  Oropharynx non-erythematous. Cardiovascular: Normal rate, regular rhythm. Grossly normal heart sounds.  Good peripheral circulation. Respiratory: Normal respiratory effort.  No retractions. Lungs CTAB. Gastrointestinal: Soft and nontender. No distention. Positive bowel sounds Musculoskeletal: No lower extremity tenderness nor edema.  Neurologic:  Normal speech and language.  Skin:  Skin is warm, dry and intact.  Psychiatric: Mood and affect are normal.   ____________________________________________   LABS (all labs ordered are listed, but only abnormal results are displayed)  Labs Reviewed - No data to display ____________________________________________  EKG  none ____________________________________________  RADIOLOGY  none ____________________________________________   PROCEDURES  Procedure(s) performed: None  Procedures  Critical Care performed: No  ____________________________________________   INITIAL IMPRESSION / ASSESSMENT AND PLAN / ED COURSE  Pertinent labs & imaging results that were available during my care of the patient were reviewed by me and considered in my medical decision making (see chart for details).  This is an 82 year old male who comes into the hospital today with a problem with his nephrostomy tube tubing. It his displaced from the three-way stopcock. We were able to put it back in but it was still loose. We attempted to contact the operating room to obtain replacement tubing but they report that we do not have it here in the emergency department. We did reattach the tubing to the stopcock and then secured it with some Hall Busing. I encouraged the patient to follow-up with his urologist to have  them repair this in a more effective way. Otherwise the patient has no further complaints or concerns. He'll be discharged home. ____________________________________________   FINAL CLINICAL IMPRESSION(S) / ED DIAGNOSES  Final diagnoses:  Malfunction of nephrostomy tube (HCC)      NEW MEDICATIONS STARTED DURING THIS VISIT:  New Prescriptions   No medications on file     Note:  This document was prepared using Dragon voice recognition software and may include unintentional dictation errors.    Loney Hering, MD 04/01/16 [phone]

## 2016-04-01 NOTE — ED Notes (Signed)
Pt comes in with bilateral nephrostomy tubes in place. The tubing to the left one has pulled loose in one section. It currently is taped to prevent it from leaking. The patient reports that he woke up and the sheet of his bed were wet. The nephrostomy tube itself is in place. Pt has no complaints of pain. There is no blood in the drainage bag.

## 2016-04-01 NOTE — ED Notes (Signed)
Discharge instructions reviewed with patient. Patient verbalized understanding. Patient taken to lobby via wheelchair without difficulty

## 2016-04-01 NOTE — ED Notes (Signed)
Pt was seen at [facility] for kidney blockage, subsequent biopsy; stent placement bilat; left side found pulled loose from drainage bag tonight; st EMS was called to house and attempted to secure with tape but has continued to pull loose; pt denies any c/o at present

## 2016-04-03 ENCOUNTER — Telehealth: Payer: Self-pay | Admitting: Family Medicine

## 2016-04-03 NOTE — Telephone Encounter (Signed)
Will you see if you can get up with patient? He was admitted to [facility], had nephrostomy tubes placed, then to ER with a loose tube.   I've called and LMOVM a few times but couldn't get anyone on the phone.   Thanks.

## 2016-04-03 NOTE — Telephone Encounter (Signed)
I spoke with Timothy Bullock.  He sounds in good spirits and says he is not in pain, just weak.  Patient says he was scoped and they found blockages.  The MD did a biopsy last Friday and the patient doesn't know the results yet because the MD is on vacation this week.  Patient is to followup with MD on Monday (Dr. Sherral Hammers).  Patient had a catheter for a while but that is out now, however he is not urinating because of the drainage tubes.  Patient says he supposes he will find out more on Monday as to what was causing the blockage and what the future plan will be.  Patient says he received Dr. Josefine Class messages and tried to phone back but it was after hours.  I told him that Dr. Keturah Barre was  wanting to get an update on his condition and that I would relay this message directly to Dr. Damita Dunnings.  I also told the patient to let us know if we can do anything to help, he appreciates it.

## 2016-04-04 NOTE — Telephone Encounter (Signed)
Noted. Thanks.  Will await input from pending testing/outside clinics.

## 2016-04-07 DIAGNOSIS — Z6822 Body mass index (BMI) 22.0-22.9, adult: Secondary | ICD-10-CM | POA: Diagnosis not present

## 2016-04-07 DIAGNOSIS — C679 Malignant neoplasm of bladder, unspecified: Secondary | ICD-10-CM | POA: Diagnosis not present

## 2016-04-08 ENCOUNTER — Telehealth: Payer: Self-pay | Admitting: *Deleted

## 2016-04-08 ENCOUNTER — Telehealth: Payer: Self-pay | Admitting: Cardiovascular Disease

## 2016-04-08 NOTE — Telephone Encounter (Signed)
Form from [facility] for Certification and Plan of Care.

## 2016-04-08 NOTE — Telephone Encounter (Signed)
I'll work on the hard copy.  Thanks.  

## 2016-04-08 NOTE — Telephone Encounter (Signed)
March 27, 2016  Wellington Hampshire, MD      7:52 AM  Note    I spoke with the surgeon yesterday.  This patient is low risk from a cardiac standpoint. No need to be on Aspirin at all.  Keep Eliquis on hold and resume 1 days after surgery if no bleeding complications.         Routed to Urology Scheduler

## 2016-04-08 NOTE — Telephone Encounter (Signed)
[facility] calling regarding cardiac clearance that was faxed yesterday. Please call.

## 2016-04-10 ENCOUNTER — Telehealth: Payer: Self-pay

## 2016-04-10 NOTE — Telephone Encounter (Signed)
S/w Dr. Hardin Negus, urologist at [facility], who states pt having more invasive surgery August 1 since July 14 TURP. Would like to know if pt needs to hold Eliquis now or resume as instructed but hold before August 1 . He was originally instructed to resume 1 day after surgery.   Forward to MD to review and advise.

## 2016-04-10 NOTE — Telephone Encounter (Signed)
Continue to hold and resume August 2nd.

## 2016-04-10 NOTE — Telephone Encounter (Signed)
Reviewed MD recommendations w/Dr. Jonna Clark at [facility] who verbalized understanding.

## 2016-04-11 DIAGNOSIS — C679 Malignant neoplasm of bladder, unspecified: Secondary | ICD-10-CM | POA: Diagnosis not present

## 2016-04-11 DIAGNOSIS — N390 Urinary tract infection, site not specified: Secondary | ICD-10-CM | POA: Diagnosis not present

## 2016-04-11 DIAGNOSIS — Z01818 Encounter for other preprocedural examination: Secondary | ICD-10-CM | POA: Diagnosis not present

## 2016-04-11 DIAGNOSIS — J984 Other disorders of lung: Secondary | ICD-10-CM | POA: Diagnosis not present

## 2016-04-11 DIAGNOSIS — I482 Chronic atrial fibrillation: Secondary | ICD-10-CM | POA: Diagnosis not present

## 2016-04-11 DIAGNOSIS — R918 Other nonspecific abnormal finding of lung field: Secondary | ICD-10-CM | POA: Diagnosis not present

## 2016-04-11 DIAGNOSIS — D689 Coagulation defect, unspecified: Secondary | ICD-10-CM | POA: Diagnosis not present

## 2016-04-15 DIAGNOSIS — I4891 Unspecified atrial fibrillation: Secondary | ICD-10-CM | POA: Diagnosis not present

## 2016-04-15 DIAGNOSIS — Z8546 Personal history of malignant neoplasm of prostate: Secondary | ICD-10-CM | POA: Diagnosis not present

## 2016-04-15 DIAGNOSIS — I482 Chronic atrial fibrillation: Secondary | ICD-10-CM | POA: Diagnosis not present

## 2016-04-15 DIAGNOSIS — C20 Malignant neoplasm of rectum: Secondary | ICD-10-CM | POA: Diagnosis not present

## 2016-04-15 DIAGNOSIS — I951 Orthostatic hypotension: Secondary | ICD-10-CM | POA: Diagnosis not present

## 2016-04-15 DIAGNOSIS — Z933 Colostomy status: Secondary | ICD-10-CM | POA: Diagnosis not present

## 2016-04-15 DIAGNOSIS — Z9889 Other specified postprocedural states: Secondary | ICD-10-CM | POA: Diagnosis not present

## 2016-04-15 DIAGNOSIS — I348 Other nonrheumatic mitral valve disorders: Secondary | ICD-10-CM | POA: Diagnosis not present

## 2016-04-15 DIAGNOSIS — Z87891 Personal history of nicotine dependence: Secondary | ICD-10-CM | POA: Diagnosis not present

## 2016-04-15 DIAGNOSIS — C679 Malignant neoplasm of bladder, unspecified: Secondary | ICD-10-CM | POA: Diagnosis not present

## 2016-04-15 DIAGNOSIS — Z960 Presence of urogenital implants: Secondary | ICD-10-CM | POA: Diagnosis not present

## 2016-04-15 DIAGNOSIS — I517 Cardiomegaly: Secondary | ICD-10-CM | POA: Diagnosis not present

## 2016-04-15 DIAGNOSIS — R001 Bradycardia, unspecified: Secondary | ICD-10-CM | POA: Diagnosis not present

## 2016-04-15 DIAGNOSIS — I480 Paroxysmal atrial fibrillation: Secondary | ICD-10-CM | POA: Diagnosis not present

## 2016-04-15 DIAGNOSIS — M1990 Unspecified osteoarthritis, unspecified site: Secondary | ICD-10-CM | POA: Diagnosis not present

## 2016-04-15 DIAGNOSIS — I4892 Unspecified atrial flutter: Secondary | ICD-10-CM | POA: Diagnosis not present

## 2016-04-15 DIAGNOSIS — C785 Secondary malignant neoplasm of large intestine and rectum: Secondary | ICD-10-CM | POA: Diagnosis present

## 2016-04-15 DIAGNOSIS — R2689 Other abnormalities of gait and mobility: Secondary | ICD-10-CM | POA: Diagnosis not present

## 2016-04-15 DIAGNOSIS — E43 Unspecified severe protein-calorie malnutrition: Secondary | ICD-10-CM | POA: Diagnosis present

## 2016-04-15 DIAGNOSIS — Z800 Family history of malignant neoplasm of digestive organs: Secondary | ICD-10-CM | POA: Diagnosis not present

## 2016-04-15 DIAGNOSIS — K219 Gastro-esophageal reflux disease without esophagitis: Secondary | ICD-10-CM | POA: Diagnosis not present

## 2016-04-15 DIAGNOSIS — C61 Malignant neoplasm of prostate: Secondary | ICD-10-CM | POA: Diagnosis not present

## 2016-04-15 DIAGNOSIS — C678 Malignant neoplasm of overlapping sites of bladder: Secondary | ICD-10-CM | POA: Diagnosis present

## 2016-04-15 DIAGNOSIS — D509 Iron deficiency anemia, unspecified: Secondary | ICD-10-CM | POA: Diagnosis not present

## 2016-04-15 DIAGNOSIS — I472 Ventricular tachycardia: Secondary | ICD-10-CM | POA: Diagnosis not present

## 2016-04-15 DIAGNOSIS — K30 Functional dyspepsia: Secondary | ICD-10-CM | POA: Diagnosis present

## 2016-04-15 DIAGNOSIS — R279 Unspecified lack of coordination: Secondary | ICD-10-CM | POA: Diagnosis not present

## 2016-04-15 DIAGNOSIS — R5381 Other malaise: Secondary | ICD-10-CM | POA: Diagnosis not present

## 2016-04-15 DIAGNOSIS — I083 Combined rheumatic disorders of mitral, aortic and tricuspid valves: Secondary | ICD-10-CM | POA: Diagnosis not present

## 2016-04-15 DIAGNOSIS — R55 Syncope and collapse: Secondary | ICD-10-CM | POA: Diagnosis not present

## 2016-04-15 DIAGNOSIS — R05 Cough: Secondary | ICD-10-CM | POA: Diagnosis not present

## 2016-04-15 DIAGNOSIS — M6281 Muscle weakness (generalized): Secondary | ICD-10-CM | POA: Diagnosis not present

## 2016-04-15 DIAGNOSIS — Z936 Other artificial openings of urinary tract status: Secondary | ICD-10-CM | POA: Diagnosis not present

## 2016-04-15 DIAGNOSIS — Z6822 Body mass index (BMI) 22.0-22.9, adult: Secondary | ICD-10-CM | POA: Diagnosis not present

## 2016-04-15 DIAGNOSIS — Z7901 Long term (current) use of anticoagulants: Secondary | ICD-10-CM | POA: Diagnosis not present

## 2016-04-15 DIAGNOSIS — I700 Atherosclerosis of aorta: Secondary | ICD-10-CM | POA: Diagnosis present

## 2016-04-15 DIAGNOSIS — C689 Malignant neoplasm of urinary organ, unspecified: Secondary | ICD-10-CM | POA: Diagnosis not present

## 2016-04-15 DIAGNOSIS — I4510 Unspecified right bundle-branch block: Secondary | ICD-10-CM | POA: Diagnosis not present

## 2016-04-18 ENCOUNTER — Encounter: Payer: Self-pay | Admitting: Family Medicine

## 2016-04-18 DIAGNOSIS — C679 Malignant neoplasm of bladder, unspecified: Secondary | ICD-10-CM | POA: Insufficient documentation

## 2016-05-02 ENCOUNTER — Ambulatory Visit: Payer: Medicare Other | Admitting: Family Medicine

## 2016-05-02 ENCOUNTER — Other Ambulatory Visit: Payer: Self-pay | Admitting: Cardiovascular Disease

## 2016-05-05 ENCOUNTER — Encounter: Payer: Self-pay | Admitting: Family Medicine

## 2016-05-05 DIAGNOSIS — K6289 Other specified diseases of anus and rectum: Secondary | ICD-10-CM | POA: Diagnosis not present

## 2016-05-05 DIAGNOSIS — R5381 Other malaise: Secondary | ICD-10-CM | POA: Diagnosis not present

## 2016-05-05 DIAGNOSIS — C61 Malignant neoplasm of prostate: Secondary | ICD-10-CM | POA: Diagnosis not present

## 2016-05-05 DIAGNOSIS — I348 Other nonrheumatic mitral valve disorders: Secondary | ICD-10-CM | POA: Diagnosis not present

## 2016-05-05 DIAGNOSIS — I481 Persistent atrial fibrillation: Secondary | ICD-10-CM | POA: Diagnosis not present

## 2016-05-05 DIAGNOSIS — C679 Malignant neoplasm of bladder, unspecified: Secondary | ICD-10-CM | POA: Diagnosis not present

## 2016-05-05 DIAGNOSIS — I480 Paroxysmal atrial fibrillation: Secondary | ICD-10-CM | POA: Diagnosis not present

## 2016-05-05 DIAGNOSIS — I482 Chronic atrial fibrillation: Secondary | ICD-10-CM | POA: Diagnosis not present

## 2016-05-05 DIAGNOSIS — Z933 Colostomy status: Secondary | ICD-10-CM | POA: Diagnosis not present

## 2016-05-05 DIAGNOSIS — I951 Orthostatic hypotension: Secondary | ICD-10-CM | POA: Diagnosis not present

## 2016-05-05 DIAGNOSIS — Z936 Other artificial openings of urinary tract status: Secondary | ICD-10-CM | POA: Diagnosis not present

## 2016-05-05 DIAGNOSIS — M1990 Unspecified osteoarthritis, unspecified site: Secondary | ICD-10-CM | POA: Diagnosis not present

## 2016-05-05 DIAGNOSIS — I4891 Unspecified atrial fibrillation: Secondary | ICD-10-CM | POA: Diagnosis not present

## 2016-05-05 DIAGNOSIS — M6281 Muscle weakness (generalized): Secondary | ICD-10-CM | POA: Diagnosis not present

## 2016-05-05 DIAGNOSIS — R2689 Other abnormalities of gait and mobility: Secondary | ICD-10-CM | POA: Diagnosis not present

## 2016-05-05 DIAGNOSIS — C670 Malignant neoplasm of trigone of bladder: Secondary | ICD-10-CM | POA: Diagnosis not present

## 2016-05-05 DIAGNOSIS — R279 Unspecified lack of coordination: Secondary | ICD-10-CM | POA: Diagnosis not present

## 2016-05-05 DIAGNOSIS — K219 Gastro-esophageal reflux disease without esophagitis: Secondary | ICD-10-CM | POA: Diagnosis not present

## 2016-05-05 DIAGNOSIS — E440 Moderate protein-calorie malnutrition: Secondary | ICD-10-CM | POA: Diagnosis not present

## 2016-05-05 NOTE — Progress Notes (Signed)
Opened for record review.

## 2016-05-08 DIAGNOSIS — C670 Malignant neoplasm of trigone of bladder: Secondary | ICD-10-CM | POA: Diagnosis not present

## 2016-05-08 DIAGNOSIS — I951 Orthostatic hypotension: Secondary | ICD-10-CM | POA: Diagnosis not present

## 2016-05-08 DIAGNOSIS — I481 Persistent atrial fibrillation: Secondary | ICD-10-CM | POA: Diagnosis not present

## 2016-05-08 DIAGNOSIS — Z933 Colostomy status: Secondary | ICD-10-CM

## 2016-05-08 DIAGNOSIS — Z936 Other artificial openings of urinary tract status: Secondary | ICD-10-CM

## 2016-05-08 DIAGNOSIS — E440 Moderate protein-calorie malnutrition: Secondary | ICD-10-CM | POA: Diagnosis not present

## 2016-05-09 ENCOUNTER — Telehealth: Payer: Self-pay

## 2016-05-09 DIAGNOSIS — K6289 Other specified diseases of anus and rectum: Secondary | ICD-10-CM | POA: Diagnosis not present

## 2016-05-09 NOTE — Telephone Encounter (Signed)
Patient's wife, Timothy Bullock, arrived to the office to discuss concerns over her husband's visit with NP Webb Silversmith this morning.  Webb Silversmith, NP saw Timothy Bullock at [facility] while making rounds for Dr. Silvio Pate.  Wife was not present for the encounter; however, her husband has been upset all day today after interpreting the NP to communicate to him that he will need to return to [facility] for more surgery.  This Probation officer discussed concerns with Webb Silversmith, NP who explained that Timothy Bullock misunderstood her.  She actually explained to him that his condition appeared to be progressing satisfactorily but that she would communicate with Dr. Silvio Pate and call the nurses at [facility] to provide follow up information.  NP discussed with Dr. Silvio Pate patient's condition and it was determined that everything was progressing as it should and that his rectum is open, not sewn "completely shut" as the patient was insisting and his slight bowel leakage would continue for the time being and is normal at this point.  This message was conveyed to the nursing staff at [facility] to communicate to patient.    After listening to wife's concerns, I clearly reviewed the for-mentioned explanation and apologized for any miscommunication that may have occurred.  I verbalized understanding of her need to "vent" and report her concerns and re-assured her that it was not our intent to cause her or her husband any distress and to please share our conversation today with her husband. I suggested that she discuss with [facility] any future visit times for her husband so that she may be aware and present for the visits to help prevent any further misunderstandings.      In closing, I asked her if we could do anything further to make her feel more comfortable and she replied, " thank you, but I just needed some TLC and you provided that, I do not need anything further, thank you."  She was calm, smiling and pleasant when she left.  She did  request that I inform Dr. Damita Dunnings (as he is his long time PCP) of the event.

## 2016-05-10 NOTE — Telephone Encounter (Signed)
I will try to talk to him on Monday about this

## 2016-05-11 NOTE — Telephone Encounter (Signed)
Noted, thanks for help from all on this.

## 2016-05-20 DIAGNOSIS — C679 Malignant neoplasm of bladder, unspecified: Secondary | ICD-10-CM | POA: Diagnosis not present

## 2016-05-22 DIAGNOSIS — I482 Chronic atrial fibrillation: Secondary | ICD-10-CM | POA: Diagnosis not present

## 2016-05-22 DIAGNOSIS — E440 Moderate protein-calorie malnutrition: Secondary | ICD-10-CM | POA: Diagnosis not present

## 2016-05-22 DIAGNOSIS — K219 Gastro-esophageal reflux disease without esophagitis: Secondary | ICD-10-CM

## 2016-05-22 DIAGNOSIS — C679 Malignant neoplasm of bladder, unspecified: Secondary | ICD-10-CM | POA: Diagnosis not present

## 2016-05-22 DIAGNOSIS — I951 Orthostatic hypotension: Secondary | ICD-10-CM | POA: Diagnosis not present

## 2016-05-25 DIAGNOSIS — Z483 Aftercare following surgery for neoplasm: Secondary | ICD-10-CM | POA: Diagnosis not present

## 2016-05-25 DIAGNOSIS — Z936 Other artificial openings of urinary tract status: Secondary | ICD-10-CM | POA: Diagnosis not present

## 2016-05-25 DIAGNOSIS — Z87891 Personal history of nicotine dependence: Secondary | ICD-10-CM | POA: Diagnosis not present

## 2016-05-25 DIAGNOSIS — E440 Moderate protein-calorie malnutrition: Secondary | ICD-10-CM | POA: Diagnosis not present

## 2016-05-25 DIAGNOSIS — I480 Paroxysmal atrial fibrillation: Secondary | ICD-10-CM | POA: Diagnosis not present

## 2016-05-25 DIAGNOSIS — C679 Malignant neoplasm of bladder, unspecified: Secondary | ICD-10-CM | POA: Diagnosis not present

## 2016-05-25 DIAGNOSIS — C61 Malignant neoplasm of prostate: Secondary | ICD-10-CM | POA: Diagnosis not present

## 2016-05-25 DIAGNOSIS — D509 Iron deficiency anemia, unspecified: Secondary | ICD-10-CM | POA: Diagnosis not present

## 2016-05-25 DIAGNOSIS — I951 Orthostatic hypotension: Secondary | ICD-10-CM | POA: Diagnosis not present

## 2016-05-25 DIAGNOSIS — M1990 Unspecified osteoarthritis, unspecified site: Secondary | ICD-10-CM | POA: Diagnosis not present

## 2016-05-25 DIAGNOSIS — Z7901 Long term (current) use of anticoagulants: Secondary | ICD-10-CM | POA: Diagnosis not present

## 2016-05-25 DIAGNOSIS — Z933 Colostomy status: Secondary | ICD-10-CM | POA: Diagnosis not present

## 2016-05-25 DIAGNOSIS — K219 Gastro-esophageal reflux disease without esophagitis: Secondary | ICD-10-CM | POA: Diagnosis not present

## 2016-05-30 DIAGNOSIS — C679 Malignant neoplasm of bladder, unspecified: Secondary | ICD-10-CM | POA: Diagnosis present

## 2016-05-30 DIAGNOSIS — N3289 Other specified disorders of bladder: Secondary | ICD-10-CM | POA: Diagnosis present

## 2016-05-30 DIAGNOSIS — Z800 Family history of malignant neoplasm of digestive organs: Secondary | ICD-10-CM | POA: Diagnosis not present

## 2016-05-30 DIAGNOSIS — C785 Secondary malignant neoplasm of large intestine and rectum: Secondary | ICD-10-CM | POA: Diagnosis present

## 2016-05-30 DIAGNOSIS — I482 Chronic atrial fibrillation: Secondary | ICD-10-CM | POA: Diagnosis present

## 2016-05-30 DIAGNOSIS — B029 Zoster without complications: Secondary | ICD-10-CM | POA: Diagnosis present

## 2016-05-30 DIAGNOSIS — T8189XA Other complications of procedures, not elsewhere classified, initial encounter: Secondary | ICD-10-CM | POA: Diagnosis present

## 2016-05-30 DIAGNOSIS — Z8546 Personal history of malignant neoplasm of prostate: Secondary | ICD-10-CM | POA: Diagnosis not present

## 2016-05-30 DIAGNOSIS — N2889 Other specified disorders of kidney and ureter: Secondary | ICD-10-CM | POA: Diagnosis present

## 2016-05-30 DIAGNOSIS — N393 Stress incontinence (female) (male): Secondary | ICD-10-CM | POA: Diagnosis present

## 2016-06-02 ENCOUNTER — Ambulatory Visit: Payer: Medicare Other | Admitting: Family Medicine

## 2016-06-02 DIAGNOSIS — C679 Malignant neoplasm of bladder, unspecified: Secondary | ICD-10-CM | POA: Diagnosis not present

## 2016-06-02 DIAGNOSIS — Z6820 Body mass index (BMI) 20.0-20.9, adult: Secondary | ICD-10-CM | POA: Diagnosis not present

## 2016-06-05 ENCOUNTER — Encounter: Payer: Self-pay | Admitting: Family Medicine

## 2016-06-05 ENCOUNTER — Ambulatory Visit (INDEPENDENT_AMBULATORY_CARE_PROVIDER_SITE_OTHER): Payer: Medicare Other | Admitting: Family Medicine

## 2016-06-05 VITALS — BP 114/60 | HR 69 | Temp 97.9°F | Wt 153.2 lb

## 2016-06-05 DIAGNOSIS — Z23 Encounter for immunization: Secondary | ICD-10-CM | POA: Diagnosis not present

## 2016-06-05 DIAGNOSIS — C679 Malignant neoplasm of bladder, unspecified: Secondary | ICD-10-CM | POA: Diagnosis not present

## 2016-06-05 DIAGNOSIS — R634 Abnormal weight loss: Secondary | ICD-10-CM | POA: Diagnosis not present

## 2016-06-05 NOTE — Progress Notes (Signed)
S/p inpatient tx for bladder cancer with colostomy and urostomy with removal of prev nephrostomy tubes.  S/p cardiac ablation.  Still with wound vac.  Here for f/u today.  No FCNAVD.  No blood in stool or urine seen by patient.   Still has urostomy and colostomy.  No skin breakdown.  He has f/u imaging pending at [facility] and that will affect his plans for f/u tx.    Prev with some rectal discharge.  Less now. D/w pt.  This is normal.    Eating better in the meantime.  He is glad to be home, sleeping better.  Strength is slowly getting better.  He is still doing PT exercises at home.    Cardiac w/u at [facility] noted.  He has f/u with cardiology pending.  S/p ablation.  Still on eliquis.    PMH and SH reviewed  ROS: Per HPI unless specifically indicated in ROS section   Meds, vitals, and allergies reviewed.   GEN: nad, alert and oriented HEENT: mucous membranes moist NECK: supple w/o LA CV: rrr.  PULM: ctab, no inc wob ABD: soft, +bs, colostomy and urostomy both in place, no skin breakdown. Both appear clean dry and intact. EXT: no edema SKIN: no acute rash

## 2016-06-05 NOTE — Assessment & Plan Note (Signed)
Status post extensive surgery, with colostomy and urostomy. He has follow-up imaging at [facility], and this will influence his future treatment plans. I will await consult notes. He is still doing PT at home. He is eating better. He has been relatively deconditioned from the procedures and hospital stay, but he is gradually getting his strength back. He will update me as needed. >25 minutes spent in face to face time with patient, >50% spent in counselling or coordination of care I appreciate the help of all involved in his care. I will check with [facility] about his previous med list for reconciliation.

## 2016-06-05 NOTE — Assessment & Plan Note (Signed)
This is improving as his diet is picking up. We will continue to follow.

## 2016-06-05 NOTE — Progress Notes (Signed)
Pre visit review using our clinic review tool, if applicable. No additional management support is needed unless otherwise documented below in the visit note. 

## 2016-06-05 NOTE — Patient Instructions (Signed)
I'll check with [place] about your meds in the meantime.  I'll await the notes from your other docs.  Take care.  Glad to see you.  Update me as needed.

## 2016-06-06 ENCOUNTER — Encounter: Payer: Self-pay | Admitting: Family Medicine

## 2016-06-06 DIAGNOSIS — Z6820 Body mass index (BMI) 20.0-20.9, adult: Secondary | ICD-10-CM | POA: Diagnosis not present

## 2016-06-06 DIAGNOSIS — I480 Paroxysmal atrial fibrillation: Secondary | ICD-10-CM | POA: Diagnosis not present

## 2016-06-12 ENCOUNTER — Telehealth: Payer: Self-pay | Admitting: Family Medicine

## 2016-06-12 NOTE — Telephone Encounter (Signed)
[facility] requesting verbal order   Verbal orders request to extend PT in home  cb number is [phone]

## 2016-06-12 NOTE — Telephone Encounter (Signed)
Stacey advised. 

## 2016-06-12 NOTE — Telephone Encounter (Signed)
Please thanks.

## 2016-06-13 DIAGNOSIS — I480 Paroxysmal atrial fibrillation: Secondary | ICD-10-CM | POA: Diagnosis not present

## 2016-06-13 DIAGNOSIS — I4891 Unspecified atrial fibrillation: Secondary | ICD-10-CM | POA: Diagnosis not present

## 2016-06-13 DIAGNOSIS — I483 Typical atrial flutter: Secondary | ICD-10-CM | POA: Diagnosis not present

## 2016-06-17 DIAGNOSIS — Z09 Encounter for follow-up examination after completed treatment for conditions other than malignant neoplasm: Secondary | ICD-10-CM | POA: Diagnosis not present

## 2016-06-19 ENCOUNTER — Other Ambulatory Visit: Payer: Self-pay | Admitting: Internal Medicine

## 2016-06-19 DIAGNOSIS — I480 Paroxysmal atrial fibrillation: Secondary | ICD-10-CM

## 2016-06-19 NOTE — Telephone Encounter (Signed)
Electronic refill request.  I sent part of these requests myself but wanted your ok on these.  I accidentally sent the Florinef #30/0RF and Eliquis #60/0RF.  Please let me know if I should cancel these Rx's

## 2016-06-20 MED ORDER — APIXABAN 5 MG PO TABS: 5.0000 mg | ORAL_TABLET | Freq: Two times a day (BID) | ORAL | 2 refills | Status: DC

## 2016-06-20 MED ORDER — FLUDROCORTISONE ACETATE 0.1 MG PO TABS: 100.0000 ug | ORAL_TABLET | Freq: Every day | ORAL | 2 refills | Status: DC

## 2016-06-20 NOTE — Telephone Encounter (Signed)
It's okay, I resent both, thanks.

## 2016-06-23 DIAGNOSIS — C678 Malignant neoplasm of overlapping sites of bladder: Secondary | ICD-10-CM | POA: Diagnosis not present

## 2016-06-23 DIAGNOSIS — Z09 Encounter for follow-up examination after completed treatment for conditions other than malignant neoplasm: Secondary | ICD-10-CM | POA: Diagnosis not present

## 2016-06-25 ENCOUNTER — Other Ambulatory Visit: Payer: Self-pay | Admitting: Internal Medicine

## 2016-06-30 DIAGNOSIS — Z9079 Acquired absence of other genital organ(s): Secondary | ICD-10-CM | POA: Diagnosis not present

## 2016-06-30 DIAGNOSIS — Z906 Acquired absence of other parts of urinary tract: Secondary | ICD-10-CM | POA: Diagnosis not present

## 2016-06-30 DIAGNOSIS — Z8546 Personal history of malignant neoplasm of prostate: Secondary | ICD-10-CM | POA: Diagnosis not present

## 2016-06-30 DIAGNOSIS — R918 Other nonspecific abnormal finding of lung field: Secondary | ICD-10-CM | POA: Diagnosis not present

## 2016-06-30 DIAGNOSIS — C779 Secondary and unspecified malignant neoplasm of lymph node, unspecified: Secondary | ICD-10-CM | POA: Diagnosis not present

## 2016-06-30 DIAGNOSIS — Z87891 Personal history of nicotine dependence: Secondary | ICD-10-CM | POA: Diagnosis not present

## 2016-06-30 DIAGNOSIS — Z6820 Body mass index (BMI) 20.0-20.9, adult: Secondary | ICD-10-CM | POA: Diagnosis not present

## 2016-06-30 DIAGNOSIS — C689 Malignant neoplasm of urinary organ, unspecified: Secondary | ICD-10-CM | POA: Diagnosis not present

## 2016-06-30 DIAGNOSIS — Z800 Family history of malignant neoplasm of digestive organs: Secondary | ICD-10-CM | POA: Diagnosis not present

## 2016-06-30 DIAGNOSIS — C679 Malignant neoplasm of bladder, unspecified: Secondary | ICD-10-CM | POA: Diagnosis not present

## 2016-07-01 DIAGNOSIS — Z09 Encounter for follow-up examination after completed treatment for conditions other than malignant neoplasm: Secondary | ICD-10-CM | POA: Diagnosis not present

## 2016-07-25 ENCOUNTER — Encounter: Payer: Self-pay | Admitting: Emergency Medicine

## 2016-07-25 ENCOUNTER — Emergency Department: Payer: Medicare Other

## 2016-07-25 ENCOUNTER — Emergency Department
Admission: EM | Admit: 2016-07-25 | Discharge: 2016-07-25 | Disposition: A | Discharge status: Home / Self Care | Payer: Medicare Other | Attending: Emergency Medicine | Admitting: Emergency Medicine

## 2016-07-25 DIAGNOSIS — Z8546 Personal history of malignant neoplasm of prostate: Secondary | ICD-10-CM | POA: Diagnosis not present

## 2016-07-25 DIAGNOSIS — R1030 Lower abdominal pain, unspecified: Secondary | ICD-10-CM | POA: Diagnosis not present

## 2016-07-25 DIAGNOSIS — Z8551 Personal history of malignant neoplasm of bladder: Secondary | ICD-10-CM | POA: Diagnosis not present

## 2016-07-25 DIAGNOSIS — R112 Nausea with vomiting, unspecified: Secondary | ICD-10-CM | POA: Diagnosis not present

## 2016-07-25 DIAGNOSIS — K9409 Other complications of colostomy: Secondary | ICD-10-CM | POA: Diagnosis not present

## 2016-07-25 DIAGNOSIS — Z933 Colostomy status: Secondary | ICD-10-CM | POA: Insufficient documentation

## 2016-07-25 DIAGNOSIS — Z79899 Other long term (current) drug therapy: Secondary | ICD-10-CM | POA: Diagnosis not present

## 2016-07-25 DIAGNOSIS — Z87891 Personal history of nicotine dependence: Secondary | ICD-10-CM | POA: Diagnosis not present

## 2016-07-25 DIAGNOSIS — T82519A Breakdown (mechanical) of unspecified cardiac and vascular devices and implants, initial encounter: Secondary | ICD-10-CM | POA: Diagnosis not present

## 2016-07-25 DIAGNOSIS — K8020 Calculus of gallbladder without cholecystitis without obstruction: Secondary | ICD-10-CM | POA: Diagnosis not present

## 2016-07-25 LAB — CBC
HCT: 37.9 % — ABNORMAL LOW (ref 40.0–52.0)
HEMOGLOBIN: 12.3 g/dL — AB (ref 13.0–18.0)
MCH: 31 pg (ref 26.0–34.0)
MCHC: 32.5 g/dL (ref 32.0–36.0)
MCV: 95.2 fL (ref 80.0–100.0)
Platelets: 335 10*3/uL (ref 150–440)
RBC: 3.98 MIL/uL — AB (ref 4.40–5.90)
RDW: 15.8 % — ABNORMAL HIGH (ref 11.5–14.5)
WBC: 13.5 10*3/uL — ABNORMAL HIGH (ref 3.8–10.6)

## 2016-07-25 LAB — URINALYSIS COMPLETE WITH MICROSCOPIC (ARMC ONLY)
Bilirubin Urine: NEGATIVE
Glucose, UA: NEGATIVE mg/dL
Ketones, ur: NEGATIVE mg/dL
Nitrite: NEGATIVE
PH: 7 (ref 5.0–8.0)
PROTEIN: 100 mg/dL — AB
Specific Gravity, Urine: 1.011 (ref 1.005–1.030)

## 2016-07-25 LAB — LIPASE, BLOOD: LIPASE: 20 U/L (ref 11–51)

## 2016-07-25 LAB — COMPREHENSIVE METABOLIC PANEL
ALBUMIN: 3.9 g/dL (ref 3.5–5.0)
ALK PHOS: 71 U/L (ref 38–126)
ALT: 10 U/L — AB (ref 17–63)
ANION GAP: 6 (ref 5–15)
AST: 17 U/L (ref 15–41)
BUN: 21 mg/dL — ABNORMAL HIGH (ref 6–20)
CALCIUM: 9.5 mg/dL (ref 8.9–10.3)
CHLORIDE: 107 mmol/L (ref 101–111)
CO2: 27 mmol/L (ref 22–32)
Creatinine, Ser: 1.23 mg/dL (ref 0.61–1.24)
GFR calc non Af Amer: 53 mL/min — ABNORMAL LOW (ref >60)
GLUCOSE: 139 mg/dL — AB (ref 65–99)
Potassium: 4.2 mmol/L (ref 3.5–5.1)
SODIUM: 140 mmol/L (ref 135–145)
Total Bilirubin: 0.7 mg/dL (ref 0.3–1.2)
Total Protein: 7.3 g/dL (ref 6.5–8.1)

## 2016-07-25 MED ORDER — ONDANSETRON HCL 4 MG/2ML IJ SOLN
4.0000 mg | Freq: Once | INTRAMUSCULAR | Status: AC
  Administered 2016-07-25: 4 mg via INTRAVENOUS

## 2016-07-25 MED ORDER — ONDANSETRON HCL 4 MG/2ML IJ SOLN
INTRAMUSCULAR | Status: AC
  Filled 2016-07-25: qty 2

## 2016-07-25 MED ORDER — ONDANSETRON HCL 4 MG PO TABS: 4.0000 mg | ORAL_TABLET | Freq: Three times a day (TID) | ORAL | 0 refills | Status: DC | PRN

## 2016-07-25 MED ORDER — ONDANSETRON HCL 4 MG/2ML IJ SOLN
4.0000 mg | Freq: Once | INTRAMUSCULAR | Status: AC | PRN
  Administered 2016-07-25: 4 mg via INTRAVENOUS

## 2016-07-25 MED ORDER — POLYETHYLENE GLYCOL 3350 17 G PO PACK: 17.0000 g | PACK | Freq: Every day | ORAL | 0 refills | Status: DC

## 2016-07-25 MED ORDER — IOPAMIDOL (ISOVUE-300) INJECTION 61%
100.0000 mL | Freq: Once | INTRAVENOUS | Status: AC | PRN
  Administered 2016-07-25: 100 mL via INTRAVENOUS

## 2016-07-25 MED ORDER — IOPAMIDOL (ISOVUE-300) INJECTION 61%
30.0000 mL | Freq: Once | INTRAVENOUS | Status: AC | PRN
  Administered 2016-07-25: 30 mL via ORAL

## 2016-07-25 NOTE — Discharge Instructions (Signed)
Please seek medical attention for any high fevers, chest pain, shortness of breath, change in behavior, persistent vomiting, bloody stool or any other new or concerning symptoms.  

## 2016-07-25 NOTE — ED Triage Notes (Signed)
Patient brought in by [facility] from home for abdominal pain and N/V that started last night. Patient recently had surgery in august and has a bladder bag and colostomy bag, patient states that he has not had much output from colostomy in the past few days. Patient actively vomiting during triage.

## 2016-07-25 NOTE — ED Provider Notes (Signed)
[facility] Emergency Department Provider Note   ____________________________________________   I have reviewed the triage vital signs and the nursing notes.   HISTORY  Chief Complaint Abdominal Pain; Nausea; and Emesis   History limited by: Not Limited   HPI Timothy Bullock is a 82 y.o. male who presents to the emergency department today because of concern for decreased ostomy output and lower abdominal pain. He noticed the decreased output starting two days ago. He feels like he is passing some gas. He has been having intermittent lower abdominal pain with the decreased output. It feels like spasms, will last a few minutes before going away. This morning the patient also developed nausea and vomiting. No fevers.   Past Medical History:  Diagnosis Date  . ARF (acute renal failure) ([place])   . Bladder cancer ([place])   . Blood in stool   . 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 Diverticulitis   . H/O cardiac radiofrequency ablation   . Paroxysmal atrial fibrillation ([place]) 05/28/2012  . Prostate cancer [facility])    s/p resection  . Ulcer ([place])    gastric  . Urinary incontinence     Patient Active Problem List   Diagnosis Date Noted  . Bladder cancer ([place]) 04/18/2016  . Loss of weight 01/03/2016  . LLQ pain 06/19/2015  . Chronic atrial fibrillation ([place]) 02/06/2015  . History of hematuria 03/01/2014  . Lymphadenopathy 03/01/2014  . Bradycardia 11/03/2013  . Shingles 06/17/2013  . 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) 07/22/2012  . Paroxysmal atrial fibrillation ([place]) 05/28/2012  . Dyspnea 05/20/2012  . Palpitations 04/08/2012  . Elevated BP 07/02/2011  . GASTRIC ULCER 07/23/2010  . Blood in stool 07/23/2010    Past Surgical History:  Procedure Laterality Date  . BLADDER REMOVAL    . CARDIAC CATHETERIZATION  2013   [facility]  . COLONOSCOPY  02/2012   negative pathology   . COLOSTOMY     with bladder cancer surgery  2017  . OTHER SURGICAL HISTORY     urostomy  . PERCUTANEOUS NEPHROSTOMY  2017  . PROSTATECTOMY  1993    Prior to Admission medications   Medication Sig Start Date End Date Taking? Authorizing Provider  amiodarone (PACERONE) 200 MG tablet TAKE 1 TABLET BY MOUTH DAILY 06/19/16   Tonia Ghent, MD  apixaban (ELIQUIS) 5 MG TABS tablet Take 1 tablet (5 mg total) by mouth 2 (two) times daily. 06/20/16   Tonia Ghent, MD  [facility] SENNA 8.6 MG tablet TAKE 2 TABLETS BY MOUTH AT BEDTIME 06/25/16   Venia Carbon, MD  ferrous sulfate 325 (65 FE) MG tablet TAKE 1 TABLET BY MOUTH DAILY WITH BREAKFAST 06/19/16   Tonia Ghent, MD  fludrocortisone (FLORINEF) 0.1 MG tablet Take 1 tablet (100 mcg total) by mouth daily. 06/20/16   Tonia Ghent, MD  omeprazole (PRILOSEC) 40 MG capsule Take 40 mg by mouth daily.    Historical Provider, MD  sodium chloride 1 g tablet Take 1 g by mouth 3 (three) times daily with meals.    Historical Provider, MD    Allergies Patient has no known allergies.  Family History  Problem Relation Age of Onset  . Cancer Mother     H/O colon CA  . Alcohol abuse Father   . Cancer Father     CA of tongue  . Cancer Other     colon, parents <60    Social History Social History  Substance Use Topics  . Smoking status: Former Smoker    Types: Cigarettes    Quit date: 09/15/1965  . Smokeless tobacco: Not on file  . Alcohol use 0.0 oz/week     Comment: Rare    Review of Systems  Constitutional: Negative for fever. Cardiovascular: Negative for chest pain. Respiratory: Negative for shortness of breath. Gastrointestinal: Positive for abdominal pain, nausea and vomiting. Genitourinary: Negative for dysuria. Musculoskeletal: Negative for back pain. Skin: Negative for rash. Neurological: Negative for headaches, focal weakness or numbness.  10-point ROS otherwise negative.  ____________________________________________   PHYSICAL EXAM:  VITAL SIGNS: ED Triage  Vitals  Enc Vitals Group     BP 07/25/16 0742 (!) 157/70     Pulse Rate 07/25/16 0742 60     Resp 07/25/16 0742 16     Temp 07/25/16 0742 97.5 F (36.4 C)     Temp Source 07/25/16 0742 Oral     SpO2 --      Weight 07/25/16 0742 150 lb (68 kg)     Height 07/25/16 0742 '6\' 3"'$  (1.905 m)     Head Circumference --      Peak Flow --      Pain Score 07/25/16 0743 7   Constitutional: Alert and oriented. Well appearing and in no distress. Eyes: Conjunctivae are normal. Normal extraocular movements. ENT   Head: Normocephalic and atraumatic.   Nose: No congestion/rhinnorhea.   Mouth/Throat: Mucous membranes are moist.   Neck: No stridor. Hematological/Lymphatic/Immunilogical: No cervical lymphadenopathy. Cardiovascular: Normal rate, regular rhythm.  No murmurs, rubs, or gallops.  Respiratory: Normal respiratory effort without tachypnea nor retractions. Breath sounds are clear and equal bilaterally. No wheezes/rales/rhonchi. Gastrointestinal: Soft and nontender. No distention. Ostomy with small amount of stool. Genitourinary: Deferred Musculoskeletal: Normal range of motion in all extremities. No lower extremity edema. Neurologic:  Normal speech and language. No gross focal neurologic deficits are appreciated.  Skin:  Skin is warm, dry and intact. No rash noted. Psychiatric: Mood and affect are normal. Speech and behavior are normal. Patient exhibits appropriate insight and judgment.  ____________________________________________    LABS (pertinent positives/negatives)  Labs Reviewed  COMPREHENSIVE METABOLIC PANEL - Abnormal; Notable for the following:       Result Value   Glucose, Bld 139 (*)    BUN 21 (*)    ALT 10 (*)    GFR calc non Af Amer 53 (*)    All other components within normal limits  CBC - Abnormal; Notable for the following:    WBC 13.5 (*)    RBC 3.98 (*)    Hemoglobin 12.3 (*)    HCT 37.9 (*)    RDW 15.8 (*)    All other components within normal limits   URINALYSIS COMPLETEWITH MICROSCOPIC ([facility] ONLY) - Abnormal; Notable for the following:    Color, Urine AMBER (*)    APPearance CLOUDY (*)    Hgb urine dipstick 2+ (*)    Protein, ur 100 (*)    Leukocytes, UA TRACE (*)    Bacteria, UA MANY (*)    Squamous Epithelial / LPF 0-5 (*)    All other components within normal limits  URINE CULTURE  LIPASE, BLOOD     ____________________________________________   EKG  None  ____________________________________________    RADIOLOGY  CT abd/pel IMPRESSION:  1. Interval abdominal surgery including abdominal perineal  resection, sigmoid colostomy and ileal conduit formation.  2. The colon is fluid-filled and mildly distended, but without  definite signs of obstruction.  3. Improved collecting system dilatation bilaterally. Chronic renal  cortical thinning on the left with decreased contrast excretion.  Nonsuspicious right renal lesions.  4. Cholelithiasis.  5. Interval L1 compression fracture.      ____________________________________________   PROCEDURES  Procedures  ____________________________________________   INITIAL IMPRESSION / ASSESSMENT AND PLAN / ED COURSE  Pertinent labs & imaging results that were available during my care of the patient were reviewed by me and considered in my medical decision making (see chart for details).  Patient with abdominal pain concern for decreased ostomy output. CT scan without definite signs of obstruction. While here patient did have a small amount of hard stool. Think part of the problem could be due to stool. Will plan on giving prescription for miralax. Additionally did add on urine culture given findings.    ____________________________________________   FINAL CLINICAL IMPRESSION(S) / ED DIAGNOSES  Final diagnoses:  Lower abdominal pain     Note: This dictation was prepared with Dragon dictation. Any transcriptional errors that result from this process are  unintentional    Nance Pear, MD 07/25/16 1140

## 2016-07-28 LAB — URINE CULTURE: Culture: >=100000 — AB

## 2016-07-29 NOTE — Progress Notes (Signed)
ED Culture Results CONSULT NOTE - INITIAL  No Known Allergies  Patient Measurements: Height: '6\' 3"'$  (190.5 cm) Weight: 150 lb (68 kg) IBW/kg (Calculated) : 84.5  Microbiology: Recent Results (from the past 720 hour(s))  Urine culture     Status: Abnormal   Collection Time: 07/25/16  9:56 AM  Result Value Ref Range Status   Specimen Description URINE, RANDOM  Final   Special Requests NONE  Final   Culture (A)  Final    >=100,000 COLONIES/mL ESCHERICHIA COLI >=100,000 COLONIES/mL PROVIDENCIA SPECIES    Report Status 07/28/2016 FINAL  Final   Organism ID, Bacteria ESCHERICHIA COLI (A)  Final   Organism ID, Bacteria PROVIDENCIA SPECIES (A)  Final      Susceptibility   Escherichia coli - MIC*    AMPICILLIN 4 SENSITIVE Sensitive     CEFAZOLIN <=4 SENSITIVE Sensitive     CEFTRIAXONE <=1 SENSITIVE Sensitive     CIPROFLOXACIN <=0.25 SENSITIVE Sensitive     GENTAMICIN <=1 SENSITIVE Sensitive     IMIPENEM <=0.25 SENSITIVE Sensitive     NITROFURANTOIN <=16 SENSITIVE Sensitive     TRIMETH/SULFA <=20 SENSITIVE Sensitive     AMPICILLIN/SULBACTAM 4 SENSITIVE Sensitive     PIP/TAZO <=4 SENSITIVE Sensitive     Extended ESBL NEGATIVE Sensitive     * >=100,000 COLONIES/mL ESCHERICHIA COLI   Providencia species - MIC*    AMPICILLIN >=32 RESISTANT Resistant     CEFAZOLIN >=64 RESISTANT Resistant     CEFTRIAXONE <=1 SENSITIVE Sensitive     CIPROFLOXACIN <=0.25 SENSITIVE Sensitive     GENTAMICIN <=1 RESISTANT Resistant     IMIPENEM 2 SENSITIVE Sensitive     NITROFURANTOIN 128 RESISTANT Resistant     TRIMETH/SULFA <=20 SENSITIVE Sensitive     AMPICILLIN/SULBACTAM >=32 RESISTANT Resistant     PIP/TAZO <=4 SENSITIVE Sensitive     * >=100,000 COLONIES/mL PROVIDENCIA SPECIES   Assessment: 82 yo male seen in ED on 11/10 got lower abdominal pain. Urine cultures showing >100,000 E.Coli and providencia. Patient was not discharged on any antibiotics.    Plan:  After discussion with Dr. Delman Kitten, patient will be prescribed ciprofloxacin '500mg'$  BID x 10 days.  Patient was contacted and UCx results and antibiotics were discussed. Patient verbalized understanding. [facility] [place] [facility] patient's request.   Pernell Dupre, PharmD Clinical Pharmacist  07/29/2016,1:32 PM

## 2016-07-30 ENCOUNTER — Telehealth: Payer: Self-pay | Admitting: Emergency Medicine

## 2016-07-30 NOTE — Telephone Encounter (Signed)
Pharmacy called and asking for approval of cipro called in and noted patient on amiodarone.   Per dr Jimmye Norman can change to septra ds 1 twice daily for 10 days.

## 2016-08-11 ENCOUNTER — Other Ambulatory Visit: Payer: Self-pay | Admitting: Family Medicine

## 2016-08-11 DIAGNOSIS — I4891 Unspecified atrial fibrillation: Secondary | ICD-10-CM

## 2016-08-11 DIAGNOSIS — C679 Malignant neoplasm of bladder, unspecified: Secondary | ICD-10-CM

## 2016-08-11 DIAGNOSIS — R7989 Other specified abnormal findings of blood chemistry: Secondary | ICD-10-CM

## 2016-08-11 DIAGNOSIS — Z13220 Encounter for screening for lipoid disorders: Secondary | ICD-10-CM

## 2016-08-11 DIAGNOSIS — R739 Hyperglycemia, unspecified: Secondary | ICD-10-CM

## 2016-08-12 ENCOUNTER — Ambulatory Visit (INDEPENDENT_AMBULATORY_CARE_PROVIDER_SITE_OTHER): Payer: Medicare Other

## 2016-08-12 VITALS — BP 100/52 | HR 47 | Temp 97.8°F | Ht 69.5 in | Wt 145.0 lb

## 2016-08-12 DIAGNOSIS — R7989 Other specified abnormal findings of blood chemistry: Secondary | ICD-10-CM

## 2016-08-12 DIAGNOSIS — Z0000 Encounter for general adult medical examination without abnormal findings: Secondary | ICD-10-CM

## 2016-08-12 DIAGNOSIS — R739 Hyperglycemia, unspecified: Secondary | ICD-10-CM

## 2016-08-12 LAB — GLUCOSE, RANDOM: Glucose, Bld: 152 mg/dL — ABNORMAL HIGH (ref 70–99)

## 2016-08-12 NOTE — Progress Notes (Signed)
PCP notes:  Health maintenance:  Tetanus - postponed/pt could not recall last date of vaccine  PCV13 - due to pt's hx, pt will discuss vaccine with PCP  Abnormal screenings:   Mini-Cog score: 18/20  Patient concerns:   None  Nurse concerns:  None  Next PCP appt:   08/18/16 @ 1130  I reviewed health advisor's note, was available for consultation on the day of service listed in this note, and agree with documentation and plan. Elsie Stain, MD.

## 2016-08-12 NOTE — Progress Notes (Signed)
Subjective:   Timothy Bullock is a 82 y.o. male who presents for an Initial Medicare Annual Wellness Visit.  Review of Systems  N/A Cardiac Risk Factors include: advanced age (>70mn, >>66women);male gender    Objective:    Today's Vitals   08/12/16 1442  BP: (!) 100/52  Pulse: (!) 47  Temp: 97.8 F (36.6 C)  TempSrc: Oral  SpO2: 99%  Weight: 145 lb (65.8 kg)  Height: 5' 9.5" (1.765 m)  PainSc: 0-No pain   Body mass index is 21.11 kg/m.  Current Medications (verified) Outpatient Encounter Prescriptions as of 08/12/2016  Medication Sig  . amiodarone (PACERONE) 200 MG tablet TAKE 1 TABLET BY MOUTH DAILY  . apixaban (ELIQUIS) 5 MG TABS tablet Take 1 tablet (5 mg total) by mouth 2 (two) times daily.  . [facility] SENNA 8.6 MG tablet TAKE 2 TABLETS BY MOUTH AT BEDTIME  . ferrous sulfate 325 (65 FE) MG tablet TAKE 1 TABLET BY MOUTH DAILY WITH BREAKFAST  . fludrocortisone (FLORINEF) 0.1 MG tablet Take 1 tablet (100 mcg total) by mouth daily.  .Marland Kitchenomeprazole (PRILOSEC) 40 MG capsule Take 40 mg by mouth daily.  . ondansetron (ZOFRAN) 4 MG tablet Take 1 tablet (4 mg total) by mouth every 8 (eight) hours as needed for nausea or vomiting.  . polyethylene glycol (MIRALAX) packet Take 17 g by mouth daily.  . sodium chloride 1 g tablet Take 1 g by mouth 3 (three) times daily with meals.   No facility-administered encounter medications on file as of 08/12/2016.     Allergies (verified) Patient has no known allergies.   History: Past Medical History:  Diagnosis Date  [facility]   . Bladder cancer ([facility]   . Blood in stool   . 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 Diverticulitis   . H/O cardiac radiofrequency ablation   . Paroxysmal atrial fibrillation ([facility] 05/28/2012  . [facility]    s/p [facility]  . Ulcer ([facility]    gastric  . Urinary incontinence    Past Surgical History:  Procedure Laterality Date  . BLADDER  REMOVAL    . CARDIAC CATHETERIZATION  2013   [facility]  . COLONOSCOPY  02/2012   negative pathology   . COLOSTOMY     with bladder cancer surgery 2017  . OTHER SURGICAL HISTORY     urostomy  . PERCUTANEOUS NEPHROSTOMY  2017  . PROSTATECTOMY  1993   Family History  Problem Relation Age of Onset  . Cancer Mother     H/O colon CA  . Alcohol abuse Father   . Cancer Father     CA of tongue  . Cancer Other     colon, parents <60   Social History   Occupational History  . Retired    Social History Main Topics  . Smoking status: Former Smoker    Types: Cigarettes    Quit date: 09/15/1965  . Smokeless tobacco: Never Used  . Alcohol use 0.0 oz/week     Comment: Rare  . Drug use: No  . Sexual activity: No   Tobacco Counseling Counseling given: No   Activities of Daily Living In your present state of health, do you have any difficulty performing the following activities: 08/12/2016  Hearing? Y  Vision? N  Difficulty concentrating or making decisions? N  Walking or climbing stairs? N  Dressing or bathing? N  Doing errands, shopping? N  Preparing Food  and eating ? N  Using the Toilet? N  In the past six months, have you accidently leaked urine? N  Do you have problems with loss of bowel control? N  Managing your Medications? N  Managing your Finances? N  Housekeeping or managing your Housekeeping? N  Some recent data might be hidden    Immunizations and Health Maintenance Immunization History  Administered Date(s) Administered  . Influenza Split 07/01/2011  . Influenza Whole 07/23/2010  . Influenza,inj,Quad PF,36+ Mos 06/23/2013, 08/17/2014, 07/03/2015, 06/05/2016  . Pneumococcal Polysaccharide-23 07/01/2011  . Zoster 02/12/2011   There are no preventive care reminders to display for this patient.  Patient Care Team: Timothy Ghent, MD as PCP - General Timothy Hampshire, MD as Attending Physician (Cardiology)    Assessment:   This is a routine wellness examination  for Timothy Bullock.   Hearing/Vision screen Hearing Screening Comments: Wears bilateral hearing aids Vision Screening Comments: Last vision exam in December 2016 with [facility]  Dietary issues and exercise activities discussed: Current Exercise Habits: Home exercise routine, Type of exercise: walking, Time (Minutes): 60, Frequency (Times/Week): 3, Weekly Exercise (Minutes/Week): 180, Intensity: Mild, Exercise limited by: None identified  Goals    . Increase physical activity          Starting 08/12/2016, I will continue to walk at least 60 min 3 days per week.       Depression Screen PHQ 2/9 Scores 08/12/2016  PHQ - 2 Score 0    Fall Risk Fall Risk  08/12/2016  Falls in the past year? No    Cognitive Function: MMSE - Mini Mental State Exam 08/12/2016  Orientation to time 5  Orientation to Place 5  Registration 3  Attention/ Calculation 0  Recall 1  Recall-comments pt was unable to recall 2 of 3 words  Language- name 2 objects 0  Language- repeat 1  Language- follow 3 step command 3  Language- read & follow direction 0  Write a sentence 0  Copy design 0  Total score 18     PLEASE NOTE: A Mini-Cog screen was completed. Maximum score is 20. A value of 0 denotes this part of Folstein MMSE was not completed or the patient failed this part of the Mini-Cog screening.   Mini-Cog Screening Orientation to Time - Max 5 pts Orientation to Place - Max 5 pts Registration - Max 3 pts Recall - Max 3 pts Language Repeat - Max 1 pts Language Follow 3 Step Command - Max 3 pts     Screening Tests Health Maintenance  Topic Date Due  . PNA vac Low Risk Adult (2 of 2 - PCV13) 08/12/2017 (Originally 06/30/2012)  . TETANUS/TDAP  08/12/2026 (Originally 04/22/1951)  . COLONOSCOPY  02/15/2017  . INFLUENZA VACCINE  Addressed  . ZOSTAVAX  Completed        Plan:     I have personally reviewed and addressed the Medicare Annual Wellness questionnaire and have noted the following in  the patient's chart:  A. Medical and social history B. Use of alcohol, tobacco or illicit drugs  C. Current medications and supplements D. Functional ability and status E.  Nutritional status F.  Physical activity G. Advance directives H. List of other physicians I.  Hospitalizations, surgeries, and ER visits in previous 12 months J.  [place] to include hearing, vision, cognitive, depression L. Referrals and appointments - none  In addition, I have reviewed and discussed with patient certain preventive protocols, quality metrics, and best  practice recommendations. A written personalized care plan for preventive services as well as general preventive health recommendations were provided to patient.  See attached scanned questionnaire for additional information.   Signed,   Lindell Noe, MHA, BS, LPN Health Coach

## 2016-08-12 NOTE — Patient Instructions (Signed)
Timothy Bullock , Thank you for taking time to come for your Medicare Wellness Visit. I appreciate your ongoing commitment to your health goals. Please review the following plan we discussed and let me know if I can assist you in the future.   These are the goals we discussed: Goals    . Increase physical activity          Starting 08/12/2016, I will continue to walk at least 60 min 3 days per week.        This is a list of the screening recommended for you and due dates:  Health Maintenance  Topic Date Due  . Pneumonia vaccines (2 of 2 - PCV13) 08/12/2017*  . Tetanus Vaccine  08/12/2026*  . Colon Cancer Screening  02/15/2017  . Flu Shot  Addressed  . Shingles Vaccine  Completed  *Topic was postponed. The date shown is not the original due date.   Preventive Care for Adults  A healthy lifestyle and preventive care can promote health and wellness. Preventive health guidelines for adults include the following key practices.  . A routine yearly physical is a good way to check with your health care provider about your health and preventive screening. It is a chance to share any concerns and updates on your health and to receive a thorough exam.  . Visit your dentist for a routine exam and preventive care every 6 months. Brush your teeth twice a day and floss once a day. Good oral hygiene prevents tooth decay and gum disease.  . The frequency of eye exams is based on your age, health, family medical history, use  of contact lenses, and other factors. Follow your health care provider's ecommendations for frequency of eye exams.  . Eat a healthy diet. Foods like vegetables, fruits, whole grains, low-fat dairy products, and lean protein foods contain the nutrients you need without too many calories. Decrease your intake of foods high in solid fats, added sugars, and salt. Eat the right amount of calories for you. Get information about a proper diet from your health care provider, if  necessary.  . Regular physical exercise is one of the most important things you can do for your health. Most adults should get at least 150 minutes of moderate-intensity exercise (any activity that increases your heart rate and causes you to sweat) each week. In addition, most adults need muscle-strengthening exercises on 2 or more days a week.  Silver Sneakers may be a benefit available to you. To determine eligibility, you may visit the website: www.silversneakers.com or contact program at 925-222-2137 Mon-Fri between 8AM-8PM.   . Maintain a healthy weight. The body mass index (BMI) is a screening tool to identify possible weight problems. It provides an estimate of body fat based on height and weight. Your health care provider can find your BMI and can help you achieve or maintain a healthy weight.   For adults 20 years and older: ? A BMI below 18.5 is considered underweight. ? A BMI of 18.5 to 24.9 is normal. ? A BMI of 25 to 29.9 is considered overweight. ? A BMI of 30 and above is considered obese.   . Maintain normal blood lipids and cholesterol levels by exercising and minimizing your intake of saturated fat. Eat a balanced diet with plenty of fruit and vegetables. Blood tests for lipids and cholesterol should begin at age 36 and be repeated every 5 years. If your lipid or cholesterol levels are high, you are over 50, or  you are at high risk for heart disease, you may need your cholesterol levels checked more frequently. Ongoing high lipid and cholesterol levels should be treated with medicines if diet and exercise are not working.  . If you smoke, find out from your health care provider how to quit. If you do not use tobacco, please do not start.  . If you choose to drink alcohol, please do not consume more than 2 drinks per day. One drink is considered to be 12 ounces (355 mL) of beer, 5 ounces (148 mL) of wine, or 1.5 ounces (44 mL) of liquor.  . If you are 62-79 years old, ask your  health care provider if you should take aspirin to prevent strokes.  . Use sunscreen. Apply sunscreen liberally and repeatedly throughout the day. You should seek shade when your shadow is shorter than you. Protect yourself by wearing long sleeves, pants, a wide-brimmed hat, and sunglasses year round, whenever you are outdoors.  . Once a month, do a whole body skin exam, using a mirror to look at the skin on your back. Tell your health care provider of new moles, moles that have irregular borders, moles that are larger than a pencil eraser, or moles that have changed in shape or color.

## 2016-08-12 NOTE — Progress Notes (Signed)
Pre visit review using our clinic review tool, if applicable. No additional management support is needed unless otherwise documented below in the visit note. 

## 2016-08-13 LAB — CBC WITH DIFFERENTIAL/PLATELET
BASOS PCT: 0.4 % (ref 0.0–3.0)
Basophils Absolute: 0 10*3/uL (ref 0.0–0.1)
EOS PCT: 0 % (ref 0.0–5.0)
Eosinophils Absolute: 0 10*3/uL (ref 0.0–0.7)
HCT: 30.5 % — ABNORMAL LOW (ref 39.0–52.0)
HEMOGLOBIN: 10.1 g/dL — AB (ref 13.0–17.0)
Lymphocytes Relative: 13.9 % (ref 12.0–46.0)
Lymphs Abs: 0.9 10*3/uL (ref 0.7–4.0)
MCHC: 33.2 g/dL (ref 30.0–36.0)
MCV: 91.7 fl (ref 78.0–100.0)
MONOS PCT: 9.8 % (ref 3.0–12.0)
Monocytes Absolute: 0.7 10*3/uL (ref 0.1–1.0)
Neutro Abs: 5.1 10*3/uL (ref 1.4–7.7)
Neutrophils Relative %: 75.9 % (ref 43.0–77.0)
Platelets: 376 10*3/uL (ref 150.0–400.0)
RBC: 3.33 Mil/uL — AB (ref 4.22–5.81)
RDW: 15.3 % (ref 11.5–15.5)
WBC: 6.7 10*3/uL (ref 4.0–10.5)

## 2016-08-18 ENCOUNTER — Encounter: Payer: Self-pay | Admitting: Family Medicine

## 2016-08-18 ENCOUNTER — Ambulatory Visit (INDEPENDENT_AMBULATORY_CARE_PROVIDER_SITE_OTHER): Payer: Medicare Other | Admitting: Family Medicine

## 2016-08-18 VITALS — BP 124/58 | HR 51 | Temp 97.7°F | Ht 70.0 in | Wt 146.5 lb

## 2016-08-18 DIAGNOSIS — D649 Anemia, unspecified: Secondary | ICD-10-CM | POA: Diagnosis not present

## 2016-08-18 DIAGNOSIS — I959 Hypotension, unspecified: Secondary | ICD-10-CM | POA: Diagnosis not present

## 2016-08-18 DIAGNOSIS — C679 Malignant neoplasm of bladder, unspecified: Secondary | ICD-10-CM

## 2016-08-18 LAB — IBC PANEL
Iron: 41 ug/dL — ABNORMAL LOW (ref 42–165)
Saturation Ratios: 14.3 % — ABNORMAL LOW (ref 20.0–50.0)
Transferrin: 205 mg/dL — ABNORMAL LOW (ref 212.0–360.0)

## 2016-08-18 LAB — CBC WITH DIFFERENTIAL/PLATELET
BASOS PCT: 0.4 % (ref 0.0–3.0)
Basophils Absolute: 0 10*3/uL (ref 0.0–0.1)
EOS PCT: 0 % (ref 0.0–5.0)
Eosinophils Absolute: 0 10*3/uL (ref 0.0–0.7)
HCT: 32.4 % — ABNORMAL LOW (ref 39.0–52.0)
Hemoglobin: 10.7 g/dL — ABNORMAL LOW (ref 13.0–17.0)
LYMPHS ABS: 1.2 10*3/uL (ref 0.7–4.0)
Lymphocytes Relative: 19.2 % (ref 12.0–46.0)
MCHC: 33 g/dL (ref 30.0–36.0)
MCV: 92.1 fl (ref 78.0–100.0)
MONOS PCT: 8.8 % (ref 3.0–12.0)
Monocytes Absolute: 0.5 10*3/uL (ref 0.1–1.0)
NEUTROS PCT: 71.6 % (ref 43.0–77.0)
Neutro Abs: 4.5 10*3/uL (ref 1.4–7.7)
Platelets: 327 10*3/uL (ref 150.0–400.0)
RBC: 3.52 Mil/uL — AB (ref 4.22–5.81)
RDW: 15.1 % (ref 11.5–15.5)
WBC: 6.2 10*3/uL (ref 4.0–10.5)

## 2016-08-18 MED ORDER — OMEPRAZOLE 40 MG PO CPDR: 40.0000 mg | DELAYED_RELEASE_CAPSULE | Freq: Every day | ORAL | 3 refills | Status: DC

## 2016-08-18 MED ORDER — POLYETHYLENE GLYCOL 3350 17 G PO PACK: 17.0000 g | PACK | Freq: Every day | ORAL | 3 refills | Status: DC

## 2016-08-18 MED ORDER — SODIUM CHLORIDE 1 G PO TABS: 1.0000 g | ORAL_TABLET | Freq: Three times a day (TID) | ORAL | 3 refills | Status: DC

## 2016-08-18 MED ORDER — FLUDROCORTISONE ACETATE 0.1 MG PO TABS: 100.0000 ug | ORAL_TABLET | Freq: Every day | ORAL | 3 refills | Status: DC

## 2016-08-18 NOTE — Patient Instructions (Addendum)
Ask [facility] about PNA-13 (prevnar).   Go to the lab on the way out.  We'll contact you with your lab report. Restart sodium pills.  Take care.  Glad to see you.  Update me as needed.   I'll await your notes from [facility].

## 2016-08-18 NOTE — Progress Notes (Signed)
S/p extensive bladder surgery. Today has been a good day.  His appetite is good and he has more energy recently.   He has f/u with [facility] pending with repeat imaging pending.   No fevers, no chills, no known passed blood in stool or urine.  Anemia recently noted on labs. Some black stools but already on iron and this could contribute to the dark stools.    Low BP.  Not lightheaded.  Only recently off NaCl pills, off for 2 days, still on florinef.  BP has been 110-140/50-70s at home recently.    Prev ER visit with dec stool output. Resolved as soon as he got home, with stool in colostomy.  Prev on Cipro for UTI, done with abx now.    Prev MMSE 18/20.  He recalls the difficulty at the time of the exam.  D/w pt about it today.  Totally normal testing today with crisp recall today.  Neither patient nor I think he has a memory problem.  Dw pt.   3/3 attention and recall, A&Ox3, can do math, read a watch, etc.    He is moving to [place] in the near future.    Tetanus about 5 years ago per patient report.   PNA vaccine d/w pt.  See AVS.    PMH and SH reviewed  ROS: Per HPI unless specifically indicated in ROS section   Meds, vitals, and allergies reviewed.   GEN: nad, alert and oriented HEENT: mucous membranes moist NECK: supple w/o LA CV: rrr PULM: ctab, no inc wob ABD: soft, +bs EXT: no edema SKIN: no acute rash Colostomy and urostomy noted, intact.

## 2016-08-18 NOTE — Progress Notes (Signed)
Pre visit review using our clinic review tool, if applicable. No additional management support is needed unless otherwise documented below in the visit note. 

## 2016-08-19 DIAGNOSIS — I959 Hypotension, unspecified: Secondary | ICD-10-CM | POA: Insufficient documentation

## 2016-08-19 DIAGNOSIS — D649 Anemia, unspecified: Secondary | ICD-10-CM | POA: Insufficient documentation

## 2016-08-19 NOTE — Assessment & Plan Note (Signed)
Still with relative deconditioning, but improved from post op baseline and moving to [facility].  Has f/u with [facility] pending, re: clinic eval and imaging.  I'll await notes.  App help of all involved.

## 2016-08-19 NOTE — Assessment & Plan Note (Signed)
Only recently off NaCl pills, off for 2 days, still on florinef.  BP has been 110-140/50-70s at home recently.  Would continue both NaCl and florinef.  D/w pt.  He agrees.  Not lightheaded.

## 2016-08-19 NOTE — Assessment & Plan Note (Addendum)
Presumed iron def, still on iron, anticoagulated at this point.  As long as hgb >10, then likely risk/benefit of anticoagulation in favor of anticoagulation.  No gross blood.  I presume him to have some GI loss but still could have some urinary loss though no gross hematuria.  See notes on f/u labs.  Will ask for input from [facility] clinic in the meantime.  >25 minutes spent in face to face time with patient, >50% spent in counselling or coordination of care.

## 2016-08-29 DIAGNOSIS — R3914 Feeling of incomplete bladder emptying: Secondary | ICD-10-CM | POA: Diagnosis not present

## 2016-08-29 DIAGNOSIS — Z87891 Personal history of nicotine dependence: Secondary | ICD-10-CM | POA: Diagnosis not present

## 2016-08-29 DIAGNOSIS — Z5181 Encounter for therapeutic drug level monitoring: Secondary | ICD-10-CM | POA: Diagnosis not present

## 2016-08-29 DIAGNOSIS — I4510 Unspecified right bundle-branch block: Secondary | ICD-10-CM | POA: Diagnosis not present

## 2016-08-29 DIAGNOSIS — Z800 Family history of malignant neoplasm of digestive organs: Secondary | ICD-10-CM | POA: Diagnosis not present

## 2016-08-29 DIAGNOSIS — R001 Bradycardia, unspecified: Secondary | ICD-10-CM | POA: Diagnosis not present

## 2016-08-29 DIAGNOSIS — Z8546 Personal history of malignant neoplasm of prostate: Secondary | ICD-10-CM | POA: Diagnosis not present

## 2016-08-29 DIAGNOSIS — Z6821 Body mass index (BMI) 21.0-21.9, adult: Secondary | ICD-10-CM | POA: Diagnosis not present

## 2016-08-29 DIAGNOSIS — C679 Malignant neoplasm of bladder, unspecified: Secondary | ICD-10-CM | POA: Diagnosis not present

## 2016-08-29 DIAGNOSIS — Z7901 Long term (current) use of anticoagulants: Secondary | ICD-10-CM | POA: Diagnosis not present

## 2016-08-29 DIAGNOSIS — Z79899 Other long term (current) drug therapy: Secondary | ICD-10-CM | POA: Diagnosis not present

## 2016-08-29 DIAGNOSIS — I480 Paroxysmal atrial fibrillation: Secondary | ICD-10-CM | POA: Diagnosis not present

## 2016-08-29 DIAGNOSIS — I951 Orthostatic hypotension: Secondary | ICD-10-CM | POA: Diagnosis not present

## 2016-08-29 DIAGNOSIS — Z933 Colostomy status: Secondary | ICD-10-CM | POA: Diagnosis not present

## 2016-09-01 DIAGNOSIS — R3914 Feeling of incomplete bladder emptying: Secondary | ICD-10-CM | POA: Diagnosis not present

## 2016-09-01 DIAGNOSIS — Z8546 Personal history of malignant neoplasm of prostate: Secondary | ICD-10-CM | POA: Diagnosis not present

## 2016-09-01 DIAGNOSIS — R918 Other nonspecific abnormal finding of lung field: Secondary | ICD-10-CM | POA: Diagnosis not present

## 2016-09-01 DIAGNOSIS — M8440XD Pathological fracture, unspecified site, subsequent encounter for fracture with routine healing: Secondary | ICD-10-CM | POA: Diagnosis not present

## 2016-09-01 DIAGNOSIS — C679 Malignant neoplasm of bladder, unspecified: Secondary | ICD-10-CM | POA: Diagnosis not present

## 2016-09-01 DIAGNOSIS — Z79899 Other long term (current) drug therapy: Secondary | ICD-10-CM | POA: Diagnosis not present

## 2016-09-01 DIAGNOSIS — Z7901 Long term (current) use of anticoagulants: Secondary | ICD-10-CM | POA: Diagnosis not present

## 2016-09-01 DIAGNOSIS — Z6821 Body mass index (BMI) 21.0-21.9, adult: Secondary | ICD-10-CM | POA: Diagnosis not present

## 2016-09-01 DIAGNOSIS — R590 Localized enlarged lymph nodes: Secondary | ICD-10-CM | POA: Diagnosis not present

## 2016-09-01 DIAGNOSIS — Z87891 Personal history of nicotine dependence: Secondary | ICD-10-CM | POA: Diagnosis not present

## 2016-09-01 DIAGNOSIS — H9190 Unspecified hearing loss, unspecified ear: Secondary | ICD-10-CM | POA: Diagnosis not present

## 2016-09-01 DIAGNOSIS — C7989 Secondary malignant neoplasm of other specified sites: Secondary | ICD-10-CM | POA: Diagnosis not present

## 2016-09-01 DIAGNOSIS — I482 Chronic atrial fibrillation: Secondary | ICD-10-CM | POA: Diagnosis not present

## 2016-09-04 ENCOUNTER — Telehealth: Payer: Self-pay | Admitting: Family Medicine

## 2016-09-04 NOTE — Telephone Encounter (Signed)
Please contact or send message to Dr. Kalman Shan at [facility].  Are they going to follow his anemia?  Do we at [facility] need to follow or are they going to handle that?  If they are, I'll defer with appreciation for their help.  Thanks.

## 2016-09-21 NOTE — Telephone Encounter (Signed)
See below. I don't recall getting a specific answer to this question. Please ask if they are going to follow his anemia, or if there is anything they need [place] to do. I will defer to them and I appreciate their help. Thanks.   [facility] ONCOLOGY MULTIDISCIPLINARY [address] [facility]  [place], [place] [address]  [phone]  Tora Perches, MD  [place], [place] [address]

## 2016-09-22 NOTE — Telephone Encounter (Signed)
Message routed to Tessie Eke, M.D.

## 2016-09-24 DIAGNOSIS — M545 Low back pain: Secondary | ICD-10-CM | POA: Diagnosis not present

## 2016-09-24 DIAGNOSIS — C61 Malignant neoplasm of prostate: Secondary | ICD-10-CM | POA: Diagnosis not present

## 2016-09-24 DIAGNOSIS — I482 Chronic atrial fibrillation: Secondary | ICD-10-CM | POA: Diagnosis not present

## 2016-09-24 DIAGNOSIS — C7989 Secondary malignant neoplasm of other specified sites: Secondary | ICD-10-CM | POA: Diagnosis not present

## 2016-09-24 DIAGNOSIS — C679 Malignant neoplasm of bladder, unspecified: Secondary | ICD-10-CM | POA: Diagnosis not present

## 2016-09-24 DIAGNOSIS — Z87891 Personal history of nicotine dependence: Secondary | ICD-10-CM | POA: Diagnosis not present

## 2016-09-24 DIAGNOSIS — Z8546 Personal history of malignant neoplasm of prostate: Secondary | ICD-10-CM | POA: Diagnosis not present

## 2016-09-24 DIAGNOSIS — Z6821 Body mass index (BMI) 21.0-21.9, adult: Secondary | ICD-10-CM | POA: Diagnosis not present

## 2016-10-03 DIAGNOSIS — Q251 Coarctation of aorta: Secondary | ICD-10-CM | POA: Diagnosis not present

## 2016-10-03 DIAGNOSIS — R9082 White matter disease, unspecified: Secondary | ICD-10-CM | POA: Diagnosis not present

## 2016-10-03 DIAGNOSIS — C679 Malignant neoplasm of bladder, unspecified: Secondary | ICD-10-CM | POA: Diagnosis not present

## 2016-10-03 DIAGNOSIS — R918 Other nonspecific abnormal finding of lung field: Secondary | ICD-10-CM | POA: Diagnosis not present

## 2016-10-03 DIAGNOSIS — I4510 Unspecified right bundle-branch block: Secondary | ICD-10-CM | POA: Diagnosis not present

## 2016-10-03 DIAGNOSIS — I340 Nonrheumatic mitral (valve) insufficiency: Secondary | ICD-10-CM | POA: Diagnosis not present

## 2016-10-03 DIAGNOSIS — R001 Bradycardia, unspecified: Secondary | ICD-10-CM | POA: Diagnosis not present

## 2016-10-03 DIAGNOSIS — R9431 Abnormal electrocardiogram [ECG] [EKG]: Secondary | ICD-10-CM | POA: Diagnosis not present

## 2016-10-03 DIAGNOSIS — R9089 Other abnormal findings on diagnostic imaging of central nervous system: Secondary | ICD-10-CM | POA: Diagnosis not present

## 2016-10-06 ENCOUNTER — Other Ambulatory Visit: Payer: Self-pay | Admitting: *Deleted

## 2016-10-06 DIAGNOSIS — I480 Paroxysmal atrial fibrillation: Secondary | ICD-10-CM

## 2016-10-06 MED ORDER — AMIODARONE HCL 200 MG PO TABS: 200.0000 mg | ORAL_TABLET | Freq: Every day | ORAL | 1 refills | Status: DC

## 2016-10-06 MED ORDER — APIXABAN 5 MG PO TABS: 5.0000 mg | ORAL_TABLET | Freq: Two times a day (BID) | ORAL | 1 refills | Status: DC

## 2016-10-06 MED ORDER — FLUDROCORTISONE ACETATE 0.1 MG PO TABS: 100.0000 ug | ORAL_TABLET | Freq: Every day | ORAL | 0 refills | Status: DC

## 2016-10-06 NOTE — Telephone Encounter (Signed)
Please send letter per our conversation since no response.

## 2016-10-07 ENCOUNTER — Other Ambulatory Visit: Payer: Self-pay

## 2016-10-07 MED ORDER — SENNOSIDES 8.6 MG PO TABS: 2.0000 | ORAL_TABLET | Freq: Every day | ORAL | 1 refills | Status: DC

## 2016-10-08 NOTE — Telephone Encounter (Signed)
Letter mailed as instructed.

## 2016-10-08 NOTE — Telephone Encounter (Signed)
Letter done.  Please make sure it printed.  Thanks.

## 2016-10-22 DIAGNOSIS — Z7901 Long term (current) use of anticoagulants: Secondary | ICD-10-CM | POA: Diagnosis not present

## 2016-10-22 DIAGNOSIS — Z9889 Other specified postprocedural states: Secondary | ICD-10-CM | POA: Diagnosis not present

## 2016-10-22 DIAGNOSIS — Z79899 Other long term (current) drug therapy: Secondary | ICD-10-CM | POA: Diagnosis not present

## 2016-10-22 DIAGNOSIS — Z006 Encounter for examination for normal comparison and control in clinical research program: Secondary | ICD-10-CM | POA: Diagnosis not present

## 2016-10-22 DIAGNOSIS — Z6821 Body mass index (BMI) 21.0-21.9, adult: Secondary | ICD-10-CM | POA: Diagnosis not present

## 2016-10-22 DIAGNOSIS — I482 Chronic atrial fibrillation: Secondary | ICD-10-CM | POA: Diagnosis not present

## 2016-10-22 DIAGNOSIS — C775 Secondary and unspecified malignant neoplasm of intrapelvic lymph nodes: Secondary | ICD-10-CM | POA: Diagnosis not present

## 2016-10-22 DIAGNOSIS — Z923 Personal history of irradiation: Secondary | ICD-10-CM | POA: Diagnosis not present

## 2016-10-22 DIAGNOSIS — Z87891 Personal history of nicotine dependence: Secondary | ICD-10-CM | POA: Diagnosis not present

## 2016-10-22 DIAGNOSIS — Z5112 Encounter for antineoplastic immunotherapy: Secondary | ICD-10-CM | POA: Diagnosis not present

## 2016-10-22 DIAGNOSIS — C785 Secondary malignant neoplasm of large intestine and rectum: Secondary | ICD-10-CM | POA: Diagnosis not present

## 2016-10-22 DIAGNOSIS — Z8546 Personal history of malignant neoplasm of prostate: Secondary | ICD-10-CM | POA: Diagnosis not present

## 2016-10-22 DIAGNOSIS — C679 Malignant neoplasm of bladder, unspecified: Secondary | ICD-10-CM | POA: Diagnosis not present

## 2016-10-28 DIAGNOSIS — C679 Malignant neoplasm of bladder, unspecified: Secondary | ICD-10-CM | POA: Diagnosis not present

## 2016-10-28 DIAGNOSIS — Z452 Encounter for adjustment and management of vascular access device: Secondary | ICD-10-CM | POA: Diagnosis not present

## 2016-10-28 DIAGNOSIS — Z79899 Other long term (current) drug therapy: Secondary | ICD-10-CM | POA: Diagnosis not present

## 2016-10-28 DIAGNOSIS — Z006 Encounter for examination for normal comparison and control in clinical research program: Secondary | ICD-10-CM | POA: Diagnosis not present

## 2016-10-28 DIAGNOSIS — R3914 Feeling of incomplete bladder emptying: Secondary | ICD-10-CM | POA: Diagnosis not present

## 2016-10-28 DIAGNOSIS — I482 Chronic atrial fibrillation: Secondary | ICD-10-CM | POA: Diagnosis not present

## 2016-11-05 DIAGNOSIS — C61 Malignant neoplasm of prostate: Secondary | ICD-10-CM | POA: Diagnosis not present

## 2016-11-05 DIAGNOSIS — Z006 Encounter for examination for normal comparison and control in clinical research program: Secondary | ICD-10-CM | POA: Diagnosis not present

## 2016-11-12 DIAGNOSIS — Z87891 Personal history of nicotine dependence: Secondary | ICD-10-CM | POA: Diagnosis not present

## 2016-11-12 DIAGNOSIS — Z7901 Long term (current) use of anticoagulants: Secondary | ICD-10-CM | POA: Diagnosis not present

## 2016-11-12 DIAGNOSIS — C61 Malignant neoplasm of prostate: Secondary | ICD-10-CM | POA: Diagnosis not present

## 2016-11-12 DIAGNOSIS — Z6820 Body mass index (BMI) 20.0-20.9, adult: Secondary | ICD-10-CM | POA: Diagnosis not present

## 2016-11-12 DIAGNOSIS — I482 Chronic atrial fibrillation: Secondary | ICD-10-CM | POA: Diagnosis not present

## 2016-11-12 DIAGNOSIS — Z79899 Other long term (current) drug therapy: Secondary | ICD-10-CM | POA: Diagnosis not present

## 2016-11-12 DIAGNOSIS — Z006 Encounter for examination for normal comparison and control in clinical research program: Secondary | ICD-10-CM | POA: Diagnosis not present

## 2016-11-12 DIAGNOSIS — C7989 Secondary malignant neoplasm of other specified sites: Secondary | ICD-10-CM | POA: Diagnosis not present

## 2016-11-19 DIAGNOSIS — Z79899 Other long term (current) drug therapy: Secondary | ICD-10-CM | POA: Diagnosis not present

## 2016-11-19 DIAGNOSIS — Z87891 Personal history of nicotine dependence: Secondary | ICD-10-CM | POA: Diagnosis not present

## 2016-11-19 DIAGNOSIS — Z8546 Personal history of malignant neoplasm of prostate: Secondary | ICD-10-CM | POA: Diagnosis not present

## 2016-11-19 DIAGNOSIS — C7989 Secondary malignant neoplasm of other specified sites: Secondary | ICD-10-CM | POA: Diagnosis not present

## 2016-11-19 DIAGNOSIS — Z6821 Body mass index (BMI) 21.0-21.9, adult: Secondary | ICD-10-CM | POA: Diagnosis not present

## 2016-11-19 DIAGNOSIS — Z5112 Encounter for antineoplastic immunotherapy: Secondary | ICD-10-CM | POA: Diagnosis not present

## 2016-11-19 DIAGNOSIS — Z006 Encounter for examination for normal comparison and control in clinical research program: Secondary | ICD-10-CM | POA: Diagnosis not present

## 2016-11-19 DIAGNOSIS — Z87440 Personal history of urinary (tract) infections: Secondary | ICD-10-CM | POA: Diagnosis not present

## 2016-11-19 DIAGNOSIS — Z5111 Encounter for antineoplastic chemotherapy: Secondary | ICD-10-CM | POA: Diagnosis not present

## 2016-11-19 DIAGNOSIS — C679 Malignant neoplasm of bladder, unspecified: Secondary | ICD-10-CM | POA: Diagnosis not present

## 2016-11-19 DIAGNOSIS — Z7901 Long term (current) use of anticoagulants: Secondary | ICD-10-CM | POA: Diagnosis not present

## 2016-11-19 DIAGNOSIS — I482 Chronic atrial fibrillation: Secondary | ICD-10-CM | POA: Diagnosis not present

## 2016-11-19 DIAGNOSIS — Z808 Family history of malignant neoplasm of other organs or systems: Secondary | ICD-10-CM | POA: Diagnosis not present

## 2016-11-19 DIAGNOSIS — R3914 Feeling of incomplete bladder emptying: Secondary | ICD-10-CM | POA: Diagnosis not present

## 2016-11-19 DIAGNOSIS — Z906 Acquired absence of other parts of urinary tract: Secondary | ICD-10-CM | POA: Diagnosis not present

## 2016-11-19 DIAGNOSIS — Z8619 Personal history of other infectious and parasitic diseases: Secondary | ICD-10-CM | POA: Diagnosis not present

## 2016-11-19 DIAGNOSIS — I10 Essential (primary) hypertension: Secondary | ICD-10-CM | POA: Diagnosis not present

## 2016-11-19 DIAGNOSIS — Z800 Family history of malignant neoplasm of digestive organs: Secondary | ICD-10-CM | POA: Diagnosis not present

## 2016-11-19 DIAGNOSIS — Z923 Personal history of irradiation: Secondary | ICD-10-CM | POA: Diagnosis not present

## 2016-11-26 DIAGNOSIS — Z5111 Encounter for antineoplastic chemotherapy: Secondary | ICD-10-CM | POA: Diagnosis not present

## 2016-11-26 DIAGNOSIS — C679 Malignant neoplasm of bladder, unspecified: Secondary | ICD-10-CM | POA: Diagnosis not present

## 2016-11-26 DIAGNOSIS — Z006 Encounter for examination for normal comparison and control in clinical research program: Secondary | ICD-10-CM | POA: Diagnosis not present

## 2016-11-26 DIAGNOSIS — Z7689 Persons encountering health services in other specified circumstances: Secondary | ICD-10-CM | POA: Diagnosis not present

## 2016-11-28 ENCOUNTER — Other Ambulatory Visit: Payer: Self-pay | Admitting: *Deleted

## 2016-11-28 MED ORDER — FERROUS SULFATE 325 (65 FE) MG PO TABS: 325.0000 mg | ORAL_TABLET | Freq: Every day | ORAL | 3 refills | Status: DC

## 2016-11-28 NOTE — Telephone Encounter (Signed)
Received faxed refill request from pharmacy for Ferrous sulfate Refill sent to pharmacy electronically.

## 2016-12-03 DIAGNOSIS — C7989 Secondary malignant neoplasm of other specified sites: Secondary | ICD-10-CM | POA: Diagnosis not present

## 2016-12-03 DIAGNOSIS — C679 Malignant neoplasm of bladder, unspecified: Secondary | ICD-10-CM | POA: Diagnosis not present

## 2016-12-10 DIAGNOSIS — Z9889 Other specified postprocedural states: Secondary | ICD-10-CM | POA: Diagnosis not present

## 2016-12-10 DIAGNOSIS — Z808 Family history of malignant neoplasm of other organs or systems: Secondary | ICD-10-CM | POA: Diagnosis not present

## 2016-12-10 DIAGNOSIS — Z006 Encounter for examination for normal comparison and control in clinical research program: Secondary | ICD-10-CM | POA: Diagnosis not present

## 2016-12-10 DIAGNOSIS — Z87891 Personal history of nicotine dependence: Secondary | ICD-10-CM | POA: Diagnosis not present

## 2016-12-10 DIAGNOSIS — I10 Essential (primary) hypertension: Secondary | ICD-10-CM | POA: Diagnosis not present

## 2016-12-10 DIAGNOSIS — D414 Neoplasm of uncertain behavior of bladder: Secondary | ICD-10-CM | POA: Diagnosis not present

## 2016-12-10 DIAGNOSIS — Z87440 Personal history of urinary (tract) infections: Secondary | ICD-10-CM | POA: Diagnosis not present

## 2016-12-10 DIAGNOSIS — Z906 Acquired absence of other parts of urinary tract: Secondary | ICD-10-CM | POA: Diagnosis not present

## 2016-12-10 DIAGNOSIS — Z1501 Genetic susceptibility to malignant neoplasm of breast: Secondary | ICD-10-CM | POA: Diagnosis not present

## 2016-12-10 DIAGNOSIS — Z800 Family history of malignant neoplasm of digestive organs: Secondary | ICD-10-CM | POA: Diagnosis not present

## 2016-12-10 DIAGNOSIS — Z7901 Long term (current) use of anticoagulants: Secondary | ICD-10-CM | POA: Diagnosis not present

## 2016-12-10 DIAGNOSIS — Z87448 Personal history of other diseases of urinary system: Secondary | ICD-10-CM | POA: Diagnosis not present

## 2016-12-10 DIAGNOSIS — I482 Chronic atrial fibrillation: Secondary | ICD-10-CM | POA: Diagnosis not present

## 2016-12-10 DIAGNOSIS — Z79899 Other long term (current) drug therapy: Secondary | ICD-10-CM | POA: Diagnosis not present

## 2016-12-10 DIAGNOSIS — R110 Nausea: Secondary | ICD-10-CM | POA: Diagnosis not present

## 2016-12-10 DIAGNOSIS — Z8546 Personal history of malignant neoplasm of prostate: Secondary | ICD-10-CM | POA: Diagnosis not present

## 2016-12-10 DIAGNOSIS — Z9079 Acquired absence of other genital organ(s): Secondary | ICD-10-CM | POA: Diagnosis not present

## 2016-12-10 DIAGNOSIS — C679 Malignant neoplasm of bladder, unspecified: Secondary | ICD-10-CM | POA: Diagnosis not present

## 2016-12-10 DIAGNOSIS — Z5111 Encounter for antineoplastic chemotherapy: Secondary | ICD-10-CM | POA: Diagnosis not present

## 2016-12-10 DIAGNOSIS — C7989 Secondary malignant neoplasm of other specified sites: Secondary | ICD-10-CM | POA: Diagnosis not present

## 2016-12-10 DIAGNOSIS — Z923 Personal history of irradiation: Secondary | ICD-10-CM | POA: Diagnosis not present

## 2016-12-10 DIAGNOSIS — Z7952 Long term (current) use of systemic steroids: Secondary | ICD-10-CM | POA: Diagnosis not present

## 2016-12-10 DIAGNOSIS — Z9049 Acquired absence of other specified parts of digestive tract: Secondary | ICD-10-CM | POA: Diagnosis not present

## 2016-12-15 DIAGNOSIS — C7989 Secondary malignant neoplasm of other specified sites: Secondary | ICD-10-CM | POA: Diagnosis not present

## 2016-12-15 DIAGNOSIS — C679 Malignant neoplasm of bladder, unspecified: Secondary | ICD-10-CM | POA: Diagnosis not present

## 2016-12-17 DIAGNOSIS — I10 Essential (primary) hypertension: Secondary | ICD-10-CM | POA: Diagnosis not present

## 2016-12-17 DIAGNOSIS — Z8546 Personal history of malignant neoplasm of prostate: Secondary | ICD-10-CM | POA: Diagnosis not present

## 2016-12-17 DIAGNOSIS — Z79899 Other long term (current) drug therapy: Secondary | ICD-10-CM | POA: Diagnosis not present

## 2016-12-17 DIAGNOSIS — Z923 Personal history of irradiation: Secondary | ICD-10-CM | POA: Diagnosis not present

## 2016-12-17 DIAGNOSIS — Z8619 Personal history of other infectious and parasitic diseases: Secondary | ICD-10-CM | POA: Diagnosis not present

## 2016-12-17 DIAGNOSIS — Z9079 Acquired absence of other genital organ(s): Secondary | ICD-10-CM | POA: Diagnosis not present

## 2016-12-17 DIAGNOSIS — Z7901 Long term (current) use of anticoagulants: Secondary | ICD-10-CM | POA: Diagnosis not present

## 2016-12-17 DIAGNOSIS — C679 Malignant neoplasm of bladder, unspecified: Secondary | ICD-10-CM | POA: Diagnosis not present

## 2016-12-17 DIAGNOSIS — Z006 Encounter for examination for normal comparison and control in clinical research program: Secondary | ICD-10-CM | POA: Diagnosis not present

## 2016-12-17 DIAGNOSIS — Z5111 Encounter for antineoplastic chemotherapy: Secondary | ICD-10-CM | POA: Diagnosis not present

## 2016-12-17 DIAGNOSIS — Z87440 Personal history of urinary (tract) infections: Secondary | ICD-10-CM | POA: Diagnosis not present

## 2016-12-17 DIAGNOSIS — Z906 Acquired absence of other parts of urinary tract: Secondary | ICD-10-CM | POA: Diagnosis not present

## 2016-12-17 DIAGNOSIS — R5383 Other fatigue: Secondary | ICD-10-CM | POA: Diagnosis not present

## 2016-12-17 DIAGNOSIS — R0602 Shortness of breath: Secondary | ICD-10-CM | POA: Diagnosis not present

## 2016-12-17 DIAGNOSIS — Z87891 Personal history of nicotine dependence: Secondary | ICD-10-CM | POA: Diagnosis not present

## 2016-12-17 DIAGNOSIS — Z209 Contact with and (suspected) exposure to unspecified communicable disease: Secondary | ICD-10-CM | POA: Diagnosis not present

## 2016-12-17 DIAGNOSIS — Z833 Family history of diabetes mellitus: Secondary | ICD-10-CM | POA: Diagnosis not present

## 2016-12-17 DIAGNOSIS — Z681 Body mass index (BMI) 19 or less, adult: Secondary | ICD-10-CM | POA: Diagnosis not present

## 2016-12-17 DIAGNOSIS — I482 Chronic atrial fibrillation: Secondary | ICD-10-CM | POA: Diagnosis not present

## 2016-12-17 DIAGNOSIS — C775 Secondary and unspecified malignant neoplasm of intrapelvic lymph nodes: Secondary | ICD-10-CM | POA: Diagnosis not present

## 2016-12-24 DIAGNOSIS — R590 Localized enlarged lymph nodes: Secondary | ICD-10-CM | POA: Diagnosis not present

## 2016-12-24 DIAGNOSIS — C61 Malignant neoplasm of prostate: Secondary | ICD-10-CM | POA: Diagnosis not present

## 2016-12-24 DIAGNOSIS — R918 Other nonspecific abnormal finding of lung field: Secondary | ICD-10-CM | POA: Diagnosis not present

## 2016-12-24 DIAGNOSIS — M8458XA Pathological fracture in neoplastic disease, other specified site, initial encounter for fracture: Secondary | ICD-10-CM | POA: Diagnosis not present

## 2016-12-29 DIAGNOSIS — C678 Malignant neoplasm of overlapping sites of bladder: Secondary | ICD-10-CM | POA: Diagnosis not present

## 2016-12-31 ENCOUNTER — Telehealth: Payer: Self-pay

## 2016-12-31 DIAGNOSIS — Z9889 Other specified postprocedural states: Secondary | ICD-10-CM | POA: Insufficient documentation

## 2016-12-31 DIAGNOSIS — Z006 Encounter for examination for normal comparison and control in clinical research program: Secondary | ICD-10-CM | POA: Diagnosis not present

## 2016-12-31 DIAGNOSIS — I482 Chronic atrial fibrillation: Secondary | ICD-10-CM | POA: Diagnosis not present

## 2016-12-31 DIAGNOSIS — D414 Neoplasm of uncertain behavior of bladder: Secondary | ICD-10-CM | POA: Diagnosis not present

## 2016-12-31 DIAGNOSIS — I10 Essential (primary) hypertension: Secondary | ICD-10-CM | POA: Diagnosis not present

## 2016-12-31 DIAGNOSIS — N1330 Unspecified hydronephrosis: Secondary | ICD-10-CM | POA: Diagnosis not present

## 2016-12-31 DIAGNOSIS — Z87891 Personal history of nicotine dependence: Secondary | ICD-10-CM | POA: Diagnosis not present

## 2016-12-31 DIAGNOSIS — Z933 Colostomy status: Secondary | ICD-10-CM | POA: Insufficient documentation

## 2016-12-31 NOTE — Telephone Encounter (Signed)
Please send order. Diagnosis Colostomy Z93.3 and urostomy Z98.890.  Thanks.

## 2016-12-31 NOTE — Telephone Encounter (Signed)
Patient's wife Hoyle Sauer on [place]) advised that letter is up front ready for pickup.

## 2016-12-31 NOTE — Telephone Encounter (Signed)
Letter done. Please make sure it printed.  I'll sign it when I get to the clinic. He can pick that up and that should work. Thanks.

## 2016-12-31 NOTE — Telephone Encounter (Signed)
Per [place] patient requested a written prescription from Dr. Damita Dunnings.

## 2016-12-31 NOTE — Telephone Encounter (Signed)
Pt left note requesting prescription for Convatec Ileostomy bags with 10 bags to a box; # [id number]; Convatec moldable skin barrier # [id number] and  Convatec drainable pouch # [id number]. Pt last annual 08/18/16.

## 2017-01-07 DIAGNOSIS — I10 Essential (primary) hypertension: Secondary | ICD-10-CM | POA: Diagnosis not present

## 2017-01-07 DIAGNOSIS — Z87891 Personal history of nicotine dependence: Secondary | ICD-10-CM | POA: Diagnosis not present

## 2017-01-07 DIAGNOSIS — N3000 Acute cystitis without hematuria: Secondary | ICD-10-CM | POA: Diagnosis not present

## 2017-01-07 DIAGNOSIS — I482 Chronic atrial fibrillation: Secondary | ICD-10-CM | POA: Diagnosis not present

## 2017-01-07 DIAGNOSIS — K5790 Diverticulosis of intestine, part unspecified, without perforation or abscess without bleeding: Secondary | ICD-10-CM | POA: Diagnosis not present

## 2017-01-07 DIAGNOSIS — Z6820 Body mass index (BMI) 20.0-20.9, adult: Secondary | ICD-10-CM | POA: Diagnosis not present

## 2017-01-07 DIAGNOSIS — Z7901 Long term (current) use of anticoagulants: Secondary | ICD-10-CM | POA: Diagnosis not present

## 2017-01-07 DIAGNOSIS — Z923 Personal history of irradiation: Secondary | ICD-10-CM | POA: Diagnosis not present

## 2017-01-07 DIAGNOSIS — D649 Anemia, unspecified: Secondary | ICD-10-CM | POA: Diagnosis not present

## 2017-01-07 DIAGNOSIS — M489 Spondylopathy, unspecified: Secondary | ICD-10-CM | POA: Diagnosis not present

## 2017-01-07 DIAGNOSIS — Z006 Encounter for examination for normal comparison and control in clinical research program: Secondary | ICD-10-CM | POA: Diagnosis not present

## 2017-01-07 DIAGNOSIS — Z906 Acquired absence of other parts of urinary tract: Secondary | ICD-10-CM | POA: Diagnosis not present

## 2017-01-07 DIAGNOSIS — K4090 Unilateral inguinal hernia, without obstruction or gangrene, not specified as recurrent: Secondary | ICD-10-CM | POA: Diagnosis not present

## 2017-01-07 DIAGNOSIS — N1330 Unspecified hydronephrosis: Secondary | ICD-10-CM | POA: Diagnosis not present

## 2017-01-07 DIAGNOSIS — K8020 Calculus of gallbladder without cholecystitis without obstruction: Secondary | ICD-10-CM | POA: Diagnosis not present

## 2017-01-07 DIAGNOSIS — C679 Malignant neoplasm of bladder, unspecified: Secondary | ICD-10-CM | POA: Diagnosis not present

## 2017-01-07 DIAGNOSIS — Z8546 Personal history of malignant neoplasm of prostate: Secondary | ICD-10-CM | POA: Diagnosis not present

## 2017-01-07 DIAGNOSIS — Z79899 Other long term (current) drug therapy: Secondary | ICD-10-CM | POA: Diagnosis not present

## 2017-01-09 ENCOUNTER — Telehealth: Payer: Self-pay

## 2017-01-09 ENCOUNTER — Telehealth: Payer: Self-pay | Admitting: *Deleted

## 2017-01-09 NOTE — Telephone Encounter (Signed)
Pt left note at front desk; [facility] could not [facility] the prescription Dr Damita Dunnings wrote. Pt found [facility] on line that supplies colostomy supplies. Pt request to send [facility] the info needed so pt can get ostomy supplies. Letter in Dr Josefine Class in box. John with [facility] is going to fax an order form and med records request to [facility]. FYI to Dr Damita Dunnings.

## 2017-01-09 NOTE — Telephone Encounter (Signed)
Noted. Thanks.

## 2017-01-09 NOTE — Telephone Encounter (Signed)
Spoke with Jenny Reichmann with [facility] request status of order forms for ostomy supplies. I advised do not see order form for ostomy supplies. Pt has already picked up rx for Ileostomy supplies. John will ck and if needed will fax another request form.

## 2017-01-09 NOTE — Telephone Encounter (Signed)
Forms for ostomy supplies placed in Dr. Josefine Class In [place].

## 2017-01-10 NOTE — Telephone Encounter (Signed)
I'll work on the hard copy.  Thanks.  

## 2017-01-12 DIAGNOSIS — Z79899 Other long term (current) drug therapy: Secondary | ICD-10-CM | POA: Diagnosis not present

## 2017-01-12 DIAGNOSIS — C7989 Secondary malignant neoplasm of other specified sites: Secondary | ICD-10-CM | POA: Diagnosis not present

## 2017-01-12 DIAGNOSIS — Z8546 Personal history of malignant neoplasm of prostate: Secondary | ICD-10-CM | POA: Diagnosis not present

## 2017-01-12 DIAGNOSIS — Z9221 Personal history of antineoplastic chemotherapy: Secondary | ICD-10-CM | POA: Diagnosis not present

## 2017-01-12 DIAGNOSIS — Z7901 Long term (current) use of anticoagulants: Secondary | ICD-10-CM | POA: Diagnosis not present

## 2017-01-12 DIAGNOSIS — C679 Malignant neoplasm of bladder, unspecified: Secondary | ICD-10-CM | POA: Diagnosis not present

## 2017-01-12 DIAGNOSIS — I482 Chronic atrial fibrillation: Secondary | ICD-10-CM | POA: Diagnosis not present

## 2017-01-12 DIAGNOSIS — Z808 Family history of malignant neoplasm of other organs or systems: Secondary | ICD-10-CM | POA: Diagnosis not present

## 2017-01-12 DIAGNOSIS — Z906 Acquired absence of other parts of urinary tract: Secondary | ICD-10-CM | POA: Diagnosis not present

## 2017-01-12 DIAGNOSIS — I10 Essential (primary) hypertension: Secondary | ICD-10-CM | POA: Diagnosis not present

## 2017-01-12 DIAGNOSIS — Z6820 Body mass index (BMI) 20.0-20.9, adult: Secondary | ICD-10-CM | POA: Diagnosis not present

## 2017-01-12 DIAGNOSIS — Z452 Encounter for adjustment and management of vascular access device: Secondary | ICD-10-CM | POA: Diagnosis not present

## 2017-01-12 DIAGNOSIS — Z5112 Encounter for antineoplastic immunotherapy: Secondary | ICD-10-CM | POA: Diagnosis not present

## 2017-01-12 DIAGNOSIS — R918 Other nonspecific abnormal finding of lung field: Secondary | ICD-10-CM | POA: Diagnosis not present

## 2017-01-12 DIAGNOSIS — Z923 Personal history of irradiation: Secondary | ICD-10-CM | POA: Diagnosis not present

## 2017-01-12 DIAGNOSIS — Z5111 Encounter for antineoplastic chemotherapy: Secondary | ICD-10-CM | POA: Diagnosis not present

## 2017-01-12 DIAGNOSIS — Z800 Family history of malignant neoplasm of digestive organs: Secondary | ICD-10-CM | POA: Diagnosis not present

## 2017-01-12 DIAGNOSIS — D414 Neoplasm of uncertain behavior of bladder: Secondary | ICD-10-CM | POA: Diagnosis not present

## 2017-01-12 DIAGNOSIS — Z006 Encounter for examination for normal comparison and control in clinical research program: Secondary | ICD-10-CM | POA: Diagnosis not present

## 2017-01-12 DIAGNOSIS — C775 Secondary and unspecified malignant neoplasm of intrapelvic lymph nodes: Secondary | ICD-10-CM | POA: Diagnosis not present

## 2017-01-12 DIAGNOSIS — D649 Anemia, unspecified: Secondary | ICD-10-CM | POA: Diagnosis not present

## 2017-01-12 DIAGNOSIS — Z87891 Personal history of nicotine dependence: Secondary | ICD-10-CM | POA: Diagnosis not present

## 2017-01-12 DIAGNOSIS — R634 Abnormal weight loss: Secondary | ICD-10-CM | POA: Diagnosis not present

## 2017-01-12 NOTE — Telephone Encounter (Signed)
Forms faxed and sent for scanning.

## 2017-01-12 NOTE — Telephone Encounter (Signed)
Forms faxed

## 2017-01-18 ENCOUNTER — Other Ambulatory Visit: Payer: Self-pay | Admitting: Internal Medicine

## 2017-01-21 DIAGNOSIS — C679 Malignant neoplasm of bladder, unspecified: Secondary | ICD-10-CM | POA: Diagnosis not present

## 2017-01-21 DIAGNOSIS — Z006 Encounter for examination for normal comparison and control in clinical research program: Secondary | ICD-10-CM | POA: Diagnosis not present

## 2017-01-21 DIAGNOSIS — Z79899 Other long term (current) drug therapy: Secondary | ICD-10-CM | POA: Diagnosis not present

## 2017-01-21 DIAGNOSIS — Z7689 Persons encountering health services in other specified circumstances: Secondary | ICD-10-CM | POA: Diagnosis not present

## 2017-01-27 DIAGNOSIS — H2513 Age-related nuclear cataract, bilateral: Secondary | ICD-10-CM | POA: Diagnosis not present

## 2017-01-27 DIAGNOSIS — H16213 Exposure keratoconjunctivitis, bilateral: Secondary | ICD-10-CM | POA: Diagnosis not present

## 2017-01-27 DIAGNOSIS — H04123 Dry eye syndrome of bilateral lacrimal glands: Secondary | ICD-10-CM | POA: Diagnosis not present

## 2017-01-27 DIAGNOSIS — H524 Presbyopia: Secondary | ICD-10-CM | POA: Diagnosis not present

## 2017-01-30 ENCOUNTER — Other Ambulatory Visit: Payer: Self-pay | Admitting: Cardiovascular Disease

## 2017-01-30 ENCOUNTER — Other Ambulatory Visit: Payer: Self-pay | Admitting: Family Medicine

## 2017-01-30 DIAGNOSIS — I480 Paroxysmal atrial fibrillation: Secondary | ICD-10-CM

## 2017-02-02 NOTE — Telephone Encounter (Signed)
Sent. Thanks.   

## 2017-02-02 NOTE — Telephone Encounter (Signed)
Electronic refill request. Last office visit:   08/18/16 Last Filled:    90 tablet 0 10/06/2016  Please advise.

## 2017-02-04 DIAGNOSIS — Z87440 Personal history of urinary (tract) infections: Secondary | ICD-10-CM | POA: Diagnosis not present

## 2017-02-04 DIAGNOSIS — N393 Stress incontinence (female) (male): Secondary | ICD-10-CM | POA: Diagnosis not present

## 2017-02-04 DIAGNOSIS — N320 Bladder-neck obstruction: Secondary | ICD-10-CM | POA: Diagnosis not present

## 2017-02-04 DIAGNOSIS — R338 Other retention of urine: Secondary | ICD-10-CM | POA: Diagnosis not present

## 2017-02-04 DIAGNOSIS — Z5112 Encounter for antineoplastic immunotherapy: Secondary | ICD-10-CM | POA: Diagnosis not present

## 2017-02-04 DIAGNOSIS — Z6820 Body mass index (BMI) 20.0-20.9, adult: Secondary | ICD-10-CM | POA: Diagnosis not present

## 2017-02-04 DIAGNOSIS — Z5111 Encounter for antineoplastic chemotherapy: Secondary | ICD-10-CM | POA: Diagnosis not present

## 2017-02-04 DIAGNOSIS — Z79899 Other long term (current) drug therapy: Secondary | ICD-10-CM | POA: Diagnosis not present

## 2017-02-04 DIAGNOSIS — C775 Secondary and unspecified malignant neoplasm of intrapelvic lymph nodes: Secondary | ICD-10-CM | POA: Diagnosis not present

## 2017-02-04 DIAGNOSIS — Z87891 Personal history of nicotine dependence: Secondary | ICD-10-CM | POA: Diagnosis not present

## 2017-02-04 DIAGNOSIS — I10 Essential (primary) hypertension: Secondary | ICD-10-CM | POA: Diagnosis not present

## 2017-02-04 DIAGNOSIS — Z923 Personal history of irradiation: Secondary | ICD-10-CM | POA: Diagnosis not present

## 2017-02-04 DIAGNOSIS — C679 Malignant neoplasm of bladder, unspecified: Secondary | ICD-10-CM | POA: Diagnosis not present

## 2017-02-04 DIAGNOSIS — Z906 Acquired absence of other parts of urinary tract: Secondary | ICD-10-CM | POA: Diagnosis not present

## 2017-02-04 DIAGNOSIS — Z006 Encounter for examination for normal comparison and control in clinical research program: Secondary | ICD-10-CM | POA: Diagnosis not present

## 2017-02-04 DIAGNOSIS — Z7901 Long term (current) use of anticoagulants: Secondary | ICD-10-CM | POA: Diagnosis not present

## 2017-02-04 DIAGNOSIS — Z8546 Personal history of malignant neoplasm of prostate: Secondary | ICD-10-CM | POA: Diagnosis not present

## 2017-02-04 DIAGNOSIS — I482 Chronic atrial fibrillation: Secondary | ICD-10-CM | POA: Diagnosis not present

## 2017-02-04 DIAGNOSIS — C7989 Secondary malignant neoplasm of other specified sites: Secondary | ICD-10-CM | POA: Diagnosis not present

## 2017-02-04 DIAGNOSIS — R339 Retention of urine, unspecified: Secondary | ICD-10-CM | POA: Diagnosis not present

## 2017-02-11 DIAGNOSIS — Z5111 Encounter for antineoplastic chemotherapy: Secondary | ICD-10-CM | POA: Diagnosis not present

## 2017-02-11 DIAGNOSIS — Z79899 Other long term (current) drug therapy: Secondary | ICD-10-CM | POA: Diagnosis not present

## 2017-02-11 DIAGNOSIS — C679 Malignant neoplasm of bladder, unspecified: Secondary | ICD-10-CM | POA: Diagnosis not present

## 2017-02-11 DIAGNOSIS — Z7689 Persons encountering health services in other specified circumstances: Secondary | ICD-10-CM | POA: Diagnosis not present

## 2017-02-11 DIAGNOSIS — C61 Malignant neoplasm of prostate: Secondary | ICD-10-CM | POA: Diagnosis not present

## 2017-02-11 DIAGNOSIS — Z006 Encounter for examination for normal comparison and control in clinical research program: Secondary | ICD-10-CM | POA: Diagnosis not present

## 2017-02-19 ENCOUNTER — Other Ambulatory Visit: Payer: Self-pay | Admitting: Family Medicine

## 2017-02-25 DIAGNOSIS — Z7901 Long term (current) use of anticoagulants: Secondary | ICD-10-CM | POA: Diagnosis not present

## 2017-02-25 DIAGNOSIS — Z006 Encounter for examination for normal comparison and control in clinical research program: Secondary | ICD-10-CM | POA: Diagnosis not present

## 2017-02-25 DIAGNOSIS — D649 Anemia, unspecified: Secondary | ICD-10-CM | POA: Diagnosis not present

## 2017-02-25 DIAGNOSIS — C7989 Secondary malignant neoplasm of other specified sites: Secondary | ICD-10-CM | POA: Diagnosis not present

## 2017-02-25 DIAGNOSIS — Z8551 Personal history of malignant neoplasm of bladder: Secondary | ICD-10-CM | POA: Diagnosis not present

## 2017-02-25 DIAGNOSIS — Z87891 Personal history of nicotine dependence: Secondary | ICD-10-CM | POA: Diagnosis not present

## 2017-02-25 DIAGNOSIS — Z79899 Other long term (current) drug therapy: Secondary | ICD-10-CM | POA: Diagnosis not present

## 2017-02-25 DIAGNOSIS — Z8546 Personal history of malignant neoplasm of prostate: Secondary | ICD-10-CM | POA: Diagnosis not present

## 2017-02-25 DIAGNOSIS — I10 Essential (primary) hypertension: Secondary | ICD-10-CM | POA: Diagnosis not present

## 2017-02-25 DIAGNOSIS — I482 Chronic atrial fibrillation: Secondary | ICD-10-CM | POA: Diagnosis not present

## 2017-02-25 DIAGNOSIS — D630 Anemia in neoplastic disease: Secondary | ICD-10-CM | POA: Diagnosis not present

## 2017-02-27 DIAGNOSIS — Z87891 Personal history of nicotine dependence: Secondary | ICD-10-CM | POA: Diagnosis not present

## 2017-02-27 DIAGNOSIS — I480 Paroxysmal atrial fibrillation: Secondary | ICD-10-CM | POA: Diagnosis not present

## 2017-02-27 DIAGNOSIS — Z006 Encounter for examination for normal comparison and control in clinical research program: Secondary | ICD-10-CM | POA: Diagnosis not present

## 2017-02-27 DIAGNOSIS — R001 Bradycardia, unspecified: Secondary | ICD-10-CM | POA: Diagnosis not present

## 2017-02-27 DIAGNOSIS — I951 Orthostatic hypotension: Secondary | ICD-10-CM | POA: Diagnosis not present

## 2017-02-27 DIAGNOSIS — Z79899 Other long term (current) drug therapy: Secondary | ICD-10-CM | POA: Diagnosis not present

## 2017-02-27 DIAGNOSIS — Z6821 Body mass index (BMI) 21.0-21.9, adult: Secondary | ICD-10-CM | POA: Diagnosis not present

## 2017-02-27 DIAGNOSIS — Z7901 Long term (current) use of anticoagulants: Secondary | ICD-10-CM | POA: Diagnosis not present

## 2017-02-27 DIAGNOSIS — I4891 Unspecified atrial fibrillation: Secondary | ICD-10-CM | POA: Diagnosis not present

## 2017-02-27 DIAGNOSIS — R748 Abnormal levels of other serum enzymes: Secondary | ICD-10-CM | POA: Diagnosis not present

## 2017-03-04 DIAGNOSIS — Z79899 Other long term (current) drug therapy: Secondary | ICD-10-CM | POA: Diagnosis not present

## 2017-03-04 DIAGNOSIS — Z006 Encounter for examination for normal comparison and control in clinical research program: Secondary | ICD-10-CM | POA: Diagnosis not present

## 2017-03-04 DIAGNOSIS — Z5112 Encounter for antineoplastic immunotherapy: Secondary | ICD-10-CM | POA: Diagnosis not present

## 2017-03-04 DIAGNOSIS — C7989 Secondary malignant neoplasm of other specified sites: Secondary | ICD-10-CM | POA: Diagnosis not present

## 2017-03-04 DIAGNOSIS — Z6821 Body mass index (BMI) 21.0-21.9, adult: Secondary | ICD-10-CM | POA: Diagnosis not present

## 2017-03-04 DIAGNOSIS — C679 Malignant neoplasm of bladder, unspecified: Secondary | ICD-10-CM | POA: Diagnosis not present

## 2017-03-05 ENCOUNTER — Telehealth: Payer: Self-pay | Admitting: *Deleted

## 2017-03-05 NOTE — Telephone Encounter (Signed)
Form received by fax for ostomy supplies with supporting documentation.  Form placed in Dr. Josefine Class In [place].

## 2017-03-06 NOTE — Telephone Encounter (Signed)
I'll work on the hard copy.  Thanks.  

## 2017-03-08 ENCOUNTER — Encounter: Payer: Self-pay | Admitting: Family Medicine

## 2017-03-08 DIAGNOSIS — Z436 Encounter for attention to other artificial openings of urinary tract: Secondary | ICD-10-CM | POA: Insufficient documentation

## 2017-03-09 NOTE — Telephone Encounter (Signed)
Form faxed and scanned.

## 2017-03-25 DIAGNOSIS — Z7901 Long term (current) use of anticoagulants: Secondary | ICD-10-CM | POA: Diagnosis not present

## 2017-03-25 DIAGNOSIS — I10 Essential (primary) hypertension: Secondary | ICD-10-CM | POA: Diagnosis not present

## 2017-03-25 DIAGNOSIS — Z006 Encounter for examination for normal comparison and control in clinical research program: Secondary | ICD-10-CM | POA: Diagnosis not present

## 2017-03-25 DIAGNOSIS — C679 Malignant neoplasm of bladder, unspecified: Secondary | ICD-10-CM | POA: Diagnosis not present

## 2017-03-25 DIAGNOSIS — Z5111 Encounter for antineoplastic chemotherapy: Secondary | ICD-10-CM | POA: Diagnosis not present

## 2017-03-25 DIAGNOSIS — N1330 Unspecified hydronephrosis: Secondary | ICD-10-CM | POA: Diagnosis not present

## 2017-03-25 DIAGNOSIS — I4891 Unspecified atrial fibrillation: Secondary | ICD-10-CM | POA: Diagnosis not present

## 2017-03-25 DIAGNOSIS — Z87891 Personal history of nicotine dependence: Secondary | ICD-10-CM | POA: Diagnosis not present

## 2017-03-25 DIAGNOSIS — Z6821 Body mass index (BMI) 21.0-21.9, adult: Secondary | ICD-10-CM | POA: Diagnosis not present

## 2017-03-25 DIAGNOSIS — C7989 Secondary malignant neoplasm of other specified sites: Secondary | ICD-10-CM | POA: Diagnosis not present

## 2017-03-25 DIAGNOSIS — Z79899 Other long term (current) drug therapy: Secondary | ICD-10-CM | POA: Diagnosis not present

## 2017-03-26 ENCOUNTER — Other Ambulatory Visit: Payer: Self-pay | Admitting: *Deleted

## 2017-03-26 NOTE — Telephone Encounter (Signed)
Faxed refill request. Ferrous Sulfate Last office visit:   08/18/16 Last Filled:    30 tablet 3 11/28/2016  Does patient need to continue this medication?

## 2017-03-27 MED ORDER — FERROUS SULFATE 325 (65 FE) MG PO TABS: 325.0000 mg | ORAL_TABLET | Freq: Every day | ORAL | 3 refills | Status: DC

## 2017-03-27 NOTE — Telephone Encounter (Signed)
Marked to continue per [facility] records.  He can get OTC if he doesn't want rx.  Sent.  Thanks.

## 2017-03-30 IMAGING — CT CT ABD-PELV W/ CM
2 of 5 series · 15 of 46 positions shown, 17 images · IV contrast (APPLIED)
Comparison: CT 03/22/2016

CLINICAL DATA: Abdominal pain with nausea and vomiting. History of
bladder and prostate cancer. Previous cystectomy and colostomy.

EXAM:
CT ABDOMEN AND PELVIS WITH CONTRAST
TECHNIQUE: Multidetector CT imaging of the abdomen and pelvis was performed
using the standard protocol following bolus administration of
intravenous contrast.
CONTRAST:  100mL 6O2Q8P-FSS IOPAMIDOL (6O2Q8P-FSS) INJECTION 61%

[Series 2: axial st · axial · 0.71mm/px · z∈[-1124,-719]mm · 12 of 91 slices shown, 14 images]
[im 5/91  soft-tissue]
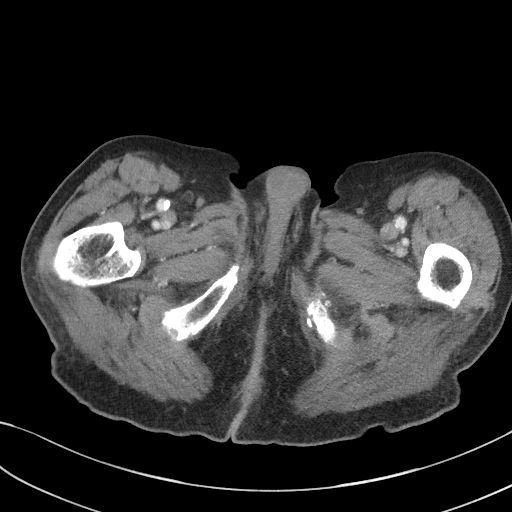
[im 5/91  bone]
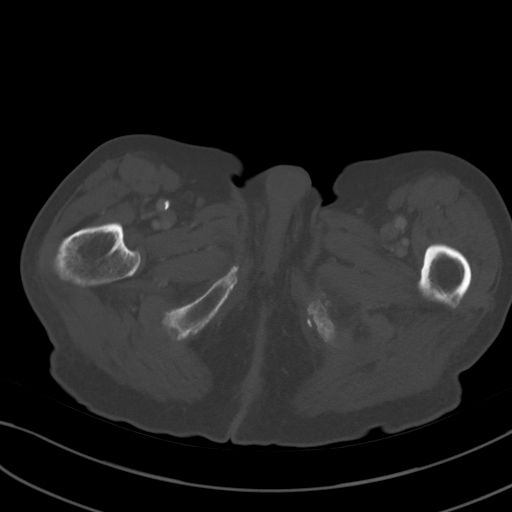
[im 14/91  soft-tissue]
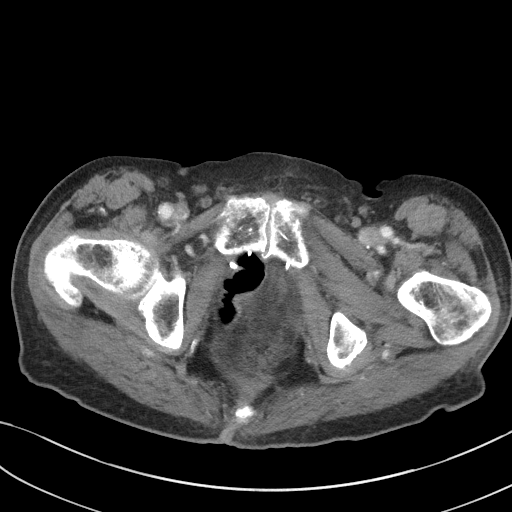
[im 19/91  soft-tissue]
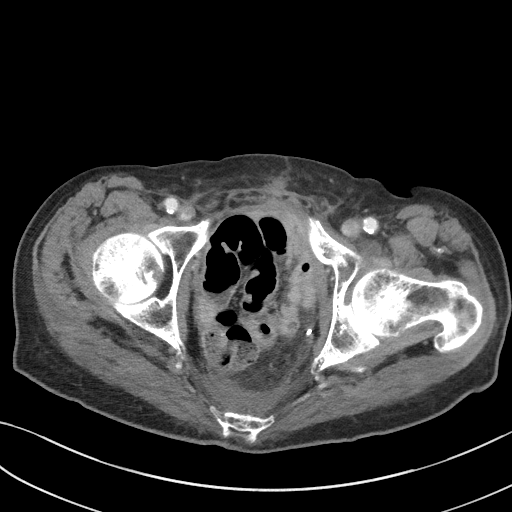
[im 28/91  soft-tissue]
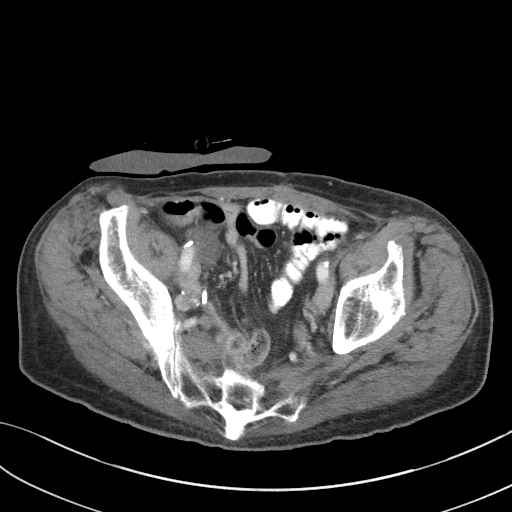
[im 37/91  soft-tissue]
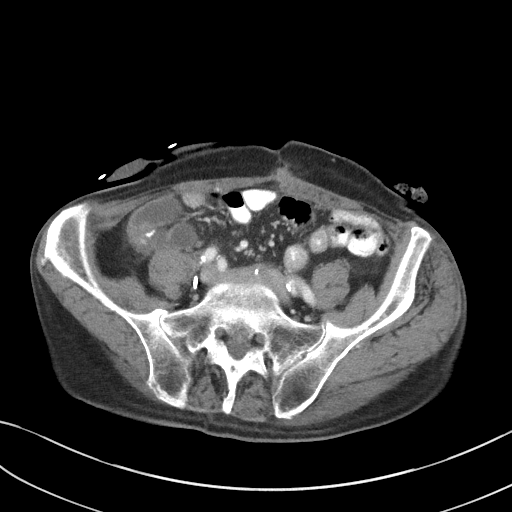
[im 41/91  soft-tissue]
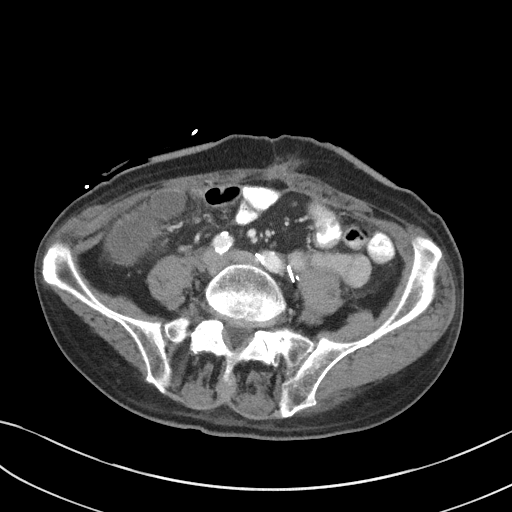
[im 50/91  soft-tissue]
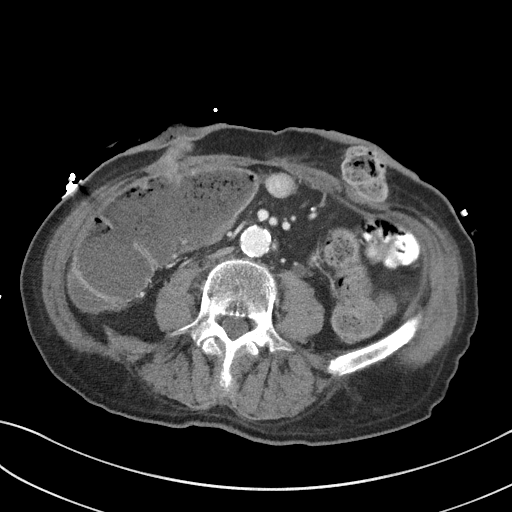
[im 55/91  soft-tissue]
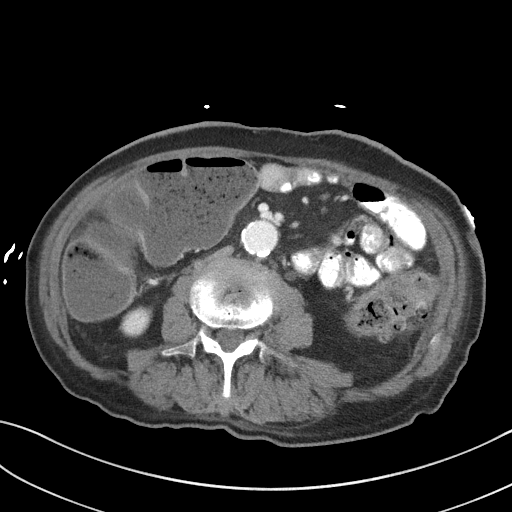
[im 64/91  soft-tissue]
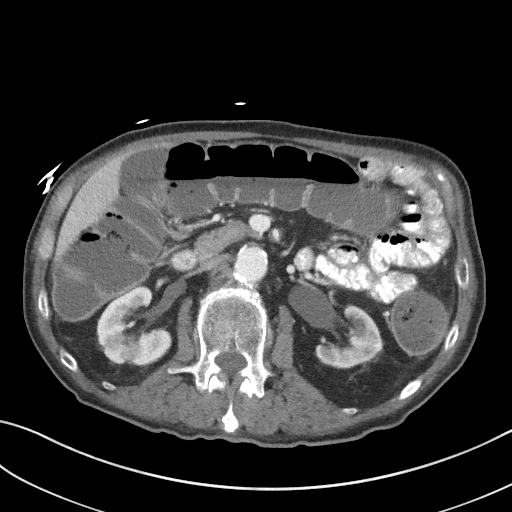
[im 64/91  bone]
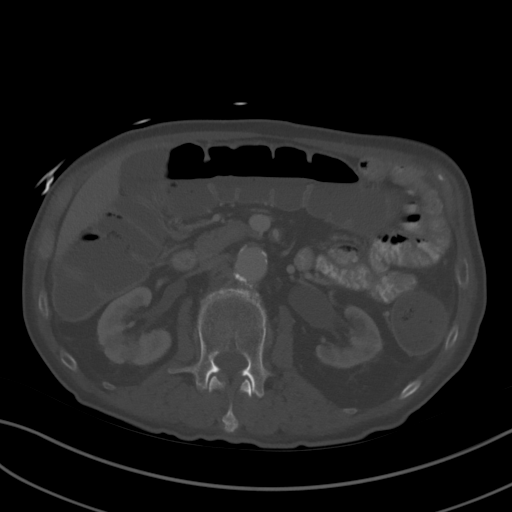
[im 73/91  soft-tissue]
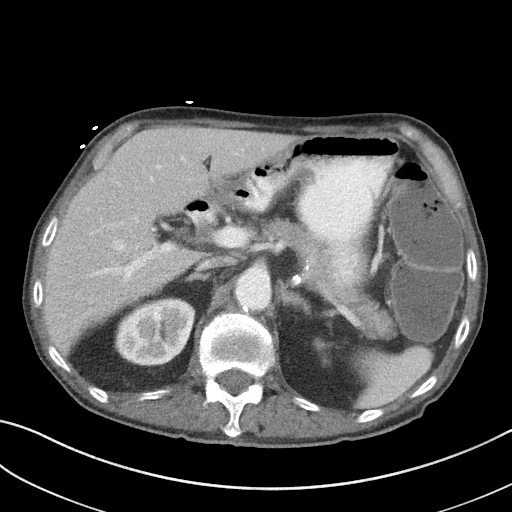
[im 77/91  soft-tissue]
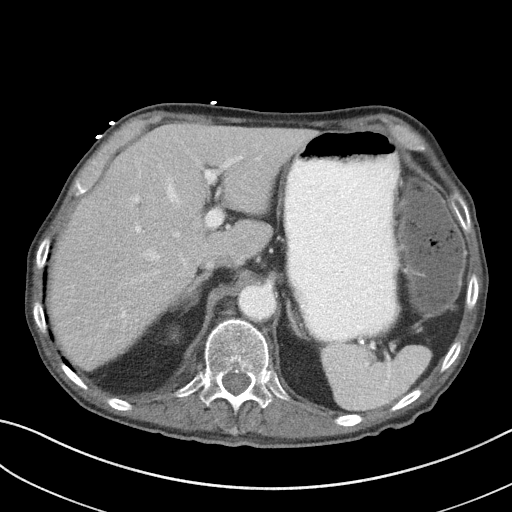
[im 86/91  soft-tissue]
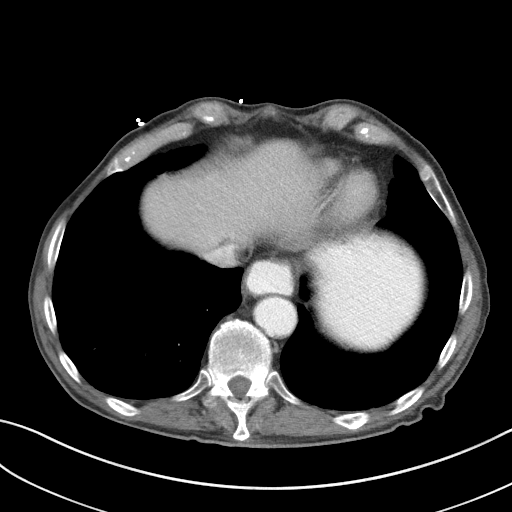

[Series 5: coronal st · coronal · 0.68mm/px · 3 of 81 slices shown]
[im 27/81  soft-tissue]
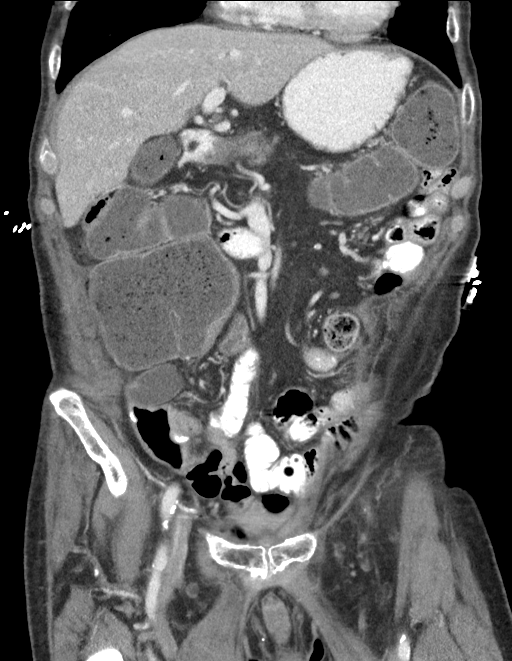
[im 36/81  soft-tissue]
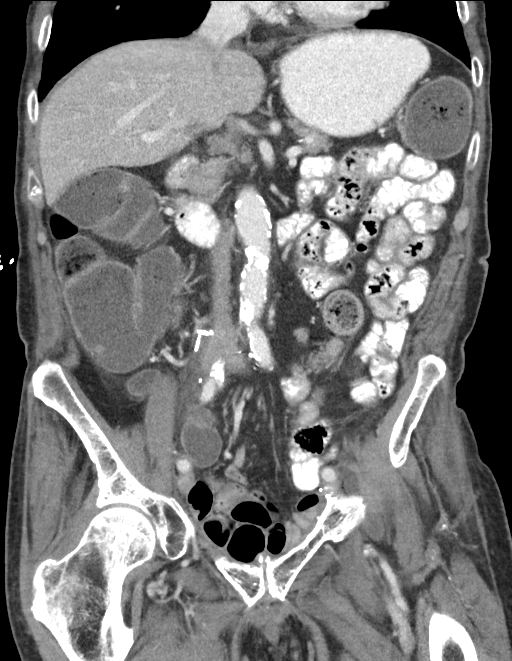
[im 45/81  soft-tissue]
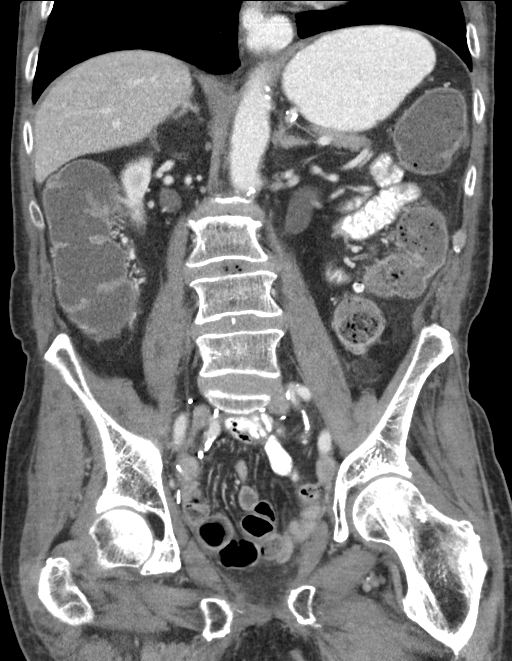

[15 of 46 positions shown; findings below may reference images not displayed]

FINDINGS: Lower chest: Mild atelectasis at the lung bases. No significant
pleural or pericardial effusion.

Hepatobiliary: The liver is normal in density without focal
abnormality. Nitrogen containing gallstones are again noted. There
is no evidence of gallbladder wall thickening, surrounding
inflammation or biliary dilatation.

Pancreas: Unremarkable. No pancreatic ductal dilatation or
surrounding inflammatory changes.

Spleen: Normal in size without focal abnormality.

Adrenals/Urinary Tract: Both adrenal glands appear normal. Status
post cystectomy and presumed interval right lower quadrant ileal
conduit. The left kidney is atrophied with cortical thinning and
decreased excretion. Mild dilatation of both collecting systems is
improved compared with the prior study. 1.7 cm low-density lesion in
the upper pole the right kidney is subjectively a cyst. There is an
exophytic 1.5 cm lesion in the lower pole the right kidney which
measures higher than water density, although is similar in size and
density to the previous study, likely a small hemorrhagic cyst. No
definite enhancing renal mass. No evidence of urinary tract
calculus.

Stomach/Bowel: There is a small hiatal hernia. The stomach is mildly
distended. Suggested wall thickening in the distal stomach on the
portal phase images is less apparent on the delayed images, probably
due to a contraction. No significant small bowel distension or wall
thickening. There are interval postsurgical changes in the distal
small bowel. The colon is fluid-filled and mildly distended. Sigmoid
colostomy noted, containing stool. The rectum has been resected. No
extraluminal fluid collections are seen.

Vascular/Lymphatic: There are mildly prominent retroperitoneal lymph
nodes on the left near the aortic bifurcation (image 47). No
pathologically enlarged abdominal pelvic lymph nodes are seen. There
is aortic and branch vessel atherosclerosis. No evidence of large
vessel occlusion.

Reproductive: Status post cystoprostatectomy. No evidence of pelvic
mass. There is mild soft tissue stranding and a small amount of
fluid in the presacral space.

Other: Interval postsurgical changes within the anterior abdominal
wall. No generalized ascites or focal extraluminal fluid collection.

Musculoskeletal: Interval development of an L1 compression fracture
resulting in greater than 50% loss of vertebral body height. There
is no significant osseous retropulsion. No underlying lytic or
blastic lesions are seen.
IMPRESSION: 1. Interval abdominal surgery including abdominal perineal
resection, sigmoid colostomy and ileal conduit formation.
2. The colon is fluid-filled and mildly distended, but without
definite signs of obstruction.
3. Improved collecting system dilatation bilaterally. Chronic renal
cortical thinning on the left with decreased contrast excretion.
Nonsuspicious right renal lesions.
4. Cholelithiasis.
5. Interval L1 compression fracture.

## 2017-04-15 DIAGNOSIS — I10 Essential (primary) hypertension: Secondary | ICD-10-CM | POA: Diagnosis not present

## 2017-04-15 DIAGNOSIS — Z5111 Encounter for antineoplastic chemotherapy: Secondary | ICD-10-CM | POA: Diagnosis not present

## 2017-04-15 DIAGNOSIS — Z87891 Personal history of nicotine dependence: Secondary | ICD-10-CM | POA: Diagnosis not present

## 2017-04-15 DIAGNOSIS — C679 Malignant neoplasm of bladder, unspecified: Secondary | ICD-10-CM | POA: Diagnosis not present

## 2017-04-15 DIAGNOSIS — Z006 Encounter for examination for normal comparison and control in clinical research program: Secondary | ICD-10-CM | POA: Diagnosis not present

## 2017-04-15 DIAGNOSIS — Z79899 Other long term (current) drug therapy: Secondary | ICD-10-CM | POA: Diagnosis not present

## 2017-04-15 DIAGNOSIS — Z6821 Body mass index (BMI) 21.0-21.9, adult: Secondary | ICD-10-CM | POA: Diagnosis not present

## 2017-04-15 DIAGNOSIS — I4891 Unspecified atrial fibrillation: Secondary | ICD-10-CM | POA: Diagnosis not present

## 2017-04-15 DIAGNOSIS — K4090 Unilateral inguinal hernia, without obstruction or gangrene, not specified as recurrent: Secondary | ICD-10-CM | POA: Diagnosis not present

## 2017-04-15 DIAGNOSIS — Z7901 Long term (current) use of anticoagulants: Secondary | ICD-10-CM | POA: Diagnosis not present

## 2017-04-15 DIAGNOSIS — C61 Malignant neoplasm of prostate: Secondary | ICD-10-CM | POA: Diagnosis not present

## 2017-04-29 DIAGNOSIS — K8020 Calculus of gallbladder without cholecystitis without obstruction: Secondary | ICD-10-CM | POA: Diagnosis not present

## 2017-04-29 DIAGNOSIS — C679 Malignant neoplasm of bladder, unspecified: Secondary | ICD-10-CM | POA: Diagnosis not present

## 2017-04-29 DIAGNOSIS — R918 Other nonspecific abnormal finding of lung field: Secondary | ICD-10-CM | POA: Diagnosis not present

## 2017-05-06 DIAGNOSIS — Z8546 Personal history of malignant neoplasm of prostate: Secondary | ICD-10-CM | POA: Diagnosis not present

## 2017-05-06 DIAGNOSIS — Z6822 Body mass index (BMI) 22.0-22.9, adult: Secondary | ICD-10-CM | POA: Diagnosis not present

## 2017-05-06 DIAGNOSIS — Z87891 Personal history of nicotine dependence: Secondary | ICD-10-CM | POA: Diagnosis not present

## 2017-05-06 DIAGNOSIS — Z006 Encounter for examination for normal comparison and control in clinical research program: Secondary | ICD-10-CM | POA: Diagnosis not present

## 2017-05-06 DIAGNOSIS — I10 Essential (primary) hypertension: Secondary | ICD-10-CM | POA: Diagnosis not present

## 2017-05-06 DIAGNOSIS — C679 Malignant neoplasm of bladder, unspecified: Secondary | ICD-10-CM | POA: Diagnosis not present

## 2017-05-06 DIAGNOSIS — N1330 Unspecified hydronephrosis: Secondary | ICD-10-CM | POA: Diagnosis not present

## 2017-05-06 DIAGNOSIS — I4891 Unspecified atrial fibrillation: Secondary | ICD-10-CM | POA: Diagnosis not present

## 2017-05-08 DIAGNOSIS — Z79899 Other long term (current) drug therapy: Secondary | ICD-10-CM | POA: Diagnosis not present

## 2017-05-08 DIAGNOSIS — C679 Malignant neoplasm of bladder, unspecified: Secondary | ICD-10-CM | POA: Diagnosis not present

## 2017-05-08 DIAGNOSIS — Z006 Encounter for examination for normal comparison and control in clinical research program: Secondary | ICD-10-CM | POA: Diagnosis not present

## 2017-05-08 DIAGNOSIS — Z5112 Encounter for antineoplastic immunotherapy: Secondary | ICD-10-CM | POA: Diagnosis not present

## 2017-05-08 DIAGNOSIS — C7989 Secondary malignant neoplasm of other specified sites: Secondary | ICD-10-CM | POA: Diagnosis not present

## 2017-05-27 DIAGNOSIS — N1330 Unspecified hydronephrosis: Secondary | ICD-10-CM | POA: Diagnosis not present

## 2017-05-27 DIAGNOSIS — I4891 Unspecified atrial fibrillation: Secondary | ICD-10-CM | POA: Diagnosis not present

## 2017-05-27 DIAGNOSIS — R634 Abnormal weight loss: Secondary | ICD-10-CM | POA: Diagnosis not present

## 2017-05-27 DIAGNOSIS — Z6822 Body mass index (BMI) 22.0-22.9, adult: Secondary | ICD-10-CM | POA: Diagnosis not present

## 2017-05-27 DIAGNOSIS — Z006 Encounter for examination for normal comparison and control in clinical research program: Secondary | ICD-10-CM | POA: Diagnosis not present

## 2017-05-27 DIAGNOSIS — Z87891 Personal history of nicotine dependence: Secondary | ICD-10-CM | POA: Diagnosis not present

## 2017-05-27 DIAGNOSIS — I10 Essential (primary) hypertension: Secondary | ICD-10-CM | POA: Diagnosis not present

## 2017-05-27 DIAGNOSIS — Z79899 Other long term (current) drug therapy: Secondary | ICD-10-CM | POA: Diagnosis not present

## 2017-05-27 DIAGNOSIS — C679 Malignant neoplasm of bladder, unspecified: Secondary | ICD-10-CM | POA: Diagnosis not present

## 2017-05-27 DIAGNOSIS — Z7901 Long term (current) use of anticoagulants: Secondary | ICD-10-CM | POA: Diagnosis not present

## 2017-06-02 ENCOUNTER — Ambulatory Visit (INDEPENDENT_AMBULATORY_CARE_PROVIDER_SITE_OTHER): Payer: Medicare Other | Admitting: *Deleted

## 2017-06-02 DIAGNOSIS — Z23 Encounter for immunization: Secondary | ICD-10-CM | POA: Diagnosis not present

## 2017-06-11 ENCOUNTER — Telehealth: Payer: Self-pay

## 2017-06-11 NOTE — Telephone Encounter (Signed)
[facility] left v/m requesting status of faxed order for ostomy supplies.Please advise.

## 2017-06-13 NOTE — Telephone Encounter (Signed)
I have done all of the orders that were faxed.  I don't have an outstanding order. Please have them resend it.  Thanks.

## 2017-06-15 NOTE — Telephone Encounter (Signed)
Form done. Thanks. 

## 2017-06-15 NOTE — Telephone Encounter (Signed)
Placed in your In Box on Friday.

## 2017-06-16 NOTE — Telephone Encounter (Signed)
Faxed and scanned

## 2017-06-17 DIAGNOSIS — C779 Secondary and unspecified malignant neoplasm of lymph node, unspecified: Secondary | ICD-10-CM | POA: Diagnosis not present

## 2017-06-17 DIAGNOSIS — Z006 Encounter for examination for normal comparison and control in clinical research program: Secondary | ICD-10-CM | POA: Diagnosis not present

## 2017-06-17 DIAGNOSIS — Z7901 Long term (current) use of anticoagulants: Secondary | ICD-10-CM | POA: Diagnosis not present

## 2017-06-17 DIAGNOSIS — Z923 Personal history of irradiation: Secondary | ICD-10-CM | POA: Diagnosis not present

## 2017-06-17 DIAGNOSIS — I482 Chronic atrial fibrillation: Secondary | ICD-10-CM | POA: Diagnosis not present

## 2017-06-17 DIAGNOSIS — N1330 Unspecified hydronephrosis: Secondary | ICD-10-CM | POA: Diagnosis not present

## 2017-06-17 DIAGNOSIS — Z808 Family history of malignant neoplasm of other organs or systems: Secondary | ICD-10-CM | POA: Diagnosis not present

## 2017-06-17 DIAGNOSIS — Z5112 Encounter for antineoplastic immunotherapy: Secondary | ICD-10-CM | POA: Diagnosis not present

## 2017-06-17 DIAGNOSIS — Z6822 Body mass index (BMI) 22.0-22.9, adult: Secondary | ICD-10-CM | POA: Diagnosis not present

## 2017-06-17 DIAGNOSIS — C679 Malignant neoplasm of bladder, unspecified: Secondary | ICD-10-CM | POA: Diagnosis not present

## 2017-06-17 DIAGNOSIS — Z906 Acquired absence of other parts of urinary tract: Secondary | ICD-10-CM | POA: Diagnosis not present

## 2017-06-17 DIAGNOSIS — Z9221 Personal history of antineoplastic chemotherapy: Secondary | ICD-10-CM | POA: Diagnosis not present

## 2017-06-17 DIAGNOSIS — Z79899 Other long term (current) drug therapy: Secondary | ICD-10-CM | POA: Diagnosis not present

## 2017-06-17 DIAGNOSIS — R918 Other nonspecific abnormal finding of lung field: Secondary | ICD-10-CM | POA: Diagnosis not present

## 2017-06-17 DIAGNOSIS — C785 Secondary malignant neoplasm of large intestine and rectum: Secondary | ICD-10-CM | POA: Diagnosis not present

## 2017-06-17 DIAGNOSIS — M8458XD Pathological fracture in neoplastic disease, other specified site, subsequent encounter for fracture with routine healing: Secondary | ICD-10-CM | POA: Diagnosis not present

## 2017-06-17 DIAGNOSIS — K8020 Calculus of gallbladder without cholecystitis without obstruction: Secondary | ICD-10-CM | POA: Diagnosis not present

## 2017-06-17 DIAGNOSIS — Z800 Family history of malignant neoplasm of digestive organs: Secondary | ICD-10-CM | POA: Diagnosis not present

## 2017-06-17 DIAGNOSIS — I10 Essential (primary) hypertension: Secondary | ICD-10-CM | POA: Diagnosis not present

## 2017-06-17 DIAGNOSIS — K4090 Unilateral inguinal hernia, without obstruction or gangrene, not specified as recurrent: Secondary | ICD-10-CM | POA: Diagnosis not present

## 2017-06-17 DIAGNOSIS — K5730 Diverticulosis of large intestine without perforation or abscess without bleeding: Secondary | ICD-10-CM | POA: Diagnosis not present

## 2017-06-17 DIAGNOSIS — Z8546 Personal history of malignant neoplasm of prostate: Secondary | ICD-10-CM | POA: Diagnosis not present

## 2017-06-17 DIAGNOSIS — Z87891 Personal history of nicotine dependence: Secondary | ICD-10-CM | POA: Diagnosis not present

## 2017-06-21 ENCOUNTER — Other Ambulatory Visit: Payer: Self-pay | Admitting: Family Medicine

## 2017-06-28 ENCOUNTER — Other Ambulatory Visit: Payer: Self-pay | Admitting: Family Medicine

## 2017-06-29 NOTE — Telephone Encounter (Signed)
Electronic refill request. Ferrous Sulfate Last office visit:   08/18/16 Last Filled:    30 tablet 3 03/27/2017  Does patient continue this medication? Please advise.

## 2017-07-01 NOTE — Telephone Encounter (Signed)
Would continue, assuming he has been on it in the interval.  rx sent.  Thanks.

## 2017-07-08 DIAGNOSIS — Z7901 Long term (current) use of anticoagulants: Secondary | ICD-10-CM | POA: Diagnosis not present

## 2017-07-08 DIAGNOSIS — I10 Essential (primary) hypertension: Secondary | ICD-10-CM | POA: Diagnosis not present

## 2017-07-08 DIAGNOSIS — C679 Malignant neoplasm of bladder, unspecified: Secondary | ICD-10-CM | POA: Diagnosis not present

## 2017-07-08 DIAGNOSIS — Z87891 Personal history of nicotine dependence: Secondary | ICD-10-CM | POA: Diagnosis not present

## 2017-07-08 DIAGNOSIS — Z006 Encounter for examination for normal comparison and control in clinical research program: Secondary | ICD-10-CM | POA: Diagnosis not present

## 2017-07-08 DIAGNOSIS — Z8546 Personal history of malignant neoplasm of prostate: Secondary | ICD-10-CM | POA: Diagnosis not present

## 2017-07-08 DIAGNOSIS — N1330 Unspecified hydronephrosis: Secondary | ICD-10-CM | POA: Diagnosis not present

## 2017-07-08 DIAGNOSIS — C3490 Malignant neoplasm of unspecified part of unspecified bronchus or lung: Secondary | ICD-10-CM | POA: Diagnosis not present

## 2017-07-08 DIAGNOSIS — I4891 Unspecified atrial fibrillation: Secondary | ICD-10-CM | POA: Diagnosis not present

## 2017-07-08 DIAGNOSIS — Z5111 Encounter for antineoplastic chemotherapy: Secondary | ICD-10-CM | POA: Diagnosis not present

## 2017-07-29 DIAGNOSIS — C7989 Secondary malignant neoplasm of other specified sites: Secondary | ICD-10-CM | POA: Diagnosis not present

## 2017-07-29 DIAGNOSIS — Z79899 Other long term (current) drug therapy: Secondary | ICD-10-CM | POA: Diagnosis not present

## 2017-07-29 DIAGNOSIS — I10 Essential (primary) hypertension: Secondary | ICD-10-CM | POA: Diagnosis not present

## 2017-07-29 DIAGNOSIS — Z800 Family history of malignant neoplasm of digestive organs: Secondary | ICD-10-CM | POA: Diagnosis not present

## 2017-07-29 DIAGNOSIS — Z8546 Personal history of malignant neoplasm of prostate: Secondary | ICD-10-CM | POA: Diagnosis not present

## 2017-07-29 DIAGNOSIS — Z6823 Body mass index (BMI) 23.0-23.9, adult: Secondary | ICD-10-CM | POA: Diagnosis not present

## 2017-07-29 DIAGNOSIS — I4891 Unspecified atrial fibrillation: Secondary | ICD-10-CM | POA: Diagnosis not present

## 2017-07-29 DIAGNOSIS — C679 Malignant neoplasm of bladder, unspecified: Secondary | ICD-10-CM | POA: Diagnosis not present

## 2017-07-29 DIAGNOSIS — Z87891 Personal history of nicotine dependence: Secondary | ICD-10-CM | POA: Diagnosis not present

## 2017-07-29 DIAGNOSIS — Z5112 Encounter for antineoplastic immunotherapy: Secondary | ICD-10-CM | POA: Diagnosis not present

## 2017-07-29 DIAGNOSIS — Z006 Encounter for examination for normal comparison and control in clinical research program: Secondary | ICD-10-CM | POA: Diagnosis not present

## 2017-07-29 DIAGNOSIS — Z7901 Long term (current) use of anticoagulants: Secondary | ICD-10-CM | POA: Diagnosis not present

## 2017-08-03 ENCOUNTER — Other Ambulatory Visit: Payer: Self-pay | Admitting: Family Medicine

## 2017-08-19 DIAGNOSIS — I10 Essential (primary) hypertension: Secondary | ICD-10-CM | POA: Diagnosis not present

## 2017-08-19 DIAGNOSIS — N1339 Other hydronephrosis: Secondary | ICD-10-CM | POA: Diagnosis not present

## 2017-08-19 DIAGNOSIS — Z6823 Body mass index (BMI) 23.0-23.9, adult: Secondary | ICD-10-CM | POA: Diagnosis not present

## 2017-08-19 DIAGNOSIS — R918 Other nonspecific abnormal finding of lung field: Secondary | ICD-10-CM | POA: Diagnosis not present

## 2017-08-19 DIAGNOSIS — Z87891 Personal history of nicotine dependence: Secondary | ICD-10-CM | POA: Diagnosis not present

## 2017-08-19 DIAGNOSIS — I4891 Unspecified atrial fibrillation: Secondary | ICD-10-CM | POA: Diagnosis not present

## 2017-08-19 DIAGNOSIS — Z006 Encounter for examination for normal comparison and control in clinical research program: Secondary | ICD-10-CM | POA: Diagnosis not present

## 2017-08-19 DIAGNOSIS — C679 Malignant neoplasm of bladder, unspecified: Secondary | ICD-10-CM | POA: Diagnosis not present

## 2017-09-10 DIAGNOSIS — Z7901 Long term (current) use of anticoagulants: Secondary | ICD-10-CM | POA: Diagnosis not present

## 2017-09-10 DIAGNOSIS — K8020 Calculus of gallbladder without cholecystitis without obstruction: Secondary | ICD-10-CM | POA: Diagnosis not present

## 2017-09-10 DIAGNOSIS — Z8546 Personal history of malignant neoplasm of prostate: Secondary | ICD-10-CM | POA: Diagnosis not present

## 2017-09-10 DIAGNOSIS — Z8719 Personal history of other diseases of the digestive system: Secondary | ICD-10-CM | POA: Diagnosis not present

## 2017-09-10 DIAGNOSIS — Z6823 Body mass index (BMI) 23.0-23.9, adult: Secondary | ICD-10-CM | POA: Diagnosis not present

## 2017-09-10 DIAGNOSIS — N320 Bladder-neck obstruction: Secondary | ICD-10-CM | POA: Diagnosis not present

## 2017-09-10 DIAGNOSIS — Z906 Acquired absence of other parts of urinary tract: Secondary | ICD-10-CM | POA: Diagnosis not present

## 2017-09-10 DIAGNOSIS — Z9221 Personal history of antineoplastic chemotherapy: Secondary | ICD-10-CM | POA: Diagnosis not present

## 2017-09-10 DIAGNOSIS — K5730 Diverticulosis of large intestine without perforation or abscess without bleeding: Secondary | ICD-10-CM | POA: Diagnosis not present

## 2017-09-10 DIAGNOSIS — I10 Essential (primary) hypertension: Secondary | ICD-10-CM | POA: Diagnosis not present

## 2017-09-10 DIAGNOSIS — Z9079 Acquired absence of other genital organ(s): Secondary | ICD-10-CM | POA: Diagnosis not present

## 2017-09-10 DIAGNOSIS — K4090 Unilateral inguinal hernia, without obstruction or gangrene, not specified as recurrent: Secondary | ICD-10-CM | POA: Diagnosis not present

## 2017-09-10 DIAGNOSIS — C679 Malignant neoplasm of bladder, unspecified: Secondary | ICD-10-CM | POA: Diagnosis not present

## 2017-09-10 DIAGNOSIS — Z7952 Long term (current) use of systemic steroids: Secondary | ICD-10-CM | POA: Diagnosis not present

## 2017-09-10 DIAGNOSIS — Z79899 Other long term (current) drug therapy: Secondary | ICD-10-CM | POA: Diagnosis not present

## 2017-09-10 DIAGNOSIS — Z5112 Encounter for antineoplastic immunotherapy: Secondary | ICD-10-CM | POA: Diagnosis not present

## 2017-09-10 DIAGNOSIS — I482 Chronic atrial fibrillation: Secondary | ICD-10-CM | POA: Diagnosis not present

## 2017-09-10 DIAGNOSIS — Z923 Personal history of irradiation: Secondary | ICD-10-CM | POA: Diagnosis not present

## 2017-09-10 DIAGNOSIS — Z8619 Personal history of other infectious and parasitic diseases: Secondary | ICD-10-CM | POA: Diagnosis not present

## 2017-09-10 DIAGNOSIS — R918 Other nonspecific abnormal finding of lung field: Secondary | ICD-10-CM | POA: Diagnosis not present

## 2017-09-10 DIAGNOSIS — Z1501 Genetic susceptibility to malignant neoplasm of breast: Secondary | ICD-10-CM | POA: Diagnosis not present

## 2017-09-10 DIAGNOSIS — Z006 Encounter for examination for normal comparison and control in clinical research program: Secondary | ICD-10-CM | POA: Diagnosis not present

## 2017-09-10 DIAGNOSIS — M4856XA Collapsed vertebra, not elsewhere classified, lumbar region, initial encounter for fracture: Secondary | ICD-10-CM | POA: Diagnosis not present

## 2017-09-10 DIAGNOSIS — Z87891 Personal history of nicotine dependence: Secondary | ICD-10-CM | POA: Diagnosis not present

## 2017-09-10 DIAGNOSIS — Z87440 Personal history of urinary (tract) infections: Secondary | ICD-10-CM | POA: Diagnosis not present

## 2017-10-01 DIAGNOSIS — I482 Chronic atrial fibrillation: Secondary | ICD-10-CM | POA: Diagnosis not present

## 2017-10-01 DIAGNOSIS — Z5111 Encounter for antineoplastic chemotherapy: Secondary | ICD-10-CM | POA: Diagnosis not present

## 2017-10-01 DIAGNOSIS — R918 Other nonspecific abnormal finding of lung field: Secondary | ICD-10-CM | POA: Diagnosis not present

## 2017-10-01 DIAGNOSIS — Z8546 Personal history of malignant neoplasm of prostate: Secondary | ICD-10-CM | POA: Diagnosis not present

## 2017-10-01 DIAGNOSIS — C679 Malignant neoplasm of bladder, unspecified: Secondary | ICD-10-CM | POA: Diagnosis not present

## 2017-10-01 DIAGNOSIS — I10 Essential (primary) hypertension: Secondary | ICD-10-CM | POA: Diagnosis not present

## 2017-10-01 DIAGNOSIS — Z923 Personal history of irradiation: Secondary | ICD-10-CM | POA: Diagnosis not present

## 2017-10-01 DIAGNOSIS — Z7901 Long term (current) use of anticoagulants: Secondary | ICD-10-CM | POA: Diagnosis not present

## 2017-10-01 DIAGNOSIS — Z79899 Other long term (current) drug therapy: Secondary | ICD-10-CM | POA: Diagnosis not present

## 2017-10-01 DIAGNOSIS — Z87891 Personal history of nicotine dependence: Secondary | ICD-10-CM | POA: Diagnosis not present

## 2017-10-10 ENCOUNTER — Other Ambulatory Visit: Payer: Self-pay | Admitting: Family Medicine

## 2017-10-10 DIAGNOSIS — I480 Paroxysmal atrial fibrillation: Secondary | ICD-10-CM

## 2017-10-12 NOTE — Telephone Encounter (Signed)
Electronic refill request.  Eliquis Last office visit:   08/18/16 Last Filled:    180 tablet 1 01/30/2017  Please advise.

## 2017-10-13 ENCOUNTER — Encounter: Payer: Self-pay | Admitting: *Deleted

## 2017-10-13 NOTE — Telephone Encounter (Signed)
Letter mailed

## 2017-10-13 NOTE — Telephone Encounter (Signed)
Needs OV when possible, 3min.  rx sent.  Thanks.

## 2017-10-27 ENCOUNTER — Ambulatory Visit (INDEPENDENT_AMBULATORY_CARE_PROVIDER_SITE_OTHER): Payer: Medicare HMO | Admitting: Family Medicine

## 2017-10-27 ENCOUNTER — Encounter: Payer: Self-pay | Admitting: Family Medicine

## 2017-10-27 VITALS — BP 120/60 | HR 60 | Temp 97.8°F | Wt 168.5 lb

## 2017-10-27 DIAGNOSIS — I10 Essential (primary) hypertension: Secondary | ICD-10-CM | POA: Diagnosis not present

## 2017-10-27 DIAGNOSIS — Z8679 Personal history of other diseases of the circulatory system: Secondary | ICD-10-CM

## 2017-10-27 DIAGNOSIS — C679 Malignant neoplasm of bladder, unspecified: Secondary | ICD-10-CM

## 2017-10-27 DIAGNOSIS — Z7189 Other specified counseling: Secondary | ICD-10-CM

## 2017-10-27 NOTE — Patient Instructions (Signed)
Please call about a follow up with cardiology.   Lurline Del MD.  [phone] (Work) [phone] (Fax) [address] [place], [place] [address]  Don't change your meds for now.  Take care.  Glad to see you.  Update me as needed.

## 2017-10-27 NOTE — Progress Notes (Signed)
Bladder cancer per [facility].  I'll defer re: treatment.  He has tolerated his treatment.  He is dealing with urostomy and colostomy and "live isn't the same as it was before the two bags."  He is sleeping okay, is playing golf, playing ping pong at [facility].   TSH wnl, ferritin stable, Cr stable.  Hgb still ~12.  He has chemo every 3 weeks with CT scanning every 12 weeks.  Recent labs d/w pt.  No FCNAVD.  No blood in either bag.    Living will d/w pt.  Daughter Izora Gala designated if patient were incapacitated.    Hypertension and AF s/p ablation.  Using medication without problems or lightheadedness: yes Chest pain with exertion:no Edema:no Short of breath:no No heart racing.    Meds, vitals, and allergies reviewed.   PMH and SH reviewed  ROS: Per HPI unless specifically indicated in ROS section   GEN: nad, alert and oriented HEENT: mucous membranes moist NECK: supple w/o LA CV: rrr. PULM: ctab, no inc wob ABD: soft, +bs EXT: no edema

## 2017-10-29 ENCOUNTER — Encounter: Payer: Self-pay | Admitting: Family Medicine

## 2017-10-29 DIAGNOSIS — Z8679 Personal history of other diseases of the circulatory system: Secondary | ICD-10-CM | POA: Insufficient documentation

## 2017-10-29 DIAGNOSIS — Z7189 Other specified counseling: Secondary | ICD-10-CM | POA: Insufficient documentation

## 2017-10-29 DIAGNOSIS — I10 Essential (primary) hypertension: Secondary | ICD-10-CM | POA: Insufficient documentation

## 2017-10-29 NOTE — Assessment & Plan Note (Signed)
Bladder cancer per [facility].  I'll defer re: treatment.  He has tolerated his treatment.  He is dealing with urostomy and colostomy and "live isn't the same as it was before the two bags."  He is sleeping okay, is playing golf, playing ping pong at [facility].   TSH wnl, ferritin stable, Cr stable.  Hgb still ~12.  He has chemo every 3 weeks with CT scanning every 12 weeks.  Recent labs d/w pt.  No FCNAVD.  No blood in either bag.  Continue as is.  He agrees.

## 2017-10-29 NOTE — Assessment & Plan Note (Signed)
>  25 minutes spent in face to face time with patient, >50% spent in counselling or coordination of care, discussing hypertension, medication, previous labs.  Reasonable control.  Continue as is.  No change in meds.  He agrees.  Update me as needed.

## 2017-10-29 NOTE — Assessment & Plan Note (Signed)
Living will d/w pt.  Daughter Izora Gala designated if patient were incapacitated.

## 2017-10-29 NOTE — Assessment & Plan Note (Addendum)
Status post ablation.  I asked him to call about follow-up with cardiology.  He agrees.  No recent tachyarrhythmias that the patient has noted.  He sounds to be regular on exam today.

## 2017-11-09 ENCOUNTER — Other Ambulatory Visit: Payer: Self-pay | Admitting: Family Medicine

## 2017-11-10 NOTE — Telephone Encounter (Signed)
Electronic refill request. Senna 8.6 mg Laxative Last office visit:   10/27/17 Last Filled:   [facility] SEN[facility] 8.6 MG tablet  Medication  Date: 06/22/2017 Department: [facility] Ordering/Authorizing: Tonia Ghent, MD  Order Providers   Prescribing Provider Encounter Provider  Tonia Ghent, MD Tonia Ghent, MD  Medication Detail    Disp Refills Start End   [facility] SENNA 8.6 MG tablet 60 tablet 3 06/22/2017    Please advise.

## 2017-11-11 NOTE — Telephone Encounter (Signed)
Sent. Thanks.   

## 2017-11-19 ENCOUNTER — Telehealth: Payer: Self-pay | Admitting: *Deleted

## 2017-11-19 NOTE — Telephone Encounter (Signed)
Faxed order form received for [employer].  Placed form in Dr. Buckner Malta In [place].

## 2017-11-20 DIAGNOSIS — H6123 Impacted cerumen, bilateral: Secondary | ICD-10-CM | POA: Diagnosis not present

## 2017-11-20 DIAGNOSIS — H9190 Unspecified hearing loss, unspecified ear: Secondary | ICD-10-CM | POA: Diagnosis not present

## 2017-11-20 NOTE — Telephone Encounter (Signed)
I'll work on the hard copy.  Thanks.  

## 2017-11-23 NOTE — Telephone Encounter (Signed)
Faxed and scanned

## 2017-12-02 DIAGNOSIS — Z433 Encounter for attention to colostomy: Secondary | ICD-10-CM | POA: Diagnosis not present

## 2017-12-02 NOTE — Telephone Encounter (Signed)
Eunic is calling from [employer] is calling in regards Ostomy and [employer]. She states she has not received the one for Urostomy.   CB# [phone]

## 2017-12-03 DIAGNOSIS — Z433 Encounter for attention to colostomy: Secondary | ICD-10-CM | POA: Diagnosis not present

## 2017-12-03 NOTE — Telephone Encounter (Addendum)
Faxed with confirmation.

## 2018-01-13 DIAGNOSIS — C679 Malignant neoplasm of bladder, unspecified: Secondary | ICD-10-CM | POA: Diagnosis not present

## 2018-01-13 DIAGNOSIS — C61 Malignant neoplasm of prostate: Secondary | ICD-10-CM | POA: Diagnosis not present

## 2018-02-12 ENCOUNTER — Telehealth: Payer: Self-pay | Admitting: Family Medicine

## 2018-02-12 ENCOUNTER — Ambulatory Visit: Payer: Medicare HMO | Admitting: Family Medicine

## 2018-02-12 ENCOUNTER — Encounter: Payer: Self-pay | Admitting: Family Medicine

## 2018-02-12 DIAGNOSIS — M545 Low back pain, unspecified: Secondary | ICD-10-CM | POA: Insufficient documentation

## 2018-02-12 MED ORDER — METHOCARBAMOL 500 MG PO TABS: 500.0000 mg | ORAL_TABLET | Freq: Three times a day (TID) | ORAL | 1 refills | Status: DC | PRN

## 2018-02-12 MED ORDER — TIZANIDINE HCL 4 MG PO CAPS: 4.0000 mg | ORAL_CAPSULE | Freq: Three times a day (TID) | ORAL | 1 refills | Status: DC | PRN

## 2018-02-12 NOTE — Telephone Encounter (Signed)
I sent it  

## 2018-02-12 NOTE — Telephone Encounter (Signed)
Received a massage from [facility] stating Methocarbamol is not cover by insurance but they will cover Tizanidine 4 mg. Please advise.

## 2018-02-12 NOTE — Progress Notes (Signed)
Subjective:    Patient ID: Timothy Bullock, male    DOB: [birth year], 84 y.o.   MRN: [id number]  HPI 84 yo pt of Dr Damita Dunnings here with painful back spasms Hx of bladder cancer with urostomy/colostomy in the paste   Wt Readings from Last 3 Encounters:  02/12/18 166 lb (75.3 kg)  10/27/17 168 lb 8 oz (76.4 kg)  08/18/16 146 lb 8 oz (66.5 kg)   23.82 kg/m  Last time he had this was many years ago   Played golf on Friday as usual   (may have felt something the last 2 holes)  Next day he felt a twinge in low back  Full blown by Tuesday  Very painful -hard to move   Across low back-both sides  Sharp and dull pain  advil helps some    2 pills bid  Used some heat and that helped  No radiation of pain to legs  No numbness or weakness  No problems with gait / lifting toes fine   This am -painful getting out of bed Then improved after movement and heat   Patient Active Problem List   Diagnosis Date Noted  . Lumbar back pain 02/12/2018  . Advance care planning 10/29/2017  . Hypertension 10/29/2017  . History of atrial fibrillation 10/29/2017  . Attention to urostomy [facility]) 03/08/2017  . 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 History of urostomy 12/31/2016  . Anemia 08/19/2016  . Bladder cancer ([place]) 04/18/2016  . Loss of weight 01/03/2016  . 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) 07/22/2012  . GASTRIC ULCER 07/23/2010  . Blood in stool 07/23/2010   Past Medical History:  Diagnosis Date  . ARF (acute renal failure) ([place])   . Bladder cancer ([place])   . Blood in stool   . 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 Diverticulitis   . H/O cardiac radiofrequency ablation   . HTN (hypertension)   . Paroxysmal atrial fibrillation ([place]) 05/28/2012   s/p ablation  . Prostate cancer [facility])    s/p resection  . Ulcer    gastric  . Urinary incontinence    Past Surgical History:  Procedure Laterality Date  . BLADDER REMOVAL    . CARDIAC CATHETERIZATION   2013   [facility]  . COLONOSCOPY  02/2012   negative pathology   . COLOSTOMY     with bladder cancer surgery 2017  . OTHER SURGICAL HISTORY     urostomy  . PERCUTANEOUS NEPHROSTOMY  2017  . PROSTATECTOMY  1993   Social History   Tobacco Use  . Smoking status: Former Smoker    Types: Cigarettes    Last attempt to quit: 09/15/1965    Years since quitting: 52.4  . Smokeless tobacco: Never Used  Substance Use Topics  . Alcohol use: Yes    Alcohol/week: 0.0 oz    Comment: Rare  . Drug use: No   Family History  Problem Relation Age of Onset  . Cancer Mother        H/O colon CA  . Alcohol abuse Father   . Cancer Father        CA of tongue  . Cancer Other        colon, parents <60   No Known Allergies Current Outpatient Medications on File Prior to Visit  Medication Sig Dispense Refill  . amiodarone (PACERONE) 200 MG tablet TAKE 1 TABLET BY MOUTH  DAILY 90 tablet 1  . amLODipine (NORVASC) 5 MG  tablet Take 5 mg by mouth daily.    Marland Kitchen ELIQUIS 5 MG TABS tablet TAKE 1 TABLET BY MOUTH TWO  TIMES DAILY 180 tablet 1  . ferrous sulfate 325 (65 FE) MG tablet TAKE 1 TABLET BY MOUTH EVERY DAY WITH BREAKFAST 30 tablet 5  . fludrocortisone (FLORINEF) 0.1 MG tablet TAKE 1 TABLET BY MOUTH  DAILY 90 tablet 0  . senna ([facility] SENNA) 8.6 MG tablet Take 2 tablets (17.2 mg total) by mouth at bedtime as needed for constipation. 60 tablet 3   No current facility-administered medications on file prior to visit.      Review of Systems  Constitutional: Negative for activity change, appetite change, fatigue, fever and unexpected weight change.  HENT: Negative for congestion, rhinorrhea, sore throat and trouble swallowing.   Eyes: Negative for pain, redness, itching and visual disturbance.  Respiratory: Negative for cough, chest tightness, shortness of breath and wheezing.   Cardiovascular: Negative for chest pain and palpitations.  Gastrointestinal: Negative for abdominal pain, blood in stool, constipation,  diarrhea and nausea.  Endocrine: Negative for cold intolerance, heat intolerance, polydipsia and polyuria.  Genitourinary: Negative for difficulty urinating, dysuria, frequency and urgency.  Musculoskeletal: Positive for back pain. Negative for arthralgias, joint swelling and myalgias.  Skin: Negative for pallor and rash.  Neurological: Negative for dizziness, tremors, weakness, numbness and headaches.  Hematological: Negative for adenopathy. Does not bruise/bleed easily.  Psychiatric/Behavioral: Negative for decreased concentration and dysphoric mood. The patient is not nervous/anxious.        Objective:   Physical Exam  Constitutional: He appears well-developed and well-nourished. No distress.  Well appearing elderly male  HENT:  Head: Normocephalic and atraumatic.  Eyes: Pupils are equal, round, and reactive to light. Conjunctivae and EOM are normal. No scleral icterus.  Neck: Normal range of motion. Neck supple.  Cardiovascular: Normal rate and regular rhythm.  Pulmonary/Chest: Effort normal and breath sounds normal. He has no wheezes. He has no rales.  Abdominal: Soft. Bowel sounds are normal. He exhibits no distension. There is no tenderness.  Musculoskeletal: He exhibits tenderness. He exhibits no edema or deformity.       Right shoulder: He exhibits decreased range of motion, tenderness and spasm. He exhibits no bony tenderness, no crepitus, no deformity, normal pulse and normal strength.       Lumbar back: He exhibits decreased range of motion, tenderness and spasm. He exhibits no bony tenderness and no edema.  Some bilateral lumbar muscular tightness and spasm  Pain on full flex and bilateral lateral bend Nl gait  No neuro changes  No bony tenderness  Lymphadenopathy:    He has no cervical adenopathy.  Neurological: He is alert. He has normal strength and normal reflexes. He displays no atrophy. No cranial nerve deficit or sensory deficit. He exhibits normal muscle tone.  Coordination normal.  Negative SLR  Skin: Skin is warm and dry. No rash noted. No erythema. No pallor.  Psychiatric: He has a normal mood and affect.  Pleasant           Assessment & Plan:   Problem List Items Addressed This Visit      Other   Lumbar back pain    Lumbar spasm- starting to improve  Recommend heat/new mattress if needed and stretching Avoid nsaids in light of anticoag and past PUD  Px muscle relaxer (tizanidine because ins did not cover robaxin) Warning of sedation/fall potential If no imp -consider films / PT  Update if not starting to  improve in a week or if worsening         Relevant Medications   methocarbamol (ROBAXIN) 500 MG tablet

## 2018-02-12 NOTE — Patient Instructions (Signed)
Stop advil  Try the robaxin (muscle relaxer) - watch out for sedation  Continue heat for 10 minutes at a time   Walking helps back spasms  Get a new mattress if it is time  Also wear your new shoes (this affects gait which affects the back as well)   If no improvement next week - call and I will refer you to physical therapy   Take a look at the back stretches as well

## 2018-02-14 NOTE — Assessment & Plan Note (Signed)
Lumbar spasm- starting to improve  Recommend heat/new mattress if needed and stretching Avoid nsaids in light of anticoag and past PUD  Px muscle relaxer (tizanidine because ins did not cover robaxin) Warning of sedation/fall potential If no imp -consider films / PT  Update if not starting to improve in a week or if worsening

## 2018-02-16 ENCOUNTER — Telehealth: Payer: Self-pay | Admitting: *Deleted

## 2018-02-16 NOTE — Telephone Encounter (Signed)
PA submitted thru CMM for Methocarbamol, awaiting response.

## 2018-02-16 NOTE — Telephone Encounter (Signed)
PA approved thru 02/17/2019.  Approval letter scanned into EPIC

## 2018-02-23 DIAGNOSIS — M62830 Muscle spasm of back: Secondary | ICD-10-CM | POA: Diagnosis not present

## 2018-02-23 DIAGNOSIS — M9903 Segmental and somatic dysfunction of lumbar region: Secondary | ICD-10-CM | POA: Diagnosis not present

## 2018-02-23 DIAGNOSIS — M9902 Segmental and somatic dysfunction of thoracic region: Secondary | ICD-10-CM | POA: Diagnosis not present

## 2018-02-23 DIAGNOSIS — M5136 Other intervertebral disc degeneration, lumbar region: Secondary | ICD-10-CM | POA: Diagnosis not present

## 2018-02-25 DIAGNOSIS — M9903 Segmental and somatic dysfunction of lumbar region: Secondary | ICD-10-CM | POA: Diagnosis not present

## 2018-02-25 DIAGNOSIS — M62830 Muscle spasm of back: Secondary | ICD-10-CM | POA: Diagnosis not present

## 2018-02-25 DIAGNOSIS — M9902 Segmental and somatic dysfunction of thoracic region: Secondary | ICD-10-CM | POA: Diagnosis not present

## 2018-02-25 DIAGNOSIS — M5136 Other intervertebral disc degeneration, lumbar region: Secondary | ICD-10-CM | POA: Diagnosis not present

## 2018-02-26 DIAGNOSIS — M5136 Other intervertebral disc degeneration, lumbar region: Secondary | ICD-10-CM | POA: Diagnosis not present

## 2018-02-26 DIAGNOSIS — M9902 Segmental and somatic dysfunction of thoracic region: Secondary | ICD-10-CM | POA: Diagnosis not present

## 2018-02-26 DIAGNOSIS — M62830 Muscle spasm of back: Secondary | ICD-10-CM | POA: Diagnosis not present

## 2018-02-26 DIAGNOSIS — M9903 Segmental and somatic dysfunction of lumbar region: Secondary | ICD-10-CM | POA: Diagnosis not present

## 2018-03-01 DIAGNOSIS — M62830 Muscle spasm of back: Secondary | ICD-10-CM | POA: Diagnosis not present

## 2018-03-01 DIAGNOSIS — M9902 Segmental and somatic dysfunction of thoracic region: Secondary | ICD-10-CM | POA: Diagnosis not present

## 2018-03-01 DIAGNOSIS — M5136 Other intervertebral disc degeneration, lumbar region: Secondary | ICD-10-CM | POA: Diagnosis not present

## 2018-03-01 DIAGNOSIS — M9903 Segmental and somatic dysfunction of lumbar region: Secondary | ICD-10-CM | POA: Diagnosis not present

## 2018-03-03 DIAGNOSIS — M5136 Other intervertebral disc degeneration, lumbar region: Secondary | ICD-10-CM | POA: Diagnosis not present

## 2018-03-03 DIAGNOSIS — M9902 Segmental and somatic dysfunction of thoracic region: Secondary | ICD-10-CM | POA: Diagnosis not present

## 2018-03-03 DIAGNOSIS — M62830 Muscle spasm of back: Secondary | ICD-10-CM | POA: Diagnosis not present

## 2018-03-03 DIAGNOSIS — M9903 Segmental and somatic dysfunction of lumbar region: Secondary | ICD-10-CM | POA: Diagnosis not present

## 2018-03-04 DIAGNOSIS — M9903 Segmental and somatic dysfunction of lumbar region: Secondary | ICD-10-CM | POA: Diagnosis not present

## 2018-03-04 DIAGNOSIS — M62830 Muscle spasm of back: Secondary | ICD-10-CM | POA: Diagnosis not present

## 2018-03-04 DIAGNOSIS — M9902 Segmental and somatic dysfunction of thoracic region: Secondary | ICD-10-CM | POA: Diagnosis not present

## 2018-03-04 DIAGNOSIS — M5136 Other intervertebral disc degeneration, lumbar region: Secondary | ICD-10-CM | POA: Diagnosis not present

## 2018-03-08 DIAGNOSIS — M9902 Segmental and somatic dysfunction of thoracic region: Secondary | ICD-10-CM | POA: Diagnosis not present

## 2018-03-08 DIAGNOSIS — M5136 Other intervertebral disc degeneration, lumbar region: Secondary | ICD-10-CM | POA: Diagnosis not present

## 2018-03-08 DIAGNOSIS — M9903 Segmental and somatic dysfunction of lumbar region: Secondary | ICD-10-CM | POA: Diagnosis not present

## 2018-03-08 DIAGNOSIS — M62830 Muscle spasm of back: Secondary | ICD-10-CM | POA: Diagnosis not present

## 2018-03-10 DIAGNOSIS — M9902 Segmental and somatic dysfunction of thoracic region: Secondary | ICD-10-CM | POA: Diagnosis not present

## 2018-03-10 DIAGNOSIS — M9903 Segmental and somatic dysfunction of lumbar region: Secondary | ICD-10-CM | POA: Diagnosis not present

## 2018-03-10 DIAGNOSIS — Z433 Encounter for attention to colostomy: Secondary | ICD-10-CM | POA: Diagnosis not present

## 2018-03-10 DIAGNOSIS — M5136 Other intervertebral disc degeneration, lumbar region: Secondary | ICD-10-CM | POA: Diagnosis not present

## 2018-03-10 DIAGNOSIS — M62830 Muscle spasm of back: Secondary | ICD-10-CM | POA: Diagnosis not present

## 2018-03-11 DIAGNOSIS — M62830 Muscle spasm of back: Secondary | ICD-10-CM | POA: Diagnosis not present

## 2018-03-11 DIAGNOSIS — M5136 Other intervertebral disc degeneration, lumbar region: Secondary | ICD-10-CM | POA: Diagnosis not present

## 2018-03-11 DIAGNOSIS — M9903 Segmental and somatic dysfunction of lumbar region: Secondary | ICD-10-CM | POA: Diagnosis not present

## 2018-03-11 DIAGNOSIS — M9902 Segmental and somatic dysfunction of thoracic region: Secondary | ICD-10-CM | POA: Diagnosis not present

## 2018-03-15 DIAGNOSIS — M5136 Other intervertebral disc degeneration, lumbar region: Secondary | ICD-10-CM | POA: Diagnosis not present

## 2018-03-15 DIAGNOSIS — M9903 Segmental and somatic dysfunction of lumbar region: Secondary | ICD-10-CM | POA: Diagnosis not present

## 2018-03-15 DIAGNOSIS — M9902 Segmental and somatic dysfunction of thoracic region: Secondary | ICD-10-CM | POA: Diagnosis not present

## 2018-03-15 DIAGNOSIS — M62830 Muscle spasm of back: Secondary | ICD-10-CM | POA: Diagnosis not present

## 2018-03-16 DIAGNOSIS — M9903 Segmental and somatic dysfunction of lumbar region: Secondary | ICD-10-CM | POA: Diagnosis not present

## 2018-03-16 DIAGNOSIS — M5136 Other intervertebral disc degeneration, lumbar region: Secondary | ICD-10-CM | POA: Diagnosis not present

## 2018-03-16 DIAGNOSIS — M62830 Muscle spasm of back: Secondary | ICD-10-CM | POA: Diagnosis not present

## 2018-03-16 DIAGNOSIS — M9902 Segmental and somatic dysfunction of thoracic region: Secondary | ICD-10-CM | POA: Diagnosis not present

## 2018-03-22 DIAGNOSIS — M9903 Segmental and somatic dysfunction of lumbar region: Secondary | ICD-10-CM | POA: Diagnosis not present

## 2018-03-22 DIAGNOSIS — M5136 Other intervertebral disc degeneration, lumbar region: Secondary | ICD-10-CM | POA: Diagnosis not present

## 2018-03-22 DIAGNOSIS — M9902 Segmental and somatic dysfunction of thoracic region: Secondary | ICD-10-CM | POA: Diagnosis not present

## 2018-03-22 DIAGNOSIS — M62830 Muscle spasm of back: Secondary | ICD-10-CM | POA: Diagnosis not present

## 2018-03-25 DIAGNOSIS — M9902 Segmental and somatic dysfunction of thoracic region: Secondary | ICD-10-CM | POA: Diagnosis not present

## 2018-03-25 DIAGNOSIS — M5136 Other intervertebral disc degeneration, lumbar region: Secondary | ICD-10-CM | POA: Diagnosis not present

## 2018-03-25 DIAGNOSIS — M62830 Muscle spasm of back: Secondary | ICD-10-CM | POA: Diagnosis not present

## 2018-03-25 DIAGNOSIS — M9903 Segmental and somatic dysfunction of lumbar region: Secondary | ICD-10-CM | POA: Diagnosis not present

## 2018-03-31 DIAGNOSIS — M5136 Other intervertebral disc degeneration, lumbar region: Secondary | ICD-10-CM | POA: Diagnosis not present

## 2018-03-31 DIAGNOSIS — M62830 Muscle spasm of back: Secondary | ICD-10-CM | POA: Diagnosis not present

## 2018-03-31 DIAGNOSIS — M9903 Segmental and somatic dysfunction of lumbar region: Secondary | ICD-10-CM | POA: Diagnosis not present

## 2018-03-31 DIAGNOSIS — M9902 Segmental and somatic dysfunction of thoracic region: Secondary | ICD-10-CM | POA: Diagnosis not present

## 2018-04-01 ENCOUNTER — Other Ambulatory Visit: Payer: Self-pay | Admitting: Family Medicine

## 2018-04-06 DIAGNOSIS — M9903 Segmental and somatic dysfunction of lumbar region: Secondary | ICD-10-CM | POA: Diagnosis not present

## 2018-04-06 DIAGNOSIS — M62830 Muscle spasm of back: Secondary | ICD-10-CM | POA: Diagnosis not present

## 2018-04-06 DIAGNOSIS — M5136 Other intervertebral disc degeneration, lumbar region: Secondary | ICD-10-CM | POA: Diagnosis not present

## 2018-04-06 DIAGNOSIS — M9902 Segmental and somatic dysfunction of thoracic region: Secondary | ICD-10-CM | POA: Diagnosis not present

## 2018-04-13 DIAGNOSIS — M9902 Segmental and somatic dysfunction of thoracic region: Secondary | ICD-10-CM | POA: Diagnosis not present

## 2018-04-13 DIAGNOSIS — M62830 Muscle spasm of back: Secondary | ICD-10-CM | POA: Diagnosis not present

## 2018-04-13 DIAGNOSIS — M5136 Other intervertebral disc degeneration, lumbar region: Secondary | ICD-10-CM | POA: Diagnosis not present

## 2018-04-13 DIAGNOSIS — M9903 Segmental and somatic dysfunction of lumbar region: Secondary | ICD-10-CM | POA: Diagnosis not present

## 2018-04-14 ENCOUNTER — Other Ambulatory Visit: Payer: Self-pay | Admitting: Family Medicine

## 2018-04-14 DIAGNOSIS — I480 Paroxysmal atrial fibrillation: Secondary | ICD-10-CM

## 2018-04-14 NOTE — Telephone Encounter (Signed)
Sent. Thanks.   

## 2018-04-14 NOTE — Telephone Encounter (Signed)
Electronic refill request Last office visit 10/27/17 Last refill 10/13/17 #180/1

## 2018-04-27 DIAGNOSIS — M62830 Muscle spasm of back: Secondary | ICD-10-CM | POA: Diagnosis not present

## 2018-04-27 DIAGNOSIS — M9902 Segmental and somatic dysfunction of thoracic region: Secondary | ICD-10-CM | POA: Diagnosis not present

## 2018-04-27 DIAGNOSIS — M5136 Other intervertebral disc degeneration, lumbar region: Secondary | ICD-10-CM | POA: Diagnosis not present

## 2018-04-27 DIAGNOSIS — M9903 Segmental and somatic dysfunction of lumbar region: Secondary | ICD-10-CM | POA: Diagnosis not present

## 2018-05-11 DIAGNOSIS — M62830 Muscle spasm of back: Secondary | ICD-10-CM | POA: Diagnosis not present

## 2018-05-11 DIAGNOSIS — M5136 Other intervertebral disc degeneration, lumbar region: Secondary | ICD-10-CM | POA: Diagnosis not present

## 2018-05-11 DIAGNOSIS — M9902 Segmental and somatic dysfunction of thoracic region: Secondary | ICD-10-CM | POA: Diagnosis not present

## 2018-05-11 DIAGNOSIS — M9903 Segmental and somatic dysfunction of lumbar region: Secondary | ICD-10-CM | POA: Diagnosis not present

## 2018-05-25 DIAGNOSIS — M5136 Other intervertebral disc degeneration, lumbar region: Secondary | ICD-10-CM | POA: Diagnosis not present

## 2018-05-25 DIAGNOSIS — M62830 Muscle spasm of back: Secondary | ICD-10-CM | POA: Diagnosis not present

## 2018-05-25 DIAGNOSIS — M9903 Segmental and somatic dysfunction of lumbar region: Secondary | ICD-10-CM | POA: Diagnosis not present

## 2018-05-25 DIAGNOSIS — M9902 Segmental and somatic dysfunction of thoracic region: Secondary | ICD-10-CM | POA: Diagnosis not present

## 2018-05-31 ENCOUNTER — Ambulatory Visit (INDEPENDENT_AMBULATORY_CARE_PROVIDER_SITE_OTHER): Payer: Medicare HMO | Admitting: Family Medicine

## 2018-05-31 ENCOUNTER — Encounter: Payer: Self-pay | Admitting: Family Medicine

## 2018-05-31 VITALS — BP 122/60 | HR 99 | Temp 97.5°F | Ht 70.0 in | Wt 165.0 lb

## 2018-05-31 DIAGNOSIS — Z933 Colostomy status: Secondary | ICD-10-CM | POA: Diagnosis not present

## 2018-05-31 DIAGNOSIS — Z436 Encounter for attention to other artificial openings of urinary tract: Secondary | ICD-10-CM | POA: Diagnosis not present

## 2018-05-31 DIAGNOSIS — C679 Malignant neoplasm of bladder, unspecified: Secondary | ICD-10-CM

## 2018-05-31 DIAGNOSIS — Z8679 Personal history of other diseases of the circulatory system: Secondary | ICD-10-CM | POA: Diagnosis not present

## 2018-05-31 MED ORDER — METOPROLOL TARTRATE 25 MG PO TABS: 12.5000 mg | ORAL_TABLET | Freq: Two times a day (BID) | ORAL | 3 refills | Status: DC | PRN

## 2018-05-31 MED ORDER — AMIODARONE HCL 200 MG PO TABS: 200.0000 mg | ORAL_TABLET | Freq: Every day | ORAL | 0 refills | Status: DC

## 2018-05-31 NOTE — Patient Instructions (Signed)
No labs today.   Take metoprolol is needed for heart racing.  Update me as needed.  Take care.  Glad to see you.

## 2018-05-31 NOTE — Progress Notes (Signed)
Recent events and status discussed with patient.  Metastatic UC:  Initiated first-line treatment for metastatic UC per [id number] protocol with gem/carbo +/- atezo on 10/22/2016-present.    He has seen Genetics who does not recommend any additional genetic testing  HTN: Continues on 5mg  Amlodipine, follows with with cardiologist   H/o Weight loss: stable at this time  Wife, Hoyle Sauer receiving treatment for recurrent lung CA.  IDA.  Still on iron.  HGB stable at ~12.   LFTs not elevated.  Cr 1.29.  D/w pt. He is on anticoagulation with Eliquis  He has noted episodic HR elevation in the last few days.  Still on eliquis.  Episodes would last about a few hours.  No CP.  Not SOB.  He could feel heart racing but he didn't have CP.  He did have fatigue with the episodes.  He is s/p ablation.  No bleeding.    Recent TSH 1.024.    He has seen Nani Gasser, MD prev with cards at: [address]  [place], [place] [address]  [phone]  [phone] (Fax)   He is putting up with this situation with colostomy and urostomy and still playing table tennis every Monday.  It is golfing and trying to have as good a day as he can.  D/w pt.  He is managing.    ========================================== GEN: nad, alert and oriented HEENT: mucous membranes moist NECK: supple w/o LA CV: IRR but not tachy.  PULM: ctab, no inc wob ABD: soft, +bs EXT: no edema SKIN: Well-perfused

## 2018-06-02 NOTE — Assessment & Plan Note (Signed)
Per [facility].  See above.  I will defer.  He agrees.

## 2018-06-02 NOTE — Assessment & Plan Note (Addendum)
He is still anticoagulated.  He is noticing episodes of increased heart rate over the last few days.  No chest pain.  Not short of breath.  He does have fatigue with episodes.  Discussed options.  Status post ablation.  He has seen cardiology previously.  We will ask cardiology to re-evaluate the patient in the meantime.  I gave him a prescription for metoprolol to use as needed.  See med order.  Patient agrees with plan.  Routine cautions given.  He sounds to be irregularly irregular on exam but not tacky.  I suspect he is back in A. Fib, at least episodically. >25 minutes spent in face to face time with patient, >50% spent in counselling or coordination of care.

## 2018-06-02 NOTE — Assessment & Plan Note (Signed)
See discussion of colostomy.  The same applies.

## 2018-06-02 NOTE — Assessment & Plan Note (Signed)
He is putting up with his current situation.  Continue as is.  He agrees.

## 2018-06-10 ENCOUNTER — Encounter: Payer: Self-pay | Admitting: *Deleted

## 2018-06-10 DIAGNOSIS — D1801 Hemangioma of skin and subcutaneous tissue: Secondary | ICD-10-CM | POA: Diagnosis not present

## 2018-06-10 DIAGNOSIS — L821 Other seborrheic keratosis: Secondary | ICD-10-CM | POA: Diagnosis not present

## 2018-06-10 DIAGNOSIS — C44519 Basal cell carcinoma of skin of other part of trunk: Secondary | ICD-10-CM | POA: Diagnosis not present

## 2018-06-10 DIAGNOSIS — L570 Actinic keratosis: Secondary | ICD-10-CM | POA: Diagnosis not present

## 2018-06-10 DIAGNOSIS — Z85828 Personal history of other malignant neoplasm of skin: Secondary | ICD-10-CM | POA: Diagnosis not present

## 2018-06-10 DIAGNOSIS — D0439 Carcinoma in situ of skin of other parts of face: Secondary | ICD-10-CM | POA: Diagnosis not present

## 2018-06-10 DIAGNOSIS — D485 Neoplasm of uncertain behavior of skin: Secondary | ICD-10-CM | POA: Diagnosis not present

## 2018-06-14 DIAGNOSIS — Z433 Encounter for attention to colostomy: Secondary | ICD-10-CM | POA: Diagnosis not present

## 2018-06-15 DIAGNOSIS — M5136 Other intervertebral disc degeneration, lumbar region: Secondary | ICD-10-CM | POA: Diagnosis not present

## 2018-06-15 DIAGNOSIS — M9903 Segmental and somatic dysfunction of lumbar region: Secondary | ICD-10-CM | POA: Diagnosis not present

## 2018-06-15 DIAGNOSIS — M9902 Segmental and somatic dysfunction of thoracic region: Secondary | ICD-10-CM | POA: Diagnosis not present

## 2018-06-15 DIAGNOSIS — Z433 Encounter for attention to colostomy: Secondary | ICD-10-CM | POA: Diagnosis not present

## 2018-06-15 DIAGNOSIS — M62830 Muscle spasm of back: Secondary | ICD-10-CM | POA: Diagnosis not present

## 2018-06-19 ENCOUNTER — Other Ambulatory Visit: Payer: Self-pay | Admitting: Family Medicine

## 2018-07-13 DIAGNOSIS — M9903 Segmental and somatic dysfunction of lumbar region: Secondary | ICD-10-CM | POA: Diagnosis not present

## 2018-07-13 DIAGNOSIS — M9902 Segmental and somatic dysfunction of thoracic region: Secondary | ICD-10-CM | POA: Diagnosis not present

## 2018-07-13 DIAGNOSIS — M5136 Other intervertebral disc degeneration, lumbar region: Secondary | ICD-10-CM | POA: Diagnosis not present

## 2018-07-13 DIAGNOSIS — M62830 Muscle spasm of back: Secondary | ICD-10-CM | POA: Diagnosis not present

## 2018-07-30 DIAGNOSIS — Z8551 Personal history of malignant neoplasm of bladder: Secondary | ICD-10-CM | POA: Diagnosis not present

## 2018-07-30 DIAGNOSIS — Z7901 Long term (current) use of anticoagulants: Secondary | ICD-10-CM | POA: Diagnosis not present

## 2018-07-30 DIAGNOSIS — Z79899 Other long term (current) drug therapy: Secondary | ICD-10-CM | POA: Diagnosis not present

## 2018-07-30 DIAGNOSIS — Z8546 Personal history of malignant neoplasm of prostate: Secondary | ICD-10-CM | POA: Diagnosis not present

## 2018-07-30 DIAGNOSIS — Z87891 Personal history of nicotine dependence: Secondary | ICD-10-CM | POA: Diagnosis not present

## 2018-07-30 DIAGNOSIS — C785 Secondary malignant neoplasm of large intestine and rectum: Secondary | ICD-10-CM | POA: Diagnosis not present

## 2018-07-30 DIAGNOSIS — Z87440 Personal history of urinary (tract) infections: Secondary | ICD-10-CM | POA: Diagnosis not present

## 2018-07-30 DIAGNOSIS — Z6823 Body mass index (BMI) 23.0-23.9, adult: Secondary | ICD-10-CM | POA: Diagnosis not present

## 2018-07-30 DIAGNOSIS — R001 Bradycardia, unspecified: Secondary | ICD-10-CM | POA: Diagnosis not present

## 2018-07-30 DIAGNOSIS — I083 Combined rheumatic disorders of mitral, aortic and tricuspid valves: Secondary | ICD-10-CM | POA: Diagnosis not present

## 2018-07-30 DIAGNOSIS — I480 Paroxysmal atrial fibrillation: Secondary | ICD-10-CM | POA: Diagnosis not present

## 2018-07-30 DIAGNOSIS — I4891 Unspecified atrial fibrillation: Secondary | ICD-10-CM | POA: Diagnosis not present

## 2018-08-05 ENCOUNTER — Other Ambulatory Visit: Payer: Self-pay | Admitting: Family Medicine

## 2018-08-10 DIAGNOSIS — M9902 Segmental and somatic dysfunction of thoracic region: Secondary | ICD-10-CM | POA: Diagnosis not present

## 2018-08-10 DIAGNOSIS — M62830 Muscle spasm of back: Secondary | ICD-10-CM | POA: Diagnosis not present

## 2018-08-10 DIAGNOSIS — M9903 Segmental and somatic dysfunction of lumbar region: Secondary | ICD-10-CM | POA: Diagnosis not present

## 2018-08-10 DIAGNOSIS — M5136 Other intervertebral disc degeneration, lumbar region: Secondary | ICD-10-CM | POA: Diagnosis not present

## 2018-08-21 ENCOUNTER — Other Ambulatory Visit: Payer: Self-pay | Admitting: Family Medicine

## 2018-09-09 ENCOUNTER — Ambulatory Visit (INDEPENDENT_AMBULATORY_CARE_PROVIDER_SITE_OTHER): Payer: Medicare HMO | Admitting: *Deleted

## 2018-09-09 DIAGNOSIS — Z23 Encounter for immunization: Secondary | ICD-10-CM | POA: Diagnosis not present

## 2018-09-13 DIAGNOSIS — Z433 Encounter for attention to colostomy: Secondary | ICD-10-CM | POA: Diagnosis not present

## 2018-09-14 DIAGNOSIS — M9902 Segmental and somatic dysfunction of thoracic region: Secondary | ICD-10-CM | POA: Diagnosis not present

## 2018-09-14 DIAGNOSIS — M9903 Segmental and somatic dysfunction of lumbar region: Secondary | ICD-10-CM | POA: Diagnosis not present

## 2018-09-14 DIAGNOSIS — M62830 Muscle spasm of back: Secondary | ICD-10-CM | POA: Diagnosis not present

## 2018-09-14 DIAGNOSIS — M5136 Other intervertebral disc degeneration, lumbar region: Secondary | ICD-10-CM | POA: Diagnosis not present

## 2018-09-14 DIAGNOSIS — Z433 Encounter for attention to colostomy: Secondary | ICD-10-CM | POA: Diagnosis not present

## 2018-10-29 DIAGNOSIS — C679 Malignant neoplasm of bladder, unspecified: Secondary | ICD-10-CM | POA: Diagnosis not present

## 2018-11-03 DIAGNOSIS — C7989 Secondary malignant neoplasm of other specified sites: Secondary | ICD-10-CM | POA: Diagnosis not present

## 2018-11-03 DIAGNOSIS — E0590 Thyrotoxicosis, unspecified without thyrotoxic crisis or storm: Secondary | ICD-10-CM | POA: Diagnosis not present

## 2018-11-03 DIAGNOSIS — Z933 Colostomy status: Secondary | ICD-10-CM | POA: Diagnosis not present

## 2018-11-03 DIAGNOSIS — I4891 Unspecified atrial fibrillation: Secondary | ICD-10-CM | POA: Diagnosis not present

## 2018-11-03 DIAGNOSIS — Z923 Personal history of irradiation: Secondary | ICD-10-CM | POA: Diagnosis not present

## 2018-11-03 DIAGNOSIS — Z8546 Personal history of malignant neoplasm of prostate: Secondary | ICD-10-CM | POA: Diagnosis not present

## 2018-11-03 DIAGNOSIS — Z87891 Personal history of nicotine dependence: Secondary | ICD-10-CM | POA: Diagnosis not present

## 2018-11-03 DIAGNOSIS — Z79899 Other long term (current) drug therapy: Secondary | ICD-10-CM | POA: Diagnosis not present

## 2018-11-03 DIAGNOSIS — Z8551 Personal history of malignant neoplasm of bladder: Secondary | ICD-10-CM | POA: Diagnosis not present

## 2018-11-03 DIAGNOSIS — Z5112 Encounter for antineoplastic immunotherapy: Secondary | ICD-10-CM | POA: Diagnosis not present

## 2018-11-16 DIAGNOSIS — M9902 Segmental and somatic dysfunction of thoracic region: Secondary | ICD-10-CM | POA: Diagnosis not present

## 2018-11-16 DIAGNOSIS — M5136 Other intervertebral disc degeneration, lumbar region: Secondary | ICD-10-CM | POA: Diagnosis not present

## 2018-11-16 DIAGNOSIS — M9903 Segmental and somatic dysfunction of lumbar region: Secondary | ICD-10-CM | POA: Diagnosis not present

## 2018-11-16 DIAGNOSIS — M62830 Muscle spasm of back: Secondary | ICD-10-CM | POA: Diagnosis not present

## 2018-12-12 ENCOUNTER — Other Ambulatory Visit: Payer: Self-pay | Admitting: Family Medicine

## 2018-12-13 NOTE — Telephone Encounter (Signed)
Last filled 07/2018... last OV 05/2018...Marland Kitchen please advise

## 2018-12-14 NOTE — Telephone Encounter (Signed)
Sent. Thanks.   

## 2018-12-17 DIAGNOSIS — Z433 Encounter for attention to colostomy: Secondary | ICD-10-CM | POA: Diagnosis not present

## 2018-12-21 DIAGNOSIS — Z433 Encounter for attention to colostomy: Secondary | ICD-10-CM | POA: Diagnosis not present

## 2019-02-22 DIAGNOSIS — Z433 Encounter for attention to colostomy: Secondary | ICD-10-CM | POA: Diagnosis not present

## 2019-03-21 DIAGNOSIS — Z433 Encounter for attention to colostomy: Secondary | ICD-10-CM | POA: Diagnosis not present

## 2019-03-22 DIAGNOSIS — Z433 Encounter for attention to colostomy: Secondary | ICD-10-CM | POA: Diagnosis not present

## 2019-04-16 ENCOUNTER — Other Ambulatory Visit: Payer: Self-pay | Admitting: Family Medicine

## 2019-04-18 ENCOUNTER — Other Ambulatory Visit: Payer: Self-pay | Admitting: Family Medicine

## 2019-04-18 DIAGNOSIS — I480 Paroxysmal atrial fibrillation: Secondary | ICD-10-CM

## 2019-04-18 NOTE — Telephone Encounter (Signed)
LOV 05/31/2018. No future appointments scheduled. Needs OV?

## 2019-04-18 NOTE — Telephone Encounter (Signed)
Sent. Thanks.  Please schedule AWV when possible. Thanks.

## 2019-04-18 NOTE — Telephone Encounter (Signed)
LOV was on 05/31/18, no future appointments.

## 2019-04-18 NOTE — Telephone Encounter (Signed)
Schedule AMW when possible.  Sent. Thanks.

## 2019-04-20 ENCOUNTER — Encounter: Payer: Self-pay | Admitting: Family Medicine

## 2019-04-20 NOTE — Telephone Encounter (Signed)
Tried calling patient keep getting busy signal when I call. Mailed letter for pt to call us and schedule.

## 2019-05-11 DIAGNOSIS — Z87891 Personal history of nicotine dependence: Secondary | ICD-10-CM | POA: Diagnosis not present

## 2019-05-11 DIAGNOSIS — Z08 Encounter for follow-up examination after completed treatment for malignant neoplasm: Secondary | ICD-10-CM | POA: Diagnosis not present

## 2019-05-11 DIAGNOSIS — I4891 Unspecified atrial fibrillation: Secondary | ICD-10-CM | POA: Diagnosis not present

## 2019-05-11 DIAGNOSIS — Z8546 Personal history of malignant neoplasm of prostate: Secondary | ICD-10-CM | POA: Diagnosis not present

## 2019-05-11 DIAGNOSIS — E032 Hypothyroidism due to medicaments and other exogenous substances: Secondary | ICD-10-CM | POA: Diagnosis not present

## 2019-05-11 DIAGNOSIS — C679 Malignant neoplasm of bladder, unspecified: Secondary | ICD-10-CM | POA: Diagnosis not present

## 2019-05-11 DIAGNOSIS — Z8551 Personal history of malignant neoplasm of bladder: Secondary | ICD-10-CM | POA: Diagnosis not present

## 2019-06-14 DIAGNOSIS — Z433 Encounter for attention to colostomy: Secondary | ICD-10-CM | POA: Diagnosis not present

## 2019-06-20 ENCOUNTER — Other Ambulatory Visit: Payer: Self-pay | Admitting: Family Medicine

## 2019-06-20 ENCOUNTER — Other Ambulatory Visit (INDEPENDENT_AMBULATORY_CARE_PROVIDER_SITE_OTHER): Payer: Medicare HMO

## 2019-06-20 DIAGNOSIS — I10 Essential (primary) hypertension: Secondary | ICD-10-CM

## 2019-06-20 DIAGNOSIS — D649 Anemia, unspecified: Secondary | ICD-10-CM

## 2019-06-20 DIAGNOSIS — Z8679 Personal history of other diseases of the circulatory system: Secondary | ICD-10-CM

## 2019-06-20 LAB — COMPREHENSIVE METABOLIC PANEL
ALT: 21 U/L (ref 0–53)
AST: 19 U/L (ref 0–37)
Albumin: 3.9 g/dL (ref 3.5–5.2)
Alkaline Phosphatase: 57 U/L (ref 39–117)
BUN: 21 mg/dL (ref 6–23)
CO2: 30 mEq/L (ref 19–32)
Calcium: 9.2 mg/dL (ref 8.4–10.5)
Chloride: 106 mEq/L (ref 96–112)
Creatinine, Ser: 1.21 mg/dL (ref 0.40–1.50)
GFR: 56.97 mL/min — ABNORMAL LOW (ref >60.00)
Glucose, Bld: 101 mg/dL — ABNORMAL HIGH (ref 70–99)
Potassium: 4.2 mEq/L (ref 3.5–5.1)
Sodium: 142 mEq/L (ref 135–145)
Total Bilirubin: 0.4 mg/dL (ref 0.2–1.2)
Total Protein: 6.3 g/dL (ref 6.0–8.3)

## 2019-06-20 LAB — CBC WITH DIFFERENTIAL/PLATELET
Basophils Absolute: 0.1 10*3/uL (ref 0.0–0.1)
Basophils Relative: 1 % (ref 0.0–3.0)
Eosinophils Absolute: 0.3 10*3/uL (ref 0.0–0.7)
Eosinophils Relative: 4.2 % (ref 0.0–5.0)
HCT: 39.3 % (ref 39.0–52.0)
Hemoglobin: 13.1 g/dL (ref 13.0–17.0)
Lymphocytes Relative: 19.6 % (ref 12.0–46.0)
Lymphs Abs: 1.4 10*3/uL (ref 0.7–4.0)
MCHC: 33.3 g/dL (ref 30.0–36.0)
MCV: 95.8 fl (ref 78.0–100.0)
Monocytes Absolute: 0.6 10*3/uL (ref 0.1–1.0)
Monocytes Relative: 8.9 % (ref 3.0–12.0)
Neutro Abs: 4.6 10*3/uL (ref 1.4–7.7)
Neutrophils Relative %: 66.3 % (ref 43.0–77.0)
Platelets: 230 10*3/uL (ref 150.0–400.0)
RBC: 4.1 Mil/uL — ABNORMAL LOW (ref 4.22–5.81)
RDW: 14.4 % (ref 11.5–15.5)
WBC: 7 10*3/uL (ref 4.0–10.5)

## 2019-06-20 LAB — LIPID PANEL
Cholesterol: 178 mg/dL (ref 0–200)
HDL: 55.2 mg/dL (ref >39.00)
LDL Cholesterol: 102 mg/dL — ABNORMAL HIGH (ref 0–99)
NonHDL: 123.23
Total CHOL/HDL Ratio: 3
Triglycerides: 105 mg/dL (ref 0.0–149.0)
VLDL: 21 mg/dL (ref 0.0–40.0)

## 2019-06-20 LAB — IBC PANEL
Iron: 94 ug/dL (ref 42–165)
Saturation Ratios: 36.3 % (ref 20.0–50.0)
Transferrin: 185 mg/dL — ABNORMAL LOW (ref 212.0–360.0)

## 2019-06-20 LAB — TSH: TSH: 0.81 u[IU]/mL (ref 0.35–4.50)

## 2019-06-21 DIAGNOSIS — Z433 Encounter for attention to colostomy: Secondary | ICD-10-CM | POA: Diagnosis not present

## 2019-06-27 ENCOUNTER — Ambulatory Visit (INDEPENDENT_AMBULATORY_CARE_PROVIDER_SITE_OTHER): Payer: Medicare HMO | Admitting: Family Medicine

## 2019-06-27 ENCOUNTER — Encounter: Payer: Self-pay | Admitting: Family Medicine

## 2019-06-27 ENCOUNTER — Other Ambulatory Visit: Payer: Self-pay

## 2019-06-27 VITALS — BP 132/58 | HR 53 | Temp 97.6°F | Ht 70.0 in | Wt 160.7 lb

## 2019-06-27 DIAGNOSIS — Z23 Encounter for immunization: Secondary | ICD-10-CM | POA: Diagnosis not present

## 2019-06-27 DIAGNOSIS — Z933 Colostomy status: Secondary | ICD-10-CM | POA: Diagnosis not present

## 2019-06-27 DIAGNOSIS — Z0000 Encounter for general adult medical examination without abnormal findings: Secondary | ICD-10-CM | POA: Diagnosis not present

## 2019-06-27 DIAGNOSIS — Z436 Encounter for attention to other artificial openings of urinary tract: Secondary | ICD-10-CM

## 2019-06-27 DIAGNOSIS — C679 Malignant neoplasm of bladder, unspecified: Secondary | ICD-10-CM

## 2019-06-27 DIAGNOSIS — Z8679 Personal history of other diseases of the circulatory system: Secondary | ICD-10-CM

## 2019-06-27 DIAGNOSIS — Z7189 Other specified counseling: Secondary | ICD-10-CM

## 2019-06-27 NOTE — Progress Notes (Signed)
I have personally reviewed the Medicare Annual Wellness questionnaire and have noted 1. The patient's medical and social history 2. Their use of alcohol, tobacco or illicit drugs 3. Their current medications and supplements 4. The patient's functional ability including ADL's, fall risks, home safety risks and hearing or visual             impairment. 5. Diet and physical activities 6. Evidence for depression or mood disorders  The patients weight, height, BMI have been recorded in the chart and visual acuity is per eye clinic.  I have made referrals, counseling and provided education to the patient based review of the above and I have provided the pt with a written personalized care plan for preventive services.  Provider list updated- see scanned forms.  Routine anticipatory guidance given to patient.  See health maintenance. The possibility exists that previously documented standard health maintenance information may have been brought forward from a previous encounter into this note.  If needed, that same information has been updated to reflect the current situation based on today's encounter.    Flu 2020 Shingles discussed with patient PNA 23 up-to-date Tetanus discussed with patient Colon cancer screening not applicable given his age. Prostate cancer screening not applicable given his age.   Living will d/w pt.  Daughter Izora Gala designated if patient were incapacitated.   Cognitive function addressed- see scanned forms- and if abnormal then additional documentation follows.   S/p bladder cancer tx.  He is dealing with double colostomy.  Ct 04/2019 No evidence of disease recurrence or metastatic disease within the abdomen or pelvis.   Plan: --HOLD treatment at this time in favor of surveillance  -- Follow up in 3-4 months  PAF.  On anticoagulation.  No CP, no bleeding.  Not SOB.  Still playing golf.  Compliant with meds.  No heart racing.   Labs d/w pt.   PMH and SH reviewed  Meds,  vitals, and allergies reviewed.   ROS: Per HPI.  Unless specifically indicated otherwise in HPI, the patient denies:  General: fever. Eyes: acute vision changes ENT: sore throat Cardiovascular: chest pain Respiratory: SOB GI: vomiting GU: dysuria Musculoskeletal: acute back pain Derm: acute rash Neuro: acute motor dysfunction Psych: worsening mood Endocrine: polydipsia Heme: bleeding Allergy: hayfever  GEN: nad, alert and oriented HEENT: ncat NECK: supple w/o LA CV: rrr. PULM: ctab, no inc wob ABD: soft, +bs EXT: no edema SKIN: no acute rash Urostomy and colostomy noted.   Health Maintenance  Topic Date Due  . PNA vac Low Risk Adult (2 of 2 - PCV13) 06/30/2012  . TETANUS/TDAP  08/12/2026 (Originally 04/22/1951)  . INFLUENZA VACCINE  Completed

## 2019-06-27 NOTE — Patient Instructions (Addendum)
I expect cardiology to contact you about follow up this winter.  If not, and if you need help getting an appointment, then let me know.    [facility] [place]  [address]  [place], [place] [address]  [phone]    Check with your insurance to see if they will cover the shingrix shot. Thanks for getting a flu shot.   Don't change your meds for now.  Your labs look good.  Update me as needed.  Take care.  Glad to see you.

## 2019-06-29 DIAGNOSIS — Z0000 Encounter for general adult medical examination without abnormal findings: Secondary | ICD-10-CM | POA: Insufficient documentation

## 2019-06-29 NOTE — Assessment & Plan Note (Signed)
Per urology.  I will defer.  He agrees.

## 2019-06-29 NOTE — Assessment & Plan Note (Signed)
Living will d/w pt.  Daughter Izora Gala designated if patient were incapacitated.

## 2019-06-29 NOTE — Assessment & Plan Note (Signed)
On anticoagulation.  No CP, no bleeding.  Not SOB.  Still playing golf.  Compliant with meds.  No heart racing.  Continue as is.  He agrees.

## 2019-06-29 NOTE — Assessment & Plan Note (Signed)
Flu 2020 Shingles discussed with patient PNA 23 up-to-date Tetanus discussed with patient Colon cancer screening not applicable given his age. Prostate cancer screening not applicable given his age.   Living will d/w pt.  Daughter Izora Gala designated if patient were incapacitated.   Cognitive function addressed- see scanned forms- and if abnormal then additional documentation follows.

## 2019-06-29 NOTE — Assessment & Plan Note (Signed)
History of, per urology.  I will defer.  He agrees.

## 2019-06-29 NOTE — Assessment & Plan Note (Signed)
Routine care discussed with patient.  Update me as needed.

## 2019-08-20 ENCOUNTER — Other Ambulatory Visit: Payer: Self-pay | Admitting: Family Medicine

## 2019-08-22 DIAGNOSIS — C679 Malignant neoplasm of bladder, unspecified: Secondary | ICD-10-CM | POA: Diagnosis not present

## 2019-08-24 DIAGNOSIS — I4891 Unspecified atrial fibrillation: Secondary | ICD-10-CM | POA: Diagnosis not present

## 2019-08-24 DIAGNOSIS — Z08 Encounter for follow-up examination after completed treatment for malignant neoplasm: Secondary | ICD-10-CM | POA: Diagnosis not present

## 2019-08-24 DIAGNOSIS — E0590 Thyrotoxicosis, unspecified without thyrotoxic crisis or storm: Secondary | ICD-10-CM | POA: Diagnosis not present

## 2019-08-24 DIAGNOSIS — Z8546 Personal history of malignant neoplasm of prostate: Secondary | ICD-10-CM | POA: Diagnosis not present

## 2019-09-07 DIAGNOSIS — Z433 Encounter for attention to colostomy: Secondary | ICD-10-CM | POA: Diagnosis not present

## 2019-09-22 DIAGNOSIS — Z433 Encounter for attention to colostomy: Secondary | ICD-10-CM | POA: Diagnosis not present

## 2019-12-24 ENCOUNTER — Other Ambulatory Visit: Payer: Self-pay | Admitting: Family Medicine

## 2020-03-21 ENCOUNTER — Telehealth: Payer: Self-pay

## 2020-03-21 NOTE — Telephone Encounter (Signed)
[facility] left v/m that an ostomy supply order form was faxed to Dr Damita Dunnings on 03/21/20 and request cb if not received and if received please fill out ASAP and fax back to # on form. FYI to Dr Damita Dunnings and Terri Skains CMA.

## 2020-03-21 NOTE — Telephone Encounter (Signed)
I am out of the clinic today.  Please make sure this form came in and I will work on it as soon as I can when I am back in clinic.  Thanks.

## 2020-03-23 NOTE — Telephone Encounter (Signed)
Form signed by PCP and given up front to fax.

## 2020-05-07 ENCOUNTER — Other Ambulatory Visit: Payer: Self-pay | Admitting: Family Medicine

## 2020-05-23 ENCOUNTER — Telehealth: Payer: Self-pay | Admitting: Family Medicine

## 2020-05-23 NOTE — Telephone Encounter (Signed)
Patient came in the office stating that the former place he would get the osotomy supplies is no longer in business, states he has switched to [facility]. He expressed that they have tried faxing papers over to [place] several times but have yet to hear any thing, asked if someone could call them at [phone].

## 2020-05-24 NOTE — Telephone Encounter (Signed)
Pt said they have equipment at home and they won't pick it up unless doctor will approve it. They are charging him everyday and he needs a call. [phone].

## 2020-05-25 ENCOUNTER — Telehealth: Payer: Self-pay | Admitting: *Deleted

## 2020-05-25 NOTE — Telephone Encounter (Signed)
Form done, please fax.  Thanks.

## 2020-05-25 NOTE — Telephone Encounter (Signed)
Patient states that he needs his form for the ostomy supplies done right away as he is running out of supplies.

## 2020-05-25 NOTE — Telephone Encounter (Signed)
Patient came into office. I advised him that the form for [facility] has been given to Dr. Damita Dunnings to complete. He is requesting a call back from [place] by the end of day today to discuss his concerns about oxygen tanks. He states [facility] will not come pick up the tanks without approval from Dr. Damita Dunnings. He has to pay every day that he has them and he wants them to be picked up so he doesn't have to pay anymore.

## 2020-05-25 NOTE — Telephone Encounter (Addendum)
Spoke with patient's husband and advised him that the DC order had been faxed numerous times and I have called numerous times.  Spoke to the [place] again today who says that none of my faxes have been received even though I have confirmations.  Fax was verified again today and faxed again.

## 2020-05-25 NOTE — Telephone Encounter (Signed)
Faxed order

## 2020-06-07 ENCOUNTER — Encounter: Payer: Self-pay | Admitting: Family Medicine

## 2020-06-07 ENCOUNTER — Other Ambulatory Visit: Payer: Self-pay

## 2020-06-07 ENCOUNTER — Ambulatory Visit (INDEPENDENT_AMBULATORY_CARE_PROVIDER_SITE_OTHER): Payer: Medicare HMO | Admitting: Family Medicine

## 2020-06-07 VITALS — BP 150/60 | HR 55 | Temp 96.5°F | Ht 70.0 in | Wt 152.0 lb

## 2020-06-07 DIAGNOSIS — I480 Paroxysmal atrial fibrillation: Secondary | ICD-10-CM | POA: Diagnosis not present

## 2020-06-07 DIAGNOSIS — Z933 Colostomy status: Secondary | ICD-10-CM | POA: Diagnosis not present

## 2020-06-07 DIAGNOSIS — D649 Anemia, unspecified: Secondary | ICD-10-CM | POA: Diagnosis not present

## 2020-06-07 DIAGNOSIS — C679 Malignant neoplasm of bladder, unspecified: Secondary | ICD-10-CM | POA: Diagnosis not present

## 2020-06-07 NOTE — Progress Notes (Signed)
This visit occurred during the SARS-CoV-2 public health emergency.  Safety protocols were in place, including screening questions prior to the visit, additional usage of staff PPE, and extensive cleaning of exam room while observing appropriate contact time as indicated for disinfecting solutions.  He is still on eliquis but off his other meds.    He is off amiodarone, amlodipine, iron, metoprolol, and prilosec.  Has been off for about 2-3 weeks.  He had some recent heart racing noted, felt weak at the time, 2 episodes recently while off prev meds.  No CP.  Not SOB at rest, only noted with heart racing.  Not lightheaded on standing.  Prev episodes lasted about 1 hour.    He has [facility] f/u pending.  Most recent imaging with no evidence recurrent or metastatic disease the abdomen or pelvis.  He has urostomy and colostomy at baseline  Weight had fluctuated, 152 today.  Prev 140-160.    History of anemia noted.  See follow-up labs.  Meds, vitals, and allergies reviewed.   ROS: Per HPI unless specifically indicated in ROS section   GEN: nad, alert and oriented HEENT:ncat NECK: supple w/o LA CV: rrr PULM: ctab, no inc wob ABD: soft, +bs, with urostomy and colostomy in place. EXT: no edema SKIN: no acute rash.

## 2020-06-07 NOTE — Patient Instructions (Addendum)
Would stop amlodipine for now.  Restart amiodarone.  Use metoprolol if you notice heart racing.    I would restart omeprazole.    Go to the lab on the way out.   If you have mychart we'll likely use that to update you.     If recurrent symptoms, then update us/seek care/dial 911 if needed.   We'll update cardiology/[facility].   Take care.  Glad to see you.

## 2020-06-08 LAB — COMPREHENSIVE METABOLIC PANEL
ALT: 14 U/L (ref 0–53)
AST: 16 U/L (ref 0–37)
Albumin: 4.2 g/dL (ref 3.5–5.2)
Alkaline Phosphatase: 54 U/L (ref 39–117)
BUN: 32 mg/dL — ABNORMAL HIGH (ref 6–23)
CO2: 26 mEq/L (ref 19–32)
Calcium: 9.3 mg/dL (ref 8.4–10.5)
Chloride: 104 mEq/L (ref 96–112)
Creatinine, Ser: 1.46 mg/dL (ref 0.40–1.50)
GFR: 45.77 mL/min — ABNORMAL LOW (ref >60.00)
Glucose, Bld: 91 mg/dL (ref 70–99)
Potassium: 4.4 mEq/L (ref 3.5–5.1)
Sodium: 139 mEq/L (ref 135–145)
Total Bilirubin: 0.4 mg/dL (ref 0.2–1.2)
Total Protein: 6.7 g/dL (ref 6.0–8.3)

## 2020-06-08 LAB — CBC WITH DIFFERENTIAL/PLATELET
Basophils Absolute: 0 10*3/uL (ref 0.0–0.1)
Basophils Relative: 0.5 % (ref 0.0–3.0)
Eosinophils Absolute: 0.4 10*3/uL (ref 0.0–0.7)
Eosinophils Relative: 5.1 % — ABNORMAL HIGH (ref 0.0–5.0)
HCT: 36.5 % — ABNORMAL LOW (ref 39.0–52.0)
Hemoglobin: 12.3 g/dL — ABNORMAL LOW (ref 13.0–17.0)
Lymphocytes Relative: 19.9 % (ref 12.0–46.0)
Lymphs Abs: 1.5 10*3/uL (ref 0.7–4.0)
MCHC: 33.8 g/dL (ref 30.0–36.0)
MCV: 95.1 fl (ref 78.0–100.0)
Monocytes Absolute: 0.8 10*3/uL (ref 0.1–1.0)
Monocytes Relative: 10.9 % (ref 3.0–12.0)
Neutro Abs: 4.7 10*3/uL (ref 1.4–7.7)
Neutrophils Relative %: 63.6 % (ref 43.0–77.0)
Platelets: 233 10*3/uL (ref 150.0–400.0)
RBC: 3.84 Mil/uL — ABNORMAL LOW (ref 4.22–5.81)
RDW: 13.8 % (ref 11.5–15.5)
WBC: 7.4 10*3/uL (ref 4.0–10.5)

## 2020-06-08 LAB — IRON: Iron: 63 ug/dL (ref 42–165)

## 2020-06-08 LAB — TSH: TSH: 0.78 u[IU]/mL (ref 0.35–4.50)

## 2020-06-11 NOTE — Assessment & Plan Note (Signed)
Per outside clinic.  I will defer.  He agrees.

## 2020-06-11 NOTE — Assessment & Plan Note (Signed)
Presumably having episodes of tachycardia off amiodarone.  Discussed options.  Would stop amlodipine for now.  Restart amiodarone.  Use metoprolol if noticing heart racing.    If recurrent symptoms, then update us/seek care/dial 911 if needed.   We'll update cardiology/[facility].   At least 30 minutes were devoted to patient care in this encounter (this can potentially include time spent reviewing the patient's file/history, interviewing and examining the patient, counseling/reviewing plan with patient, ordering referrals, ordering tests, reviewing relevant laboratory or x-ray data, and documenting the encounter).

## 2020-06-11 NOTE — Assessment & Plan Note (Signed)
Restart omeprazole.  See notes on labs.

## 2020-07-09 ENCOUNTER — Encounter: Payer: Self-pay | Admitting: Family Medicine

## 2020-09-15 ENCOUNTER — Other Ambulatory Visit: Payer: Self-pay | Admitting: Family Medicine

## 2020-11-02 ENCOUNTER — Ambulatory Visit: Payer: Medicare HMO | Admitting: Family Medicine

## 2020-11-02 ENCOUNTER — Encounter: Payer: Self-pay | Admitting: Family Medicine

## 2020-11-02 ENCOUNTER — Other Ambulatory Visit: Payer: Self-pay

## 2020-11-02 VITALS — BP 100/64 | HR 58 | Temp 97.5°F | Ht 70.0 in | Wt 151.0 lb

## 2020-11-02 DIAGNOSIS — G2581 Restless legs syndrome: Secondary | ICD-10-CM | POA: Diagnosis not present

## 2020-11-02 LAB — COMPREHENSIVE METABOLIC PANEL
ALT: 9 U/L (ref 0–53)
AST: 11 U/L (ref 0–37)
Albumin: 3.5 g/dL (ref 3.5–5.2)
Alkaline Phosphatase: 52 U/L (ref 39–117)
BUN: 25 mg/dL — ABNORMAL HIGH (ref 6–23)
CO2: 30 mEq/L (ref 19–32)
Calcium: 9.6 mg/dL (ref 8.4–10.5)
Chloride: 104 mEq/L (ref 96–112)
Creatinine, Ser: 1.5 mg/dL (ref 0.40–1.50)
GFR: 41.89 mL/min — ABNORMAL LOW (ref >60.00)
Glucose, Bld: 98 mg/dL (ref 70–99)
Potassium: 4.6 mEq/L (ref 3.5–5.1)
Sodium: 140 mEq/L (ref 135–145)
Total Bilirubin: 0.4 mg/dL (ref 0.2–1.2)
Total Protein: 6.4 g/dL (ref 6.0–8.3)

## 2020-11-02 LAB — CBC WITH DIFFERENTIAL/PLATELET
Basophils Absolute: 0 10*3/uL (ref 0.0–0.1)
Basophils Relative: 0.4 % (ref 0.0–3.0)
Eosinophils Absolute: 0.2 10*3/uL (ref 0.0–0.7)
Eosinophils Relative: 2.7 % (ref 0.0–5.0)
HCT: 33.4 % — ABNORMAL LOW (ref 39.0–52.0)
Hemoglobin: 11.3 g/dL — ABNORMAL LOW (ref 13.0–17.0)
Lymphocytes Relative: 17.1 % (ref 12.0–46.0)
Lymphs Abs: 1.4 10*3/uL (ref 0.7–4.0)
MCHC: 33.8 g/dL (ref 30.0–36.0)
MCV: 95.6 fl (ref 78.0–100.0)
Monocytes Absolute: 0.8 10*3/uL (ref 0.1–1.0)
Monocytes Relative: 9.7 % (ref 3.0–12.0)
Neutro Abs: 5.6 10*3/uL (ref 1.4–7.7)
Neutrophils Relative %: 70.1 % (ref 43.0–77.0)
Platelets: 362 10*3/uL (ref 150.0–400.0)
RBC: 3.49 Mil/uL — ABNORMAL LOW (ref 4.22–5.81)
RDW: 13.3 % (ref 11.5–15.5)
WBC: 7.9 10*3/uL (ref 4.0–10.5)

## 2020-11-02 LAB — TSH: TSH: 0.35 u[IU]/mL (ref 0.35–4.50)

## 2020-11-02 LAB — IRON: Iron: 68 ug/dL (ref 42–165)

## 2020-11-02 NOTE — Patient Instructions (Addendum)
Don't change your meds for now.   Go to the lab on the way out.   If you have mychart we'll likely use that to update you.    Take care.  Glad to see you. If you have more trouble then let me know.

## 2020-11-02 NOTE — Progress Notes (Signed)
This visit occurred during the SARS-CoV-2 public health emergency.  Safety protocols were in place, including screening questions prior to the visit, additional usage of staff PPE, and extensive cleaning of exam room while observing appropriate contact time as indicated for disinfecting solutions.  Trouble sleeping, h/o RLS per patient report.  Sx were worse last week, at night.  Still on baseline meds.  Not on statin.  Thighs were sore after the fact.  He has gradually gotten better in the last few days.  No clear triggers for last weeks events.    We talked about stressors related to his wife dying last year.  He is trying to adjust to the changes.    Current Outpatient Medications on File Prior to Visit  Medication Sig Dispense Refill  . amiodarone (PACERONE) 200 MG tablet TAKE 1 TABLET BY MOUTH EVERY DAY 90 tablet 1  . [facility] SENNA 8.6 MG tablet TAKE 2 TABLETS (17.2 MG TOTAL) BY MOUTH AT BEDTIME AS NEEDED FOR CONSTIPATION. 60 tablet 3  . ELIQUIS 5 MG TABS tablet TAKE 1 TABLET BY MOUTH TWO  TIMES DAILY 180 tablet 0  . metoprolol tartrate (LOPRESSOR) 25 MG tablet Take 0.5 tablets (12.5 mg total) by mouth 2 (two) times daily as needed (for heart racing.). 60 tablet 3  . omeprazole (PRILOSEC) 20 MG capsule Take 20 mg by mouth daily.      No current facility-administered medications on file prior to visit.   GEN: nad, alert and oriented HEENT: ncat NECK: supple w/o LA CV: rrr. PULM: ctab, no inc wob ABD: soft, +bs, ostomy in place. EXT: no edema SKIN: no acute rash

## 2020-11-04 ENCOUNTER — Other Ambulatory Visit: Payer: Self-pay | Admitting: Family Medicine

## 2020-11-04 DIAGNOSIS — G2581 Restless legs syndrome: Secondary | ICD-10-CM | POA: Insufficient documentation

## 2020-11-04 MED ORDER — FERROUS SULFATE 325 (65 FE) MG PO TABS: 325.0000 mg | ORAL_TABLET | ORAL | 1 refills | Status: DC

## 2020-11-04 NOTE — Assessment & Plan Note (Signed)
No change in meds at this point.  Check labs.  Rationale discussed with patient.  If he has a significant laboratory abnormality we can address that.  If he does not, then we can consider options for primary RLS treatment.  He agrees with plan.  Okay for outpatient follow-up.

## 2021-01-15 ENCOUNTER — Other Ambulatory Visit: Payer: Self-pay | Admitting: Family Medicine

## 2021-03-29 ENCOUNTER — Other Ambulatory Visit: Payer: Self-pay

## 2021-03-29 ENCOUNTER — Encounter: Payer: Self-pay | Admitting: Family Medicine

## 2021-03-29 ENCOUNTER — Ambulatory Visit (INDEPENDENT_AMBULATORY_CARE_PROVIDER_SITE_OTHER): Payer: Medicare HMO | Admitting: Family Medicine

## 2021-03-29 VITALS — BP 118/72 | HR 90 | Temp 98.2°F | Wt 155.4 lb

## 2021-03-29 DIAGNOSIS — R000 Tachycardia, unspecified: Secondary | ICD-10-CM | POA: Diagnosis not present

## 2021-03-29 DIAGNOSIS — I480 Paroxysmal atrial fibrillation: Secondary | ICD-10-CM

## 2021-03-29 LAB — TSH: TSH: 0.41 u[IU]/mL (ref 0.35–5.50)

## 2021-03-29 LAB — CBC WITH DIFFERENTIAL/PLATELET
Basophils Absolute: 0.1 10*3/uL (ref 0.0–0.1)
Basophils Relative: 0.7 % (ref 0.0–3.0)
Eosinophils Absolute: 0.3 10*3/uL (ref 0.0–0.7)
Eosinophils Relative: 4.5 % (ref 0.0–5.0)
HCT: 38.5 % — ABNORMAL LOW (ref 39.0–52.0)
Hemoglobin: 13.1 g/dL (ref 13.0–17.0)
Lymphocytes Relative: 17.5 % (ref 12.0–46.0)
Lymphs Abs: 1.4 10*3/uL (ref 0.7–4.0)
MCHC: 34 g/dL (ref 30.0–36.0)
MCV: 95.1 fl (ref 78.0–100.0)
Monocytes Absolute: 0.7 10*3/uL (ref 0.1–1.0)
Monocytes Relative: 9.2 % (ref 3.0–12.0)
Neutro Abs: 5.3 10*3/uL (ref 1.4–7.7)
Neutrophils Relative %: 68.1 % (ref 43.0–77.0)
Platelets: 226 10*3/uL (ref 150.0–400.0)
RBC: 4.05 Mil/uL — ABNORMAL LOW (ref 4.22–5.81)
RDW: 13.7 % (ref 11.5–15.5)
WBC: 7.8 10*3/uL (ref 4.0–10.5)

## 2021-03-29 LAB — COMPREHENSIVE METABOLIC PANEL
ALT: 14 U/L (ref 0–53)
AST: 17 U/L (ref 0–37)
Albumin: 4.1 g/dL (ref 3.5–5.2)
Alkaline Phosphatase: 44 U/L (ref 39–117)
BUN: 23 mg/dL (ref 6–23)
CO2: 30 mEq/L (ref 19–32)
Calcium: 10.1 mg/dL (ref 8.4–10.5)
Chloride: 103 mEq/L (ref 96–112)
Creatinine, Ser: 1.39 mg/dL (ref 0.40–1.50)
GFR: 45.77 mL/min — ABNORMAL LOW (ref >60.00)
Glucose, Bld: 92 mg/dL (ref 70–99)
Potassium: 4 mEq/L (ref 3.5–5.1)
Sodium: 141 mEq/L (ref 135–145)
Total Bilirubin: 0.5 mg/dL (ref 0.2–1.2)
Total Protein: 6.5 g/dL (ref 6.0–8.3)

## 2021-03-29 LAB — IRON: Iron: 112 ug/dL (ref 42–165)

## 2021-03-29 MED ORDER — AMIODARONE HCL 200 MG PO TABS: ORAL_TABLET | ORAL | Status: DC

## 2021-03-29 NOTE — Patient Instructions (Signed)
Go to the lab on the way out.   If you have mychart we'll likely use that to update you.    Take care.  Glad to see you.  Drink enough water to keep your urine clear.   Stay off amiodarone for now.  Use metoprolol if you have heart racing.   If not better, seek help/call us.    We'll call about cardiology appointment.

## 2021-03-29 NOTE — Progress Notes (Signed)
This visit occurred during the SARS-CoV-2 public health emergency.  Safety protocols were in place, including screening questions prior to the visit, additional usage of staff PPE, and extensive cleaning of exam room while observing appropriate contact time as indicated for disinfecting solutions.  History of A. fib status post ablation.  We will doing well until 4 days ago on the golf [place]-at the time he noticed elevated heart rate, got sweaty, felt diffusely weak.  Was able to go home.  Took a nap at home and felt better.  No CP.  No syncope. Some occ heart racing.  No metoprolol use, d/w pt.  Has been off amiodarone but he is still on eliquis.  Episode on the golf course lasted about 20 min.  He drinks 1 cup of coffee at baseline.    No symptoms currently.  No chest pain.  Not short of breath.  Meds, vitals, and allergies reviewed.   ROS: Per HPI unless specifically indicated in ROS section   GEN: nad, alert and oriented HEENT: ncat NECK: supple w/o LA CV: rrr.  No ectopy noted on exam. PULM: ctab, no inc wob ABD: soft, +bs EXT: no edema SKIN: no acute rash

## 2021-03-31 NOTE — Assessment & Plan Note (Signed)
History of, status post ablation, recently with episode of tachycardia of unclear source.  Still on anticoagulation.  Not on beta-blocker or amiodarone currently.  Sounds to be in regular rate and rhythm currently.  Okay for outpatient follow-up.  Discussed options.  Refer to cardiology, check basic labs today, drink enough fluid to make sure he is not getting dehydrated since that could have contributed on the golf course, and routine cautions given to patient.  He is not having any symptoms currently and since he is feeling well now I think it makes sense to use beta-blocker as needed.  I did not advise him to restart amiodarone yet.  I will defer to cardiology about that.  See after visit summary.  Routine emergency cautions discussed with patient.

## 2021-04-03 ENCOUNTER — Telehealth: Payer: Self-pay | Admitting: Family Medicine

## 2021-04-03 NOTE — Telephone Encounter (Signed)
Pt called returning a call about labs

## 2021-04-03 NOTE — Telephone Encounter (Signed)
This was completed in other encounter.

## 2021-05-01 ENCOUNTER — Other Ambulatory Visit: Payer: Self-pay

## 2021-05-01 ENCOUNTER — Encounter: Payer: Self-pay | Admitting: Cardiology

## 2021-05-01 ENCOUNTER — Ambulatory Visit: Payer: Medicare HMO | Admitting: Cardiology

## 2021-05-01 VITALS — BP 118/72 | HR 84 | Ht 70.0 in | Wt 153.8 lb

## 2021-05-01 DIAGNOSIS — I10 Essential (primary) hypertension: Secondary | ICD-10-CM | POA: Diagnosis not present

## 2021-05-01 DIAGNOSIS — I4891 Unspecified atrial fibrillation: Secondary | ICD-10-CM

## 2021-05-01 DIAGNOSIS — C679 Malignant neoplasm of bladder, unspecified: Secondary | ICD-10-CM | POA: Diagnosis not present

## 2021-05-01 NOTE — Patient Instructions (Addendum)

## 2021-05-01 NOTE — Progress Notes (Signed)
Electrophysiology Office Note:    Date:  05/01/2021   ID:  Timothy Bullock, DOB 04-May-1932, MRN [id number]  PCP:  Tonia Ghent, MD  [facility] Cardiologist:  None  [facility] Electrophysiologist:  Vickie Epley, MD   Referring MD: Tonia Ghent, MD   Chief Complaint: Atrial fibrillation  History of Present Illness:    Timothy Bullock is a 87 y.o. male who presents for an evaluation of atrial fibrillation at the request of Dr. Damita Dunnings. Their medical history includes bladder cancer post treatment/surgery at [facility], hypertension, paroxysmal atrial fibrillation post PVI at [facility], prostate cancer post resection.  The patient is extremely active and golfs.  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  He tells me that recently while playing golf he was extremely hot and sweaty.  He had an episode of lightheadedness and tachycardia.  He has not had an episode since.  He thinks he was dehydrated.  His wife passed away last year.  He tells me has been very rough since she passed.  She was in her mid 76s.  Past Medical History:  Diagnosis Date   ARF (acute renal failure) ([place])    Bladder cancer ([place])    Blood in stool    Blood transfusion    Autologous donation with prostate surgery   Diverticulitis    H/O cardiac radiofrequency ablation    HTN (hypertension)    Paroxysmal atrial fibrillation ([place]) 05/28/2012   s/p ablation   Prostate cancer [facility])    s/p resection   Ulcer    gastric   Urinary incontinence     Past Surgical History:  Procedure Laterality Date   BLADDER REMOVAL     CARDIAC CATHETERIZATION  2013   [place]   COLONOSCOPY  02/2012   negative pathology    COLOSTOMY     with bladder cancer surgery 2017   OTHER SURGICAL HISTORY     urostomy   PERCUTANEOUS NEPHROSTOMY  2017   PROSTATECTOMY  1993    Current Medications: Current Meds  Medication Sig   CVS SENNA 8.6 MG tablet  TAKE 2 TABLETS BY MOUTH AT BEDTIME AS NEEDED FOR CONSTIPATION.   ELIQUIS 5 MG TABS tablet TAKE 1 TABLET BY MOUTH TWO  TIMES DAILY   ferrous sulfate 325 (65 FE) MG tablet Take 1 tablet (325 mg total) by mouth every Monday, Wednesday, and Friday.   metoprolol tartrate (LOPRESSOR) 25 MG tablet Take 0.5 tablets (12.5 mg total) by mouth 2 (two) times daily as needed (for heart racing.).   omeprazole (PRILOSEC) 20 MG capsule Take 20 mg by mouth daily.      Allergies:   Patient has no known allergies.   Social History   Socioeconomic History   Marital status: Married    Spouse name: Not on file   Number of children: Not on file   Years of education: Not on file   Highest education level: Not on file  Occupational History   Occupation: Retired  Tobacco Use   Smoking status: Former    Types: Cigarettes    Quit date: 09/15/1965    Years since quitting: 55.6   Smokeless tobacco: Never  Substance and Sexual Activity   Alcohol use: Yes    Alcohol/week: 0.0 standard drinks    Comment: Rare   Drug use: No   Sexual activity: Never  Other Topics Concern  Not on file  Social History Narrative   Widowed 2021------ was married in 1982.   High school grad   Regular exercise:  Yes, walking   From [place]   [place].  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  ROS:   Please see the history of present illness.    All other systems reviewed and are negative.  EKGs/Labs/Other Studies Reviewed:    The following studies were reviewed today:  Prior records  EKG:  The ekg ordered today demonstrates atrial  fibrillation with a right bundle branch block.  Ventricular rate of 84 bpm.  Recent Labs: 03/29/2021: ALT 14; BUN 23; Creatinine, Ser 1.39; Hemoglobin 13.1; Platelets 226.0; Potassium 4.0; Sodium 141; TSH 0.41  Recent Lipid Panel    Component Value Date/Time   CHOL 178 06/20/2019 0934   TRIG 105.0 06/20/2019 0934   HDL 55.20 06/20/2019 0934   CHOLHDL 3 06/20/2019 0934   VLDL 21.0 06/20/2019 0934   LDLCALC 102 (H) 06/20/2019 0934    Physical Exam:    VS:  BP 118/72   Pulse 84   Ht 5\' 10"  (1.778 m)   Wt 153 lb 12.8 oz (69.8 kg)   SpO2 97%   BMI 22.07 kg/m     Wt Readings from Last 3 Encounters:  05/01/21 153 lb 12.8 oz (69.8 kg)  03/29/21 155 lb 6 oz (70.5 kg)  11/02/20 151 lb (68.5 kg)     GEN:  Well nourished, well developed in no acute distress.  Appears younger than stated age 69: Normal NECK: No JVD; No carotid bruits LYMPHATICS: No lymphadenopathy CARDIAC: Irregularly irregular rate in the 80s, no murmurs, rubs, gallops RESPIRATORY:  Clear to auscultation without rales, wheezing or rhonchi  ABDOMEN: Soft, non-tender, non-distended MUSCULOSKELETAL:  No edema; No deformity  SKIN: Warm and dry NEUROLOGIC:  Alert and oriented x 3 PSYCHIATRIC:  Normal affect   ASSESSMENT:    1. Atrial fibrillation, unspecified type ([place])   2. Primary hypertension   3. Malignant neoplasm of urinary bladder, unspecified site [facility])    PLAN:    In order of problems listed above:   1. atrial fibrillation, persistent versus permanent (HCC) Rate controlled.  On Eliquis for stroke prophylaxis.  We discussed the options for managing his atrial fibrillation including continued rate control, rhythm control using antiarrhythmic drug therapy.  He is not a candidate for ablation given his age.  He is asymptomatic with his rate controlled atrial fibrillation.  I suspect his previous episode on the golf course was related to dehydration which likely prompted an accelerated ventricular rate.  I  would favor conservative management given his age and likelihood of creating more symptoms by adding an antiarrhythmic drug.  For now, recommend continuing metoprolol for rate control.  I encouraged him to stay hydrated.  He should continue taking Eliquis for stroke prophylaxis.  2. Primary hypertension At goal today.  Continue current regimen.  Close follow-up with primary care physician.  3. Malignant neoplasm of urinary bladder, unspecified site (HCC) Treated at [facility].  I have encouraged him to stay as active as  possible.   Medication Adjustments/Labs and Tests Ordered: Current medicines are reviewed at length with the patient today.  Concerns regarding medicines are outlined above.  No orders of the defined types were placed in this encounter.  No orders of the defined types were placed in this encounter.    Signed, Hilton Cork. Quentin Ore, MD, [facility], [facility] 05/01/2021 3:44 PM    Electrophysiology [facility]

## 2021-07-15 ENCOUNTER — Other Ambulatory Visit: Payer: Self-pay

## 2021-07-15 ENCOUNTER — Telehealth: Payer: Self-pay

## 2021-07-15 DIAGNOSIS — I480 Paroxysmal atrial fibrillation: Secondary | ICD-10-CM

## 2021-07-15 NOTE — Telephone Encounter (Signed)
Received letter in the mail from patient requesting a refill on eliquis. Patient stated in letter that he was out of the medication. I called patient to see where he would like this sent to but no answer; lmtcb.

## 2021-07-17 NOTE — Telephone Encounter (Signed)
LMTCB

## 2021-07-23 NOTE — Telephone Encounter (Signed)
LMTCB

## 2021-07-26 NOTE — Telephone Encounter (Signed)
LMTCB

## 2021-09-17 ENCOUNTER — Telehealth: Payer: Self-pay | Admitting: Family Medicine

## 2021-09-17 NOTE — Telephone Encounter (Signed)
LVM for pt to rtn my call to schedule AWV with [facility].  

## 2021-09-29 ENCOUNTER — Other Ambulatory Visit: Payer: Self-pay

## 2021-09-29 ENCOUNTER — Emergency Department: Payer: Medicare HMO

## 2021-09-29 ENCOUNTER — Emergency Department
Admission: EM | Admit: 2021-09-29 | Discharge: 2021-09-29 | Disposition: A | Discharge status: Home / Self Care | Payer: Medicare HMO | Attending: Emergency Medicine | Admitting: Emergency Medicine

## 2021-09-29 ENCOUNTER — Encounter: Payer: Self-pay | Admitting: Emergency Medicine

## 2021-09-29 DIAGNOSIS — S6991XA Unspecified injury of right wrist, hand and finger(s), initial encounter: Secondary | ICD-10-CM | POA: Diagnosis present

## 2021-09-29 DIAGNOSIS — Z23 Encounter for immunization: Secondary | ICD-10-CM | POA: Diagnosis not present

## 2021-09-29 DIAGNOSIS — W5501XA Bitten by cat, initial encounter: Secondary | ICD-10-CM | POA: Insufficient documentation

## 2021-09-29 DIAGNOSIS — S61431A Puncture wound without foreign body of right hand, initial encounter: Secondary | ICD-10-CM | POA: Insufficient documentation

## 2021-09-29 MED ORDER — AMOXICILLIN-POT CLAVULANATE 875-125 MG PO TABS
1.0000 | ORAL_TABLET | Freq: Two times a day (BID) | ORAL | 0 refills | Status: AC
Start: 2021-09-29 — End: 2021-10-09

## 2021-09-29 MED ORDER — TETANUS-DIPHTH-ACELL PERTUSSIS 5-2.5-18.5 LF-MCG/0.5 IM SUSY
0.5000 mL | PREFILLED_SYRINGE | Freq: Once | INTRAMUSCULAR | Status: AC
  Administered 2021-09-29: 0.5 mL via INTRAMUSCULAR
  Filled 2021-09-29: qty 0.5

## 2021-09-29 MED ORDER — AMOXICILLIN-POT CLAVULANATE 875-125 MG PO TABS
1.0000 | ORAL_TABLET | Freq: Once | ORAL | Status: AC
  Administered 2021-09-29: 1 via ORAL
  Filled 2021-09-29: qty 1

## 2021-09-29 NOTE — ED Triage Notes (Signed)
Pt reports was bitten by a stray cat that he has been feeding and was told to come to the ED for treatment.

## 2021-09-29 NOTE — ED Provider Notes (Signed)
[facility] Provider Note  Patient Contact: 5:14 PM (approximate)   History   Animal Bite   HPI  Timothy Bullock is a 88 y.o. male with noncontributory medical history, presents to the ED for unintentional cat bite to the right hand.  Patient reports that he was bitten on the right thenar prominence by a cat that he has been feeding for the last 2 years.  The cat, although well-known, is considered a stray, as he is not owned by any of the residents in the community.  Patient is a resident at [facility], and reports that he feeds this cat at least twice daily without incident.  Today apparently, the cat took notion to bite the patient on his palm as he placed the feed bowl in front of them.  Patient reports the cat otherwise seemed to be healthy and without signs of illness, mange, or erratic behavior.  She also reports that the cat is likely at his residence now, looking for dinner.  Patient reports that he likely can kennel the cat for quarantine.   Physical Exam   Triage Vital Signs: ED Triage Vitals  Enc Vitals Group     BP 09/29/21 1637 139/86     Pulse Rate 09/29/21 1637 97     Resp 09/29/21 1637 18     Temp 09/29/21 1636 98.1 F (36.7 C)     Temp Source 09/29/21 1636 Oral     SpO2 09/29/21 1637 97 %     Weight 09/29/21 1615 153 lb 14.1 oz (69.8 kg)     Height 09/29/21 1615 5\' 10"  (1.778 m)     Head Circumference --      Peak Flow --      Pain Score 09/29/21 1614 4     Pain Loc --      Pain Edu? --      Excl. in [place]? --     Most recent vital signs: Vitals:   09/29/21 1636 09/29/21 1637  BP:  139/86  Pulse:  97  Resp:  18  Temp: 98.1 F (36.7 C)   SpO2:  97%     General: Alert and in no acute distress. Cardiovascular:  Good peripheral perfusion Respiratory: Normal respiratory effort without tachypnea or retractions. Lungs CTAB.  Musculoskeletal: Full range of motion to all extremities.  Normal composite fist on the right.   Patient with 2 puncture marks with thenar prominence with some surrounding erythema.  Patient has a marker dry and a perimeter around the area of erythema that extends no more than an area of 2 x 2 cm.  No lymphangitis or streaking is noted of the proximal forearm. Neurologic:  No gross focal neurologic deficits are appreciated.  Skin:   No rash noted Other:   ED Results / Procedures / Treatments   Labs (all labs ordered are listed, but only abnormal results are displayed) Labs Reviewed - No data to display   EKG     RADIOLOGY    DG Hand 2 View Right  Result Date: 09/29/2021 CLINICAL DATA:  Cat bite to the right hand pain along the thenar eminence. EXAM: RIGHT HAND - 2 VIEW COMPARISON:  None. FINDINGS: No fracture.  No bone lesion. Mild arthropathic changes with mild narrowing of several interphalangeal joints as well as the third metacarpophalangeal joint. No periarticular erosions. Soft tissues are unremarkable. No radiopaque foreign body or soft tissue air. IMPRESSION: 1. No fracture or dislocation. 2. No soft tissue  air or radiopaque foreign body. Electronically Signed   By: Lajean Manes M.D.   On: 09/29/2021 18:00    PROCEDURES:  Critical Care performed: No  Procedures   MEDICATIONS ORDERED IN ED: Medications  Tdap (BOOSTRIX) injection 0.5 mL (0.5 mLs Intramuscular Given 09/29/21 1738)  amoxicillin-clavulanate (AUGMENTIN) 875-125 MG per tablet 1 tablet (1 tablet Oral Given 09/29/21 1738)     IMPRESSION / MDM / ASSESSMENT AND PLAN / ED COURSE  I reviewed the triage vital signs and the nursing notes.                              Differential diagnosis includes, but is not limited to, cat bite, tendinitis, cellulitis, abscess  Patient's diagnosis is consistent with local cat bite with early erythema mild cellulitis.  Patient vital signs are stable and no signs of acute tenosynovitis of abscess formation requiring IV antibiotics or admission.  Patient Will be started  empirically on Augmentin and his tetanus is updated at this time.  Radiographs reviewed by me did not reveal any acute soft tissue injury, retained foreign body, or subcu air to the right hand.  Patient will be discharged home with prescriptions for Augmentin. Patient is to follow up with [place] control to report the bite.  Patient will also attempt to kennel the cat, and given to [place] control for a 10-day quarantine.  Patient is also given strict return precautions in regards to his local wound.  He should return for signs of worsening or spreading infection.  No indication at this time for immediate initiation of the rabies vaccine, as the patient has daily contact with this unclean cat.  As needed or otherwise directed. Patient is given ED precautions to return to the ED for any worsening or new symptoms.   FINAL CLINICAL IMPRESSION(S) / ED DIAGNOSES   Final diagnoses:  Cat bite, initial encounter     Rx / DC Orders   ED Discharge Orders          Ordered    amoxicillin-clavulanate (AUGMENTIN) 875-125 MG tablet  2 times daily        09/29/21 1812             Note:  This document was prepared using Dragon voice recognition software and may include unintentional dictation errors.    Melvenia Needles, PA-C 09/29/21 1832    Rada Hay, MD 09/30/21 1356

## 2021-09-29 NOTE — Discharge Instructions (Addendum)
You are being treated with antibiotics for your cat bite. Keep the wound clean, dry, and covered. Take the antibiotics as directed. Return to the Emergency Department for any signs of worsening or spreading infection in the hand.   If you can safely capture the cat, you should then contact [facility] [Phone: [phone]] to report the animal bite and allow them to take custody for a 10-day quarantine period. If you cannot, you should still contact [facility] to make report.   If the cat can not be captured/quarantined, you should return to the ED for your rabies vaccine series within the week.

## 2021-10-14 ENCOUNTER — Ambulatory Visit (INDEPENDENT_AMBULATORY_CARE_PROVIDER_SITE_OTHER): Payer: Medicare HMO | Admitting: Family Medicine

## 2021-10-14 ENCOUNTER — Encounter: Payer: Self-pay | Admitting: Family Medicine

## 2021-10-14 ENCOUNTER — Other Ambulatory Visit: Payer: Self-pay

## 2021-10-14 VITALS — BP 120/70 | HR 80 | Temp 97.9°F | Ht 69.0 in | Wt 158.0 lb

## 2021-10-14 DIAGNOSIS — Z7189 Other specified counseling: Secondary | ICD-10-CM

## 2021-10-14 DIAGNOSIS — Z933 Colostomy status: Secondary | ICD-10-CM

## 2021-10-14 DIAGNOSIS — D649 Anemia, unspecified: Secondary | ICD-10-CM

## 2021-10-14 DIAGNOSIS — I480 Paroxysmal atrial fibrillation: Secondary | ICD-10-CM

## 2021-10-14 DIAGNOSIS — Z8679 Personal history of other diseases of the circulatory system: Secondary | ICD-10-CM

## 2021-10-14 DIAGNOSIS — Z0000 Encounter for general adult medical examination without abnormal findings: Secondary | ICD-10-CM

## 2021-10-14 DIAGNOSIS — Z436 Encounter for attention to other artificial openings of urinary tract: Secondary | ICD-10-CM

## 2021-10-14 LAB — CBC WITH DIFFERENTIAL/PLATELET
Basophils Absolute: 0.1 10*3/uL (ref 0.0–0.1)
Basophils Relative: 0.7 % (ref 0.0–3.0)
Eosinophils Absolute: 0.3 10*3/uL (ref 0.0–0.7)
Eosinophils Relative: 3.8 % (ref 0.0–5.0)
HCT: 39.9 % (ref 39.0–52.0)
Hemoglobin: 13.1 g/dL (ref 13.0–17.0)
Lymphocytes Relative: 13 % (ref 12.0–46.0)
Lymphs Abs: 1 10*3/uL (ref 0.7–4.0)
MCHC: 32.8 g/dL (ref 30.0–36.0)
MCV: 96.4 fl (ref 78.0–100.0)
Monocytes Absolute: 0.7 10*3/uL (ref 0.1–1.0)
Monocytes Relative: 9.2 % (ref 3.0–12.0)
Neutro Abs: 5.8 10*3/uL (ref 1.4–7.7)
Neutrophils Relative %: 73.3 % (ref 43.0–77.0)
Platelets: 278 10*3/uL (ref 150.0–400.0)
RBC: 4.14 Mil/uL — ABNORMAL LOW (ref 4.22–5.81)
RDW: 13.9 % (ref 11.5–15.5)
WBC: 7.9 10*3/uL (ref 4.0–10.5)

## 2021-10-14 LAB — LIPID PANEL
Cholesterol: 207 mg/dL — ABNORMAL HIGH (ref 0–200)
HDL: 60.1 mg/dL (ref >39.00)
LDL Cholesterol: 126 mg/dL — ABNORMAL HIGH (ref 0–99)
NonHDL: 146.84
Total CHOL/HDL Ratio: 3
Triglycerides: 104 mg/dL (ref 0.0–149.0)
VLDL: 20.8 mg/dL (ref 0.0–40.0)

## 2021-10-14 LAB — COMPREHENSIVE METABOLIC PANEL
ALT: 11 U/L (ref 0–53)
AST: 14 U/L (ref 0–37)
Albumin: 4.1 g/dL (ref 3.5–5.2)
Alkaline Phosphatase: 45 U/L (ref 39–117)
BUN: 28 mg/dL — ABNORMAL HIGH (ref 6–23)
CO2: 32 mEq/L (ref 19–32)
Calcium: 10.1 mg/dL (ref 8.4–10.5)
Chloride: 106 mEq/L (ref 96–112)
Creatinine, Ser: 1.42 mg/dL (ref 0.40–1.50)
GFR: 44.44 mL/min — ABNORMAL LOW (ref >60.00)
Glucose, Bld: 70 mg/dL (ref 70–99)
Potassium: 4.3 mEq/L (ref 3.5–5.1)
Sodium: 144 mEq/L (ref 135–145)
Total Bilirubin: 0.5 mg/dL (ref 0.2–1.2)
Total Protein: 6.8 g/dL (ref 6.0–8.3)

## 2021-10-14 LAB — TSH: TSH: 0.44 u[IU]/mL (ref 0.35–5.50)

## 2021-10-14 LAB — IRON: Iron: 126 ug/dL (ref 42–165)

## 2021-10-14 NOTE — Progress Notes (Addendum)
This visit occurred during the SARS-CoV-2 public health emergency.  Safety protocols were in place, including screening questions prior to the visit, additional usage of staff PPE, and extensive cleaning of exam room while observing appropriate contact time as indicated for disinfecting solutions.  I have personally reviewed the Medicare Annual Wellness questionnaire and have noted 1. The patient's medical and social history 2. Their use of alcohol, tobacco or illicit drugs 3. Their current medications and supplements 4. The patient's functional ability including ADL's, fall risks, home safety risks and hearing or visual             impairment. 5. Diet and physical activities 6. Evidence for depression or mood disorders  The patients weight, height, BMI have been recorded in the chart and visual acuity is per eye clinic.  I have made referrals, counseling and provided education to the patient based review of the above and I have provided the pt with a written personalized care plan for preventive services.  Provider list updated- see scanned forms.  Routine anticipatory guidance given to patient.  See health maintenance. The possibility exists that previously documented standard health maintenance information may have been brought forward from a previous encounter into this note.  If needed, that same information has been updated to reflect the current situation based on today's encounter.    Flu 2022 Shingles discussed with patient PNA previously done Tetanus 2023 COVID-vaccine previously done Colon cancer screening not due given his age. Prostate cancer screening deferred. Advance directive-daughter Izora Gala designated if patient incapacitated. Cognitive function addressed- see scanned forms- and if abnormal then additional documentation follows.   In addition to [facility], follow up visit for the below conditions:  R hand has nearly healed from prev bite.  Minimal scab now.  No  erythema.  The cat was quarantined and was behaving normally.  Done with abx.  No pain.  Tetanus up-to-date.  Off iron and amiodarone.  S/p ablation.  Still on elquis.  No recent metoprolol use or need.  Still on PPI at baseline.    Urostomy and colostomy at baseline.  Some mild irritation at the base but it is manageable.    PMH and SH reviewed  Meds, vitals, and allergies reviewed.   ROS: Per HPI.  Unless specifically indicated otherwise in HPI, the patient denies:  General: fever. Eyes: acute vision changes ENT: sore throat Cardiovascular: chest pain Respiratory: SOB GI: vomiting GU: dysuria Musculoskeletal: acute back pain Derm: acute rash Neuro: acute motor dysfunction Psych: worsening mood Endocrine: polydipsia Heme: bleeding Allergy: hayfever  GEN: nad, alert and oriented HEENT:ncat NECK: supple w/o LA CV: rrr. PULM: ctab, no inc wob ABD: soft, +bs, urostomy and colostomy intact.  Both appear normal. EXT: no edema SKIN: no acute rash.  Right thenar area not tender to palpation.  No spreading erythema.  Lesion looks almost totally healed with minimal residual scab.

## 2021-10-14 NOTE — Patient Instructions (Addendum)
Go to the lab on the way out.   If you have mychart we'll likely use that to update you.    ?Take care.  Glad to see you. ?Don't change your meds for now.  ?

## 2021-10-16 NOTE — Assessment & Plan Note (Signed)
°  Flu 2022 Shingles discussed with patient PNA previously done Tetanus 2023 COVID-vaccine previously done Colon cancer screening not due given his age. Prostate cancer screening deferred. Advance directive-daughter Izora Gala designated if patient incapacitated. Cognitive function addressed- see scanned forms- and if abnormal then additional documentation follows.

## 2021-10-16 NOTE — Assessment & Plan Note (Signed)
He is managing with current supplies and can update me as needed.

## 2021-10-16 NOTE — Assessment & Plan Note (Signed)
He has some mild irritation at the base of his urostomy but is manageable.  He will update me as needed.

## 2021-10-16 NOTE — Assessment & Plan Note (Addendum)
Off amiodarone but still on Eliquis.  Would continue PPI in the meantime.  See notes on follow-up labs.  No recent metoprolol use or need.  See notes on labs.

## 2021-10-16 NOTE — Assessment & Plan Note (Signed)
Advance directive-daughter Izora Gala designated if patient incapacitated.

## 2021-12-09 ENCOUNTER — Ambulatory Visit (INDEPENDENT_AMBULATORY_CARE_PROVIDER_SITE_OTHER): Payer: Medicare HMO | Admitting: Nurse Practitioner

## 2021-12-09 ENCOUNTER — Other Ambulatory Visit: Payer: Self-pay

## 2021-12-09 ENCOUNTER — Encounter: Payer: Self-pay | Admitting: Nurse Practitioner

## 2021-12-09 VITALS — BP 108/64 | HR 86 | Temp 96.9°F | Resp 12 | Ht 69.0 in | Wt 162.4 lb

## 2021-12-09 DIAGNOSIS — M62830 Muscle spasm of back: Secondary | ICD-10-CM

## 2021-12-09 MED ORDER — TIZANIDINE HCL 2 MG PO TABS: 2.0000 mg | ORAL_TABLET | Freq: Every day | ORAL | 0 refills | Status: DC

## 2021-12-09 NOTE — Assessment & Plan Note (Signed)
No bony tenderness on exam.  Patient does have tight muscles bilateral lower thoracic upper lumbar paraspinal area.  Patient is tried over-the-counter analgesics along with heat and ice without relief.  Did discuss imaging with patient and we both agreed to defer currently.  We will try low-dose muscle relaxer tizanidine 2 mg nightly.  Did review this relaxer precautions with patient inclusive of increased dizziness and increased risk of falls.  Patient acknowledged states he only does not get up at nighttime as he does have a urostomy.  He does live at [address] [place] and showed me his brace that he can press if he needs help at any point during the night.  Follow-up if no improvement ? ? ?

## 2021-12-09 NOTE — Progress Notes (Signed)
? ?Acute Office Visit ? ?Subjective:  ? ? Patient ID: Timothy Bullock, male    DOB: 1932-06-08, 88 y.o.   MRN: [id number] ? ?Chief Complaint  ?Patient presents with  ? Back Pain  ?  X 3 days, lower mid back, spasm sensation. This started after bending down in the car to get his golf clubs and the spasm started. Has taking Advil, Aleve, did not get relief from these medications.  ? ? ?Back Pain ?Pertinent negatives include no fever, numbness or weakness.  ?Patient is in today for  Back pain ? ?Started Friday. Bent down to reach for his  golf clubs and then it started. States that it is a spasm feeling, described as a Sharp stabbing and dull aching pain that is intermittent ?No B&B involvement.  ?States history of bladder cancer. Has urostomy and colostomy that is followed by [facility]. No history of true back problems or surgery  ? ?Has tried advil, tylenol. No help. Has been trying heat and ice and no relief  ? ?Past Medical History:  ?Diagnosis Date  ? ARF (acute renal failure) ([place])   ? Bladder cancer ([place])   ? Blood in stool   ? Blood transfusion   ? Autologous donation with prostate surgery  ? Diverticulitis   ? H/O cardiac radiofrequency ablation   ? HTN (hypertension)   ? Paroxysmal atrial fibrillation ([place]) 05/28/2012  ? s/p ablation  ? Prostate cancer ([place])   ? s/p resection  ? Ulcer   ? gastric  ? Urinary incontinence   ? ? ?Past Surgical History:  ?Procedure Laterality Date  ? BLADDER REMOVAL    ? CARDIAC CATHETERIZATION  2013  ? [place]  ? COLONOSCOPY  02/2012  ? negative pathology   ? COLOSTOMY    ? with bladder cancer surgery 2017  ? OTHER SURGICAL HISTORY    ? urostomy  ? PERCUTANEOUS NEPHROSTOMY  2017  ? PROSTATECTOMY  1993  ? ? ?Family History  ?Problem Relation Age of Onset  ? Cancer Mother   ?     H/O colon CA  ? Alcohol abuse Father   ? Cancer Father   ?     CA of tongue  ? Cancer Other   ?     colon, parents <60  ? ? ?Social History  ? ?Socioeconomic History  ? Marital status: Widowed  ?  Spouse name: Not  on file  ? Number of children: Not on file  ? Years of education: Not on file  ? Highest education level: Not on file  ?Occupational History  ? Occupation: Retired  ?Tobacco Use  ? Smoking status: Former  ?  Types: Cigarettes  ?  Quit date: 09/15/1965  ?  Years since quitting: 56.2  ? Smokeless tobacco: Never  ?Substance and Sexual Activity  ? Alcohol use: Yes  ?  Alcohol/week: 0.0 standard drinks  ?  Comment: Rare  ? Drug use: No  ? Sexual activity: Never  ?Other Topics Concern  ? Not on file  ?Social History Narrative  ? Widowed 2021------ was married in 1982.  ? High school grad  ? Regular exercise:  Yes, walking  ? From [place]  ? [id number].  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 ?Social Determinants of Health  ? ?Financial Resource Strain: Not on file  ?Food Insecurity: Not on file  ?Transportation Needs: Not on file  ?Physical Activity: Not on file  ?Stress: Not on file  ?Social Connections:  Not on file  ?Intimate Partner Violence: Not on file  ? ? ?Outpatient Medications Prior to Visit  ?Medication Sig Dispense Refill  ? [facility] SENNA 8.6 MG tablet TAKE 2 TABLETS BY MOUTH AT BEDTIME AS NEEDED FOR CONSTIPATION. 60 tablet 3  ? ELIQUIS 5 MG TABS tablet TAKE 1 TABLET BY MOUTH TWO  TIMES DAILY 180 tablet 0  ? metoprolol tartrate (LOPRESSOR) 25 MG tablet Take 0.5 tablets (12.5 mg total) by mouth 2 (two) times daily as needed (for heart racing.). 60 tablet 3  ? omeprazole (PRILOSEC) 20 MG capsule Take 20 mg by mouth daily.     ? ?No facility-administered medications prior to visit.  ? ? ?No Known Allergies ? ?Review of Systems  ?Constitutional:  Negative for chills and fever.  ?Genitourinary:  Negative for difficulty urinating.  ?     Denies B&B involvement ?Does have a urostomy and colostomy   ?Musculoskeletal:  Positive for back pain and myalgias.  ?Neurological:  Negative for weakness and numbness.  ? ?   ?Objective:  ?  ?Physical Exam ?Vitals and nursing note reviewed.  ?Constitutional:   ?    Appearance: Normal appearance.  ?Cardiovascular:  ?   Rate and Rhythm: Normal rate. Rhythm irregular.  ?   Heart sounds: Normal heart sounds.  ?Pulmonary:  ?   Effort: Pulmonary effort is normal.  ?   Breath sounds: Normal breath sounds.  ?Musculoskeletal:     ?   General: Tenderness present.  ?     Arms: ? ?   Thoracic back: Tenderness present. No bony tenderness.  ?   Lumbar back: Tenderness present. No bony tenderness. Negative right straight leg raise test and negative left straight leg raise test.  ?   Right lower leg: No edema.  ?   Left lower leg: No edema.  ?   Comments: Muscle tightness with lateral movements. ?No spinal bone tenderness.   ?Neurological:  ?   General: No focal deficit present.  ?   Mental Status: He is alert.  ?   Motor: No weakness.  ?   Gait: Gait normal.  ?   Deep Tendon Reflexes:  ?   Reflex Scores: ?     Patellar reflexes are 2+ on the right side and 2+ on the left side. ?   Comments: Bilateral lower extremtity strength 5/5  ? ? ?BP 108/64   Pulse 86   Temp (!) 96.9 ?F (36.1 ?C)   Resp 12   Ht 5\' 9"  (1.753 m)   Wt 162 lb 6 oz (73.7 kg)   SpO2 100%   BMI 23.98 kg/m?  ?Wt Readings from Last 3 Encounters:  ?12/09/21 162 lb 6 oz (73.7 kg)  ?10/14/21 158 lb (71.7 kg)  ?09/29/21 153 lb 14.1 oz (69.8 kg)  ? ? ?Health Maintenance Due  ?Topic Date Due  ? Zoster Vaccines- Shingrix (1 of 2) Never done  ? Pneumonia Vaccine 40+ Years old (2 - PCV) 06/30/2012  ? COVID-19 Vaccine (4 - Booster for Moderna series) 09/25/2020  ? ? ?There are no preventive care reminders to display for this patient. ? ? ?Lab Results  ?Component Value Date  ? TSH 0.44 10/14/2021  ? ?Lab Results  ?Component Value Date  ? WBC 7.9 10/14/2021  ? HGB 13.1 10/14/2021  ? HCT 39.9 10/14/2021  ? MCV 96.4 10/14/2021  ? PLT 278.0 10/14/2021  ? ?Lab Results  ?Component Value Date  ? NA 144 10/14/2021  ? K 4.3 10/14/2021  ?  CO2 32 10/14/2021  ? GLUCOSE 70 10/14/2021  ? BUN 28 (H) 10/14/2021  ? CREATININE 1.42 10/14/2021  ?  BILITOT 0.5 10/14/2021  ? ALKPHOS 45 10/14/2021  ? AST 14 10/14/2021  ? ALT 11 10/14/2021  ? PROT 6.8 10/14/2021  ? ALBUMIN 4.1 10/14/2021  ? CALCIUM 10.1 10/14/2021  ? ANIONGAP 6 07/25/2016  ? GFR 44.44 (L) 10/14/2021  ? ?Lab Results  ?Component Value Date  ? CHOL 207 (H) 10/14/2021  ? ?Lab Results  ?Component Value Date  ? HDL 60.10 10/14/2021  ? ?Lab Results  ?Component Value Date  ? LDLCALC 126 (H) 10/14/2021  ? ?Lab Results  ?Component Value Date  ? TRIG 104.0 10/14/2021  ? ?Lab Results  ?Component Value Date  ? CHOLHDL 3 10/14/2021  ? ?No results found for: HGBA1C ? ?   ?Assessment & Plan:  ? ?Problem List Items Addressed This Visit   ? ?  ? Other  ? Back muscle spasm - Primary  ?  No bony tenderness on exam.  Patient does have tight muscles bilateral lower thoracic upper lumbar paraspinal area.  Patient is tried over-the-counter analgesics along with heat and ice without relief.  Did discuss imaging with patient and we both agreed to defer currently.  We will try low-dose muscle relaxer tizanidine 2 mg nightly.  Did review this relaxer precautions with patient inclusive of increased dizziness and increased risk of falls.  Patient acknowledged states he only does not get up at nighttime as he does have a urostomy.  He does live at [address] and showed me his brace that he can press if he needs help at any point during the night.  Follow-up if no improvement ? ? ?  ?  ? Relevant Medications  ? tiZANidine (ZANAFLEX) 2 MG tablet  ? ? ? ?Meds ordered this encounter  ?Medications  ? tiZANidine (ZANAFLEX) 2 MG tablet  ?  Sig: Take 1 tablet (2 mg total) by mouth at bedtime for 7 days.  ?  Dispense:  7 tablet  ?  Refill:  0  ?  Order Specific Question:   Supervising Provider  ?  Answer:   Loura Pardon A [[id number]]  ? ?This visit occurred during the SARS-CoV-2 public health emergency.  Safety protocols were in place, including screening questions prior to the visit, additional usage of staff PPE, and extensive cleaning of  exam room while observing appropriate contact time as indicated for disinfecting solutions.  ? ?Romilda Garret, NP ? ?

## 2021-12-09 NOTE — Patient Instructions (Signed)
Nice to see you today ?I sent in a muscle relaxer for you to take at night to help with the back and sleep ?Follow up if no improvement or worsening of your symptoms ?

## 2021-12-16 ENCOUNTER — Ambulatory Visit (INDEPENDENT_AMBULATORY_CARE_PROVIDER_SITE_OTHER)
Admission: RE | Admit: 2021-12-16 | Discharge: 2021-12-16 | Disposition: A | Discharge status: Home / Self Care | Payer: Medicare HMO | Source: Ambulatory Visit | Attending: Family Medicine | Admitting: Family Medicine

## 2021-12-16 ENCOUNTER — Encounter: Payer: Self-pay | Admitting: Family Medicine

## 2021-12-16 ENCOUNTER — Ambulatory Visit (INDEPENDENT_AMBULATORY_CARE_PROVIDER_SITE_OTHER): Payer: Medicare HMO | Admitting: Family Medicine

## 2021-12-16 VITALS — BP 116/78 | HR 99 | Temp 97.3°F | Ht 69.0 in | Wt 156.0 lb

## 2021-12-16 DIAGNOSIS — M546 Pain in thoracic spine: Secondary | ICD-10-CM | POA: Diagnosis not present

## 2021-12-16 MED ORDER — HYDROCODONE-ACETAMINOPHEN 5-325 MG PO TABS: 0.5000 | ORAL_TABLET | Freq: Three times a day (TID) | ORAL | 0 refills | Status: DC | PRN

## 2021-12-16 NOTE — Progress Notes (Signed)
He was reaching for golf clubs in the back of his car, leaning over.  He felt it then.  B pain just inferior to the shoulder blades.  Sleeping in a recliner.  Tizanidine didn't help.  Diet and sleep affected.  Still on eliquis.  No bleeding.  Pain is not better.  No leg or arm sx.  No bruising.  Pain twisting and with leaning forward.   ? ?Meds, vitals, and allergies reviewed.  ? ?ROS: Per HPI unless specifically indicated in ROS section  ? ?Nad ?Ncat ?Neck supple, no LA ?Rrr ?Ctab ?Abd soft, not ttp ?Midline back not ttp.  Soreness just lateral to spine B inferior to the scapula.  L anterior lower ribs slightly ttp.   ?No rash.   ? ?Fall cautions d/w pt.  He has a shower chair.   ?

## 2021-12-16 NOTE — Patient Instructions (Signed)
Use the pain medicine if needed, sedation caution.   ?Go to the lab on the way out.   If you have mychart we'll likely use that to update you.    ?Take care.  Glad to see you. ?

## 2021-12-17 ENCOUNTER — Other Ambulatory Visit: Payer: Self-pay | Admitting: Family Medicine

## 2021-12-17 DIAGNOSIS — S22000A Wedge compression fracture of unspecified thoracic vertebra, initial encounter for closed fracture: Secondary | ICD-10-CM

## 2021-12-18 DIAGNOSIS — M546 Pain in thoracic spine: Secondary | ICD-10-CM | POA: Insufficient documentation

## 2021-12-18 NOTE — Assessment & Plan Note (Signed)
See notes on imaging he has 3 compression fractures in his back and I suspect those are contributing to his pain. ?I think he should see the neurosurgery clinic to see if he is a candidate for treatment and I preemptively put in a referral.  Okay to use hydrocodone in the meantime.  Routine cautions given to patient. ?

## 2021-12-19 ENCOUNTER — Other Ambulatory Visit: Payer: Self-pay

## 2021-12-19 MED ORDER — HYDROCODONE-ACETAMINOPHEN 5-325 MG PO TABS: 0.5000 | ORAL_TABLET | Freq: Four times a day (QID) | ORAL | 0 refills | Status: DC | PRN

## 2021-12-19 NOTE — Telephone Encounter (Signed)
Name of Medication: Hydrocodone apap 5-325 mg ?Name of [facility]acy: [facility] [place] ?Last Fill or Written Date and Quantity: # 15 on 12/16/21 but pt said having to take tid. ?Last Office Visit and Type: 12/16/21 back pain ?Next Office Visit and Type: none scheduled ?Last Controlled Substance Agreement Date: none seen ?Last EIH:DTPN seen ? ?Sending note to Dr Damita Dunnings and pt said would wait to hear from neurosurgeon referral. ? ? ?

## 2021-12-19 NOTE — Telephone Encounter (Signed)
Sent. Thanks.  ?He could take med every 6 hours if needed, less frequently if doing better.  Let me know if that isn't helping with pain.   ?

## 2021-12-23 ENCOUNTER — Ambulatory Visit: Payer: Medicare HMO | Admitting: Family

## 2021-12-23 NOTE — Telephone Encounter (Signed)
[facility] Night - Client ?TELEPHONE ADVICE RECORD ?[facility]? ?Patient ?Name: ?Timothy BUFFIN ?Bullock ?Gender: Male ?DOB: 1932-04-02 ?Age: 88 Y 13 M 30 D ?Return ?Phone ?Number: ?[id number] ?(Primary) ?Address: ?City/ ?State/ ?Zip: ?[place] ? [address] ?Client [facility] Night - Client ?Client Site [place] ?Provider Renford Dills - MD ?Contact Type Call ?Who Is Calling Patient / Member / Family / Caregiver ?Call Type Triage / Clinical ?Relationship To Patient Self ?Return Phone Number [phone] (Primary) ?Chief Complaint Prescription Refill or Medication Request (non ?symptomatic) ?Reason for Call Medication Question / Request ?Initial Comment Caller states he is waiting for a script to be called ?in ?Translation No ?Nurse Assessment ?Nurse: Vanice Sarah, RN, Mosetta Pigeon Date/Time Eilene Ghazi Time): 12/19/2021 10:20:11 PM ?Confirm and document reason for call. If ?symptomatic, describe symptoms. ?---Caller states he is waiting for a script to be called in. ?Dr. Damita Dunnings was [facility] [address] [phone] is ?out of the medication. Hydrocodone is the medication. ?Not sure if the [facility] in [place] has it ?in stock there. Has a compression fracture of the lower ?spine and is completely out of the medication. Patient ?says his pain is a 5/10 currently. Had 1 hydrocodone ?and took it about hour ago. Still has 4 left. Denies ?needing a triage at this time. ?Does the patient have any new or worsening ?symptoms? ---No ?Disp. Time (Eastern ?Time) Disposition Final User ?12/19/2021 10:38:29 PM Clinical Call Yes Vanice Sarah, RN, Mosetta Pigeon ?Comments ?User: Aggie Cosier, RN Date/Time Eilene Ghazi Time): 12/19/2021 10:38:15 PM ?Adivsed caller to call back with any new or worsening symptoms. Verbalized understandin ?

## 2021-12-24 MED ORDER — HYDROCODONE-ACETAMINOPHEN 5-325 MG PO TABS: 0.5000 | ORAL_TABLET | Freq: Four times a day (QID) | ORAL | 0 refills | Status: DC | PRN

## 2021-12-24 NOTE — Telephone Encounter (Addendum)
I sent the rx.  Please let me know if he can't get it filled.  Per EMR, it looks like he had it filled on 12/21/21.  I put on the rx to hold for next refill.  Thanks.  ?

## 2021-12-24 NOTE — Telephone Encounter (Signed)
Spoke with patient and he was able to get filled over the weekend.  ?

## 2022-01-08 ENCOUNTER — Telehealth: Payer: Self-pay

## 2022-01-08 MED ORDER — HYDROCODONE-ACETAMINOPHEN 5-325 MG PO TABS: 0.5000 | ORAL_TABLET | Freq: Four times a day (QID) | ORAL | 0 refills | Status: DC | PRN

## 2022-01-08 NOTE — Telephone Encounter (Signed)
[facility]  I sent a note to [place] to see about his referral.  Thanks. ?

## 2022-01-08 NOTE — Telephone Encounter (Signed)
Patient mailed a letter into the office that he has not received a call from neurosurgery yet. Looks like referral was just faxed on 12/31/21 to them. He stated in letter that he went to [facility] for treatment and wants a refill on the hydrocodone.  ? ?LOV - 12/16/21 ?NOV - Not scheduled ?RF - 12/24/21 #40/0 ? ?I did call back once I received letter to check on him and see how he is feeling but he did no answer. LMTCB ?

## 2022-01-08 NOTE — Addendum Note (Signed)
Addended by: Tonia Ghent on: 01/08/2022 10:00 PM ? ? Modules accepted: Orders ? ?

## 2022-01-09 ENCOUNTER — Telehealth: Payer: Self-pay | Admitting: Family Medicine

## 2022-01-09 NOTE — Telephone Encounter (Signed)
Pt is returning Dr. Josefine Class call from yesterday. Wants to speak with the nurse about that.  ? ?Callback Number: [phone] ?

## 2022-01-09 NOTE — Telephone Encounter (Signed)
Spoke with patient and he is aware rx was sent. He also stated he is going to see one of the spine specialists at [facility] sometime next week. Patient stated he waited too long on his referral and needed to be seen. He thinks [place] for the help but everything took too long and he's in pain.  ?

## 2022-01-09 NOTE — Telephone Encounter (Signed)
Spoke with patient about message in another TE note.  ?

## 2022-01-10 ENCOUNTER — Telehealth: Payer: Self-pay | Admitting: Family Medicine

## 2022-01-10 MED ORDER — OXYCODONE-ACETAMINOPHEN 5-325 MG PO TABS
0.5000 | ORAL_TABLET | Freq: Four times a day (QID) | ORAL | 0 refills | Status: DC | PRN
Start: 2022-01-10 — End: 2022-03-20

## 2022-01-10 NOTE — Addendum Note (Signed)
Addended by: Tonia Ghent on: 01/10/2022 01:31 PM ? ? Modules accepted: Orders ? ?

## 2022-01-10 NOTE — Telephone Encounter (Signed)
Called and spoke with pharmacy about what they have in stock and they have hydrocodone 10-325mg  and oxycodone 5-325 mg tabs. Would you want to change his rx? ?

## 2022-01-10 NOTE — Telephone Encounter (Signed)
Patient notified of medication change and advised to start out taking 1/2 tab at a tab to get used to it. Patient verbalized understanding. ?

## 2022-01-10 NOTE — Telephone Encounter (Signed)
Timothy Bullock from [facility] called about needing to speak with the nurse about possibly switching medication, stated that they do not have this medication at the time: HYDROcodone-acetaminophen (NORCO/VICODIN) 5-325 MG tablet. ? ?Callback Number: Timothy Bullock: [phone] ?[id number] [place] [place] [address] ?

## 2022-01-10 NOTE — Telephone Encounter (Signed)
Would change to oxycodone.  New rx sent.  Caution patient to start with 1/2 tab at a time.  Thanks.  ?

## 2022-01-12 NOTE — Telephone Encounter (Addendum)
Duly noted.  Please see if you can find out when he is going to have an appointment at [facility].  Let me know when he needs another refill on his medication in the meantime.  Thanks. ?

## 2022-02-05 ENCOUNTER — Other Ambulatory Visit: Payer: Self-pay | Admitting: Family Medicine

## 2022-03-20 ENCOUNTER — Encounter: Payer: Self-pay | Admitting: Family Medicine

## 2022-03-20 ENCOUNTER — Ambulatory Visit (INDEPENDENT_AMBULATORY_CARE_PROVIDER_SITE_OTHER): Payer: Medicare HMO | Admitting: Family Medicine

## 2022-03-20 DIAGNOSIS — M79643 Pain in unspecified hand: Secondary | ICD-10-CM | POA: Diagnosis not present

## 2022-03-20 MED ORDER — GABAPENTIN 100 MG PO CAPS: 100.0000 mg | ORAL_CAPSULE | Freq: Every day | ORAL | 1 refills | Status: DC

## 2022-03-20 NOTE — Patient Instructions (Signed)
Try taking gabapentin at night and see if that helps with the pain.  Start with 1 tab at night.  Increase to 2 at night if tolerated and needed.  It not any better, then let me know.  We may need to see about getting your hand injected.  Take care.  Glad to see you.

## 2022-03-20 NOTE — Progress Notes (Signed)
L hand pain.  Going on for about 10 days.  Episodic pain.  Sharp pain when it happens, brief.  Had used hydrocodone once recently, had leftover rx for that.  May happen up to 12 times in a day.  He had palmar contracture on the L 4th/5th ray.  Pain at night is bothersome- it wakes him up.  No dorsal pain. No R hand or L foot sx.  No FCNAVD.  Independent with meals, showering, taking care of colostomy and urostomy.    His back pain is better.    Meds, vitals, and allergies reviewed.   ROS: Per HPI unless specifically indicated in ROS section   Nad Ncat Left hand with normal inspection except for mild palmar contraction of the left fourth/fifth ray.  Normal range of motion normal capillary refill.  No redness or erythema.  No tenderness at time of exam.  Normal sensation.  Tinel negative at the wrist.

## 2022-03-23 DIAGNOSIS — M79643 Pain in unspecified hand: Secondary | ICD-10-CM | POA: Insufficient documentation

## 2022-03-23 NOTE — Assessment & Plan Note (Signed)
It sounds like he has neuropathic pain without weakness or trauma.  Discussed options  Reasonable to try taking gabapentin at night and see if that helps with the pain.  Start with 1 tab at night.  Increase to 2 at night if tolerated and needed.  It not any better, then he can let me know.  We may need to see about getting his hand injected/reevaluated if not better.

## 2022-05-21 ENCOUNTER — Other Ambulatory Visit: Payer: Self-pay | Admitting: Family Medicine

## 2022-05-21 MED ORDER — GABAPENTIN 100 MG PO CAPS: 100.0000 mg | ORAL_CAPSULE | Freq: Every day | ORAL | 3 refills | Status: DC

## 2022-05-21 NOTE — Telephone Encounter (Signed)
  Encourage patient to contact the pharmacy for refills or they can request refills through [facility]  Did the patient contact the pharmacy: No  LAST APPOINTMENT DATE: 03/20/2022  NEXT APPOINTMENT DATE: N/A  MEDICATION: gabapentin (NEURONTIN) 100 MG capsule  Is the patient out of medication? Yes  [id number] [place] [place] [address]  Let patient know to contact pharmacy at the end of the day to make sure medication is ready.  Please notify patient to allow 48-72 hours to process

## 2022-05-21 NOTE — Telephone Encounter (Signed)
Last refill 03/20/22 #60/1 Last office visit 03/20/22

## 2022-06-15 ENCOUNTER — Other Ambulatory Visit: Payer: Self-pay | Admitting: Family Medicine

## 2022-08-12 ENCOUNTER — Encounter: Payer: Self-pay | Admitting: Family Medicine

## 2022-08-12 ENCOUNTER — Ambulatory Visit (INDEPENDENT_AMBULATORY_CARE_PROVIDER_SITE_OTHER): Payer: Medicare HMO | Admitting: Family Medicine

## 2022-08-12 ENCOUNTER — Telehealth: Payer: Self-pay | Admitting: Family Medicine

## 2022-08-12 VITALS — BP 102/60 | HR 87 | Temp 97.3°F | Ht 69.0 in | Wt 155.0 lb

## 2022-08-12 DIAGNOSIS — L089 Local infection of the skin and subcutaneous tissue, unspecified: Secondary | ICD-10-CM | POA: Diagnosis not present

## 2022-08-12 DIAGNOSIS — Z23 Encounter for immunization: Secondary | ICD-10-CM

## 2022-08-12 DIAGNOSIS — J340 Abscess, furuncle and carbuncle of nose: Secondary | ICD-10-CM

## 2022-08-12 MED ORDER — AMOXICILLIN-POT CLAVULANATE 875-125 MG PO TABS: 1.0000 | ORAL_TABLET | Freq: Two times a day (BID) | ORAL | 0 refills | Status: DC

## 2022-08-12 NOTE — Telephone Encounter (Signed)
Patient cannot get in to the dentist until September 29, 2022. Is this okay or does he need to go somewhere else?   Please call patient at [phone]

## 2022-08-12 NOTE — Addendum Note (Signed)
Addended by: Tonia Ghent on: 08/12/2022 02:13 PM   Modules accepted: Orders

## 2022-08-12 NOTE — Telephone Encounter (Signed)
Called and spoke with patient and he is fine with doing CT scan. Advised patient to call back tomorrow with update on how he is doing.

## 2022-08-12 NOTE — Patient Instructions (Addendum)
Start the antibiotics today.    Please check with the dental clinic this AM. Tell them you have pus draining from your gum line above the left upper canine.    If they can't address this in the very near future or if you have a fever/increasing pain, then let me know.    Either way, update me in 1-2 days.    Take care.  Glad to see you.

## 2022-08-12 NOTE — Progress Notes (Unsigned)
Still with episodic hand pain, not all of the time.  He is managing as is.  Gabapentin helped a little.  Occ brief episodes of pain.   No new meds. L facial swelling, started about 3 weeks ago.  He can feel a small puffy area on the L maxillary area.  No rash.  No tooth pain.  Maxillary pain with chewing, but not tooth pain.  No TMJ area pain.  No fevers, no chills.  No R sided sx.  No tongue or lip swelling.  No trauma, no dental trauma.    Meds, vitals, and allergies reviewed.   ROS: Per HPI unless specifically indicated in ROS section   Nad Ncat except for L max area puffy.   No lip or tongue swelling.  pus draining from the gum line above the left upper canine-this can be gently expressed from the area.  The tooth still appears intact on gross inspection. Neck supple.  No lymphadenopathy.

## 2022-08-12 NOTE — Telephone Encounter (Signed)
I would get the CT done.  I put in the order.  Have him let me know if he doesn't get a call about getting that scheduled in the next 24 hours.  And I would like an update from patient about his condition tomorrow.  Thanks.

## 2022-08-13 ENCOUNTER — Ambulatory Visit
Admission: RE | Admit: 2022-08-13 | Discharge: 2022-08-13 | Disposition: A | Discharge status: Home / Self Care | Payer: Medicare HMO | Source: Ambulatory Visit | Attending: Family Medicine | Admitting: Family Medicine

## 2022-08-13 ENCOUNTER — Telehealth: Payer: Self-pay | Admitting: Family Medicine

## 2022-08-13 DIAGNOSIS — L089 Local infection of the skin and subcutaneous tissue, unspecified: Secondary | ICD-10-CM | POA: Insufficient documentation

## 2022-08-13 NOTE — Assessment & Plan Note (Signed)
Unclear if it involves the sinus cavity.  I asked him to check with the dental clinic.  He was not able to be evaluated today.  The plan is to start Augmentin and then get CT done.  See results on CT.  Routine cautions given to patient.  Okay for outpatient follow-up.

## 2022-08-13 NOTE — Telephone Encounter (Signed)
Patient came in and wanted to know about his CT scan results. Informed patient Dr. Damita Dunnings wasn't in today. Thank you!

## 2022-08-13 NOTE — Telephone Encounter (Signed)
See result note.  Thanks!

## 2022-08-21 ENCOUNTER — Ambulatory Visit: Payer: Medicare HMO

## 2022-09-04 ENCOUNTER — Ambulatory Visit (INDEPENDENT_AMBULATORY_CARE_PROVIDER_SITE_OTHER): Payer: Medicare HMO | Admitting: Family Medicine

## 2022-09-04 ENCOUNTER — Encounter: Payer: Self-pay | Admitting: Family Medicine

## 2022-09-04 VITALS — BP 118/76 | HR 107 | Temp 97.6°F | Ht 69.0 in | Wt 158.5 lb

## 2022-09-04 DIAGNOSIS — L089 Local infection of the skin and subcutaneous tissue, unspecified: Secondary | ICD-10-CM

## 2022-09-04 MED ORDER — AMOXICILLIN-POT CLAVULANATE 875-125 MG PO TABS: 1.0000 | ORAL_TABLET | Freq: Two times a day (BID) | ORAL | 0 refills | Status: DC

## 2022-09-04 NOTE — Progress Notes (Signed)
Subjective:    Patient ID: Timothy Bullock, male    DOB: [birth year], 88 y.o.   MRN: [id number]  HPI 88 yo pt of Dr Damita Dunnings is here for f/u of infection of face   Wt Readings from Last 3 Encounters:  09/04/22 158 lb 8 oz (71.9 kg)  08/12/22 155 lb (70.3 kg)  03/20/22 152 lb (68.9 kg)   23.41 kg/m   Pt was seen 11/28 for a facial infection / ? From dental problem  Was tx with augmentin  Had a CT scan  Has not been to the dentist  Is going tomorrow   Thinks his symptoms are coming back Feels a lump in his face  Got better until he finished his abx Discomfort in L cheek   No fever  Feels ok     CT Maxillofacial WO CM  Result Date: 08/13/2022 CLINICAL DATA:  Nasal abscess.  Facial infection. EXAM: CT MAXILLOFACIAL WITHOUT CONTRAST TECHNIQUE: Multidetector CT imaging of the maxillofacial structures was performed. Multiplanar CT image reconstructions were also generated. RADIATION DOSE REDUCTION: This exam was performed according to the departmental dose-optimization program which includes automated exposure control, adjustment of the mA and/or kV according to patient size and/or use of iterative reconstruction technique. COMPARISON:  None Available. FINDINGS: Osseous: Severe artifact from dental amalgam. There is a notable periapical lucency around the tooth 11 with labial sided dehiscence and adjacent soft tissue thickening and fat loss. Presence of a small subperiosteal abscess is indeterminate without contrast. Orbits: Negative Sinuses: Essentially clear Soft tissues: As above.  No other areas of inflammation noted. Limited intracranial: Unremarkable for age IMPRESSION: Evidence of odontogenic infection related to tooth 11 which has a periapical lucency dehiscent along the labial surface. Cannot exclude small subperiosteal abscess in the setting of noncontrast technique. Electronically Signed   By: Jorje Guild M.D.   On: 08/13/2022 09:11     Patient Active Problem List    Diagnosis Date Noted   Facial infection 08/13/2022   Hand pain 03/23/2022   Thoracic back pain 12/18/2021   Back muscle spasm 12/09/2021   RLS (restless legs syndrome) 11/04/2020   Medicare annual wellness visit, subsequent 06/29/2019   Lumbar back pain 02/12/2018   Advance care planning 10/29/2017   Hypertension 10/29/2017   History of atrial fibrillation 10/29/2017   Attention to urostomy [facility]) 03/08/2017   Colostomy status ([place]) 12/31/2016   History of urostomy 12/31/2016   Anemia 08/19/2016   Bladder cancer ([place]) 04/18/2016   Loss of weight 01/03/2016   Personal history of prostate cancer 07/13/2013   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) 07/22/2012   Paroxysmal atrial fibrillation ([place]) 05/28/2012   GASTRIC ULCER 07/23/2010   Past Medical History:  Diagnosis Date   ARF (acute renal failure) ([place])    Bladder cancer ([place])    Blood in stool    Blood transfusion    Autologous donation with prostate surgery   Diverticulitis    H/O cardiac radiofrequency ablation    HTN (hypertension)    Paroxysmal atrial fibrillation ([place]) 05/28/2012   s/p ablation   Prostate cancer [facility])    s/p resection   Ulcer    gastric   Urinary incontinence    Past Surgical History:  Procedure Laterality Date   BLADDER REMOVAL     CARDIAC CATHETERIZATION  2013   [place]   COLONOSCOPY  02/2012   negative pathology    COLOSTOMY     with bladder cancer surgery 2017   OTHER SURGICAL HISTORY  urostomy   PERCUTANEOUS NEPHROSTOMY  2017   PROSTATECTOMY  1993   Social History   Tobacco Use   Smoking status: Former    Types: Cigarettes    Quit date: 09/15/1965    Years since quitting: 57.0   Smokeless tobacco: Never  Substance Use Topics   Alcohol use: Yes    Alcohol/week: 0.0 standard drinks of alcohol    Comment: Rare   Drug use: No   Family History  Problem Relation Age of Onset   Cancer Mother        H/O colon CA   Alcohol abuse Father    Cancer Father        CA of tongue    Cancer Other        colon, parents <60   No Known Allergies Current Outpatient Medications on File Prior to Visit  Medication Sig Dispense Refill   [facility] SENNA 8.6 MG tablet TAKE 2 TABLETS BY MOUTH AT BEDTIME AS NEEDED FOR CONSTIPATION 60 tablet 3   ELIQUIS 5 MG TABS tablet TAKE 1 TABLET BY MOUTH TWO  TIMES DAILY 180 tablet 0   gabapentin (NEURONTIN) 100 MG capsule Take 1-2 capsules (100-200 mg total) by mouth at bedtime. 60 capsule 3   metoprolol tartrate (LOPRESSOR) 25 MG tablet Take 0.5 tablets (12.5 mg total) by mouth 2 (two) times daily as needed (for heart racing.). 60 tablet 3   omeprazole (PRILOSEC) 20 MG capsule Take 20 mg by mouth daily.      No current facility-administered medications on file prior to visit.     Review of Systems  Constitutional:  Negative for appetite change, fatigue and fever.  HENT:  Positive for dental problem. Negative for drooling, ear pain, postnasal drip, rhinorrhea, sore throat and trouble swallowing.   Skin:  Negative for color change.  Neurological:  Negative for dizziness and facial asymmetry.       Objective:   Physical Exam Constitutional:      General: He is not in acute distress.    Appearance: Normal appearance. He is not ill-appearing or diaphoretic.  HENT:     Head: Normocephalic and atraumatic.     Nose: No congestion or rhinorrhea.     Mouth/Throat:     Mouth: Mucous membranes are moist.     Pharynx: No oropharyngeal exudate or posterior oropharyngeal erythema.     Comments: Mild inflammation around L upper molar - mild tenderness noted    Small area of soft tissue swelling in L cheek just lateral to nose  Mildly tender No fluctuance No erythema or warmth Eyes:     General:        Right eye: No discharge.        Left eye: No discharge.     Extraocular Movements: Extraocular movements intact.     Conjunctiva/sclera: Conjunctivae normal.     Pupils: Pupils are equal, round, and reactive to light.  Cardiovascular:     Rate  and Rhythm: Tachycardia present.  Pulmonary:     Effort: Pulmonary effort is normal. No respiratory distress.     Breath sounds: Normal breath sounds. No wheezing or rales.  Musculoskeletal:     Cervical back: Neck supple.  Lymphadenopathy:     Cervical: No cervical adenopathy.  Skin:    General: Skin is warm and dry.     Findings: No erythema.  Neurological:     Mental Status: He is alert.     Cranial Nerves: No cranial nerve deficit.  Psychiatric:  Mood and Affect: Mood normal.           Assessment & Plan:   Problem List Items Addressed This Visit       Other   Facial infection - Primary    Pt is here for f/u- he finished the augmentin and the facial swelling is starting to come back though mild  Rev notes from Dr Damita Dunnings Rev CT report indicating odontogenic infx related to tooth 11 and likely abscess  Reassuring exam today  Px repeat course of augmentin  Sees dentist early tom am  Rev ER precautions in detail

## 2022-09-04 NOTE — Assessment & Plan Note (Signed)
Pt is here for f/u- he finished the augmentin and the facial swelling is starting to come back though mild  Rev notes from Dr Damita Dunnings Rev CT report indicating odontogenic infx related to tooth 11 and likely abscess  Reassuring exam today  Px repeat course of augmentin  Sees dentist early tom am  Rev ER precautions in detail

## 2022-09-04 NOTE — Patient Instructions (Signed)
You can try a warm salt water swish in the mouth   Take the augmentin as directed  See the dentist tomorrow as planned  In the meantime let us know if symptoms worsen  Watch for a fever or worse facial swelling

## 2022-10-17 ENCOUNTER — Other Ambulatory Visit: Payer: Self-pay | Admitting: Family Medicine

## 2022-11-02 ENCOUNTER — Other Ambulatory Visit: Payer: Self-pay | Admitting: Family Medicine

## 2022-11-03 NOTE — Telephone Encounter (Signed)
Refill request for GABAPENTIN 100 MG CAPSULE   LOV - 09/04/22 Next OV - not scheduled Last refill - 05/21/22 #60/3

## 2022-11-05 ENCOUNTER — Ambulatory Visit (INDEPENDENT_AMBULATORY_CARE_PROVIDER_SITE_OTHER): Payer: Medicare HMO

## 2022-11-05 VITALS — Ht 70.0 in | Wt 158.0 lb

## 2022-11-05 DIAGNOSIS — Z0000 Encounter for general adult medical examination without abnormal findings: Secondary | ICD-10-CM | POA: Diagnosis not present

## 2022-11-05 NOTE — Progress Notes (Signed)
I connected with  Timothy Bullock on 11/05/22 by telephone and verified that I am speaking with the correct person using two identifiers.  Patient Location: Home  Provider Location: Office/Clinic  I discussed the limitations of evaluation and management by telemedicine. The patient expressed understanding and agreed to proceed.  Subjective:   Timothy Bullock is a 89 y.o. male who presents for Medicare Annual/Subsequent preventive examination.  Review of Systems     Cardiac Risk Factors include: advanced age (>47men, >84 women);hypertension;male gender    Objective:    Today's Vitals   11/05/22 0920  Weight: 158 lb (71.7 kg)  Height: 5\' 10"  (1.778 m)   Body mass index is 22.67 kg/m.     11/05/2022    9:36 AM 08/12/2016    2:52 PM 07/25/2016    7:45 AM 03/22/2016   10:01 AM  Advanced Directives  Does Patient Have a Medical Advance Directive? Yes Yes Yes Yes  Type of Paramedic of [place];Living will Living will [place];Living will  Does patient want to make changes to medical advance directive?   No - Patient declined   Copy of [place] in Chart? No - copy requested No - copy requested No - copy requested     Current Medications (verified) Outpatient Encounter Medications as of 11/05/2022  Medication Sig   ELIQUIS 5 MG TABS tablet TAKE 1 TABLET BY MOUTH TWO  TIMES DAILY   amoxicillin-clavulanate (AUGMENTIN) 875-125 MG tablet Take 1 tablet by mouth 2 (two) times daily. (Patient not taking: Reported on 11/05/2022)   [facility] SENNA 8.6 MG tablet TAKE 2 TABLETS BY MOUTH AT BEDTIME AS NEEDED FOR CONSTIPATION (Patient not taking: Reported on 11/05/2022)   gabapentin (NEURONTIN) 100 MG capsule TAKE 1-2 CAPSULES (100-200 MG TOTAL) BY MOUTH AT BEDTIME. (Patient not taking: Reported on 11/05/2022)   metoprolol tartrate (LOPRESSOR) 25 MG tablet Take 0.5 tablets (12.5 mg total) by mouth 2 (two)  times daily as needed (for heart racing.). (Patient not taking: Reported on 11/05/2022)   omeprazole (PRILOSEC) 20 MG capsule Take 20 mg by mouth daily.  (Patient not taking: Reported on 11/05/2022)   No facility-administered encounter medications on file as of 11/05/2022.    Allergies (verified) Patient has no known allergies.   History: Past Medical History:  Diagnosis Date   ARF (acute renal failure) ([place])    Bladder cancer ([place])    Blood in stool    Blood transfusion    Autologous donation with prostate surgery   Diverticulitis    H/O cardiac radiofrequency ablation    HTN (hypertension)    Paroxysmal atrial fibrillation ([place]) 05/28/2012   s/p ablation   Prostate cancer [facility])    s/p resection   Ulcer    gastric   Urinary incontinence    Past Surgical History:  Procedure Laterality Date   BLADDER REMOVAL     CARDIAC CATHETERIZATION  2013   [place]   COLONOSCOPY  02/2012   negative pathology    COLOSTOMY     with bladder cancer surgery 2017   OTHER SURGICAL HISTORY     urostomy   PERCUTANEOUS NEPHROSTOMY  2017   PROSTATECTOMY  1993   Family History  Problem Relation Age of Onset   Cancer Mother        H/O colon CA   Alcohol abuse Father    Cancer Father        CA of tongue  Cancer Other        colon, parents <60   Social History   Socioeconomic History   Marital status: Widowed    Spouse name: Not on file   Number of children: Not on file   Years of education: Not on file   Highest education level: Not on file  Occupational History   Occupation: Retired  Tobacco Use   Smoking status: Former    Types: Cigarettes    Quit date: 09/15/1965    Years since quitting: 57.1   Smokeless tobacco: Never  Substance and Sexual Activity   Alcohol use: Yes    Alcohol/week: 0.0 standard drinks of alcohol    Comment: Rare   Drug use: No   Sexual activity: Never  Other Topics Concern   Not on file  Social History Narrative   Widowed 2021------ was married in 1982.    High school grad   Regular exercise:  Yes, walking   From [place]   [place].  Social research officer, government, Holiday representative second class ([place] for 13 months)   Social Determinants of Health   Financial Resource Strain: Low Risk  (11/05/2022)   Overall Financial Resource Strain (CARDIA)    Difficulty of Paying Living Expenses: Not hard at all  Food Insecurity: No Food Insecurity (11/05/2022)   Hunger Vital Sign    Worried About Running Out of Food in the Last Year: Never true    [place] in the Last Year: Never true  Transportation Needs: No Transportation Needs (11/05/2022)   PRAPARE - Hydrologist (Medical): No    Lack of Transportation (Non-Medical): No  Physical Activity: Sufficiently Active (11/05/2022)   Exercise Vital Sign    Days of Exercise per Week: 6 days    Minutes of Exercise per Session: 90 min  Stress: No Stress Concern Present (11/05/2022)   [place]    Feeling of Stress : Not at all  Social Connections: Moderately Isolated (11/05/2022)   Social Connection and Isolation Panel [NHANES]    Frequency of Communication with Friends and Family: More than three times a week    Frequency of Social Gatherings with Friends and Family: More than three times a week    Attends Religious Services: More than 4 times per year    Active Member of [employer] or Organizations: No    Attends Archivist Meetings: Never    Marital Status: Widowed    Tobacco Counseling Counseling given: Not Answered   Clinical Intake:  Pre-visit preparation completed: Yes  Pain : No/denies pain     BMI - recorded: 22.67 Nutritional Status: BMI of 19-24  Normal Nutritional Risks: None Diabetes: No  How often do you need to have someone help you when you read instructions, pamphlets, or other written materials from your doctor or pharmacy?: 1 - Never  Diabetic?no  Interpreter Needed?: No  Information  entered by :: B.Delvecchio Madole,LPN   Activities of Daily Living    11/05/2022    9:36 AM  In your present state of health, do you have any difficulty performing the following activities:  Hearing? 1  Comment pt has hearing aides but does not hear well  Vision? 0  Difficulty concentrating or making decisions? 0  Walking or climbing stairs? 0  Dressing or bathing? 0  Doing errands, shopping? 0  Preparing Food and eating ? N  Using the Toilet? N  In the past six months, have  you accidently leaked urine? N  Do you have problems with loss of bowel control? N  Managing your Medications? N  Managing your Finances? N  Housekeeping or managing your Housekeeping? N    Patient Care Team: Tonia Ghent, MD as PCP - General Vickie Epley, MD as PCP - Electrophysiology (Cardiology) Wellington Hampshire, MD as Attending Physician (Cardiology) Lurline Del, MD as Referring Physician (Cardiology) Berkley Harvey, MD as Consulting Physician (Internal Medicine) [facility] (Cardiology) Lisbeth Renshaw, M.D. (Cardiology)  Indicate any recent Medical Services you may have received from other than [facility] providers in the past year (date may be approximate).     Assessment:   This is a routine wellness examination for [place].  Hearing/Vision screen Hearing Screening - Comments:: Pt has 2 hearing aides but does not hear well with them Vision Screening - Comments:: Adequate vision w/glasses sees eye provider in [place] issues and exercise activities discussed: Current Exercise Habits: Home exercise routine, Type of exercise: walking, Time (Minutes): 40, Frequency (Times/Week): 6, Weekly Exercise (Minutes/Week): 240, Intensity: Mild, Exercise limited by: None identified   Goals Addressed             This Visit's Progress    Increase physical activity   On track    Starting 08/12/2016, I will continue to walk at least 60 min 3 days per week.        Depression Screen     11/05/2022    9:25 AM 10/14/2021   11:01 AM 10/27/2017   10:23 AM 08/12/2016    2:52 PM  PHQ 2/9 Scores  PHQ - 2 Score 0 0 0 0    Fall Risk    11/05/2022    9:22 AM 08/12/2022    8:17 AM 11/02/2020   10:38 AM 06/27/2019   11:56 AM 10/27/2017   10:23 AM  Fall Risk   Falls in the past year? 0 0 0 0 No  Number falls in past yr: 0 0 0    Injury with Fall? 0 0 0    Risk for fall due to : No Fall Risks History of fall(s)     Follow up Education provided;Falls prevention discussed Falls evaluation completed Falls evaluation completed Falls evaluation completed     FALL RISK PREVENTION PERTAINING TO THE HOME:  Any stairs in or around the home? No  If so, are there any without handrails? No  Home free of loose throw rugs in walkways, pet beds, electrical cords, etc? yes Adequate lighting in your home to reduce risk of falls? Yes   ASSISTIVE DEVICES UTILIZED TO PREVENT FALLS:  Life alert? Yes  Use of a cane, walker or w/c? Yes rollater when walkking a lot. Tobacco Use: Medium Risk (11/05/2022)   Patient History    Smoking Tobacco Use: Former    Smokeless Tobacco Use: Never    Passive Exposure: Not on file    Grab bars in the bathroom? Yes  Shower chair or bench in shower? Yes  Elevated toilet seat or a handicapped toilet? Yes    Cognitive Function:    08/12/2016    3:04 PM  MMSE - Mini Mental State Exam  Orientation to time 5  Orientation to Place 5  Registration 3  Attention/ Calculation 0  Recall 1  Recall-comments pt was unable to recall 2 of 3 words  Language- name 2 objects 0  Language- repeat 1  Language- follow 3 step command 3  Language- read & follow  direction 0  Write a sentence 0  Copy design 0  Total score 18        11/05/2022    9:29 AM  6CIT Screen  What Year? 0 points  What month? 0 points  What time? 0 points  Count back from 20 0 points  Months in reverse 0 points  Repeat phrase 0 points  Total Score 0 points    Immunizations Immunization  History  Administered Date(s) Administered   Fluad Quad(high Dose 65+) 06/27/2019, 08/12/2022   Influenza Split 07/01/2011   Influenza Whole 07/23/2010   Influenza, High Dose Seasonal PF 09/11/2021   Influenza,inj,Quad PF,6+ Mos 06/23/2013, 08/17/2014, 07/03/2015, 06/05/2016, 06/02/2017, 09/09/2018   Influenza-Unspecified 06/27/2019   Moderna Sars-Covid-2 Vaccination 09/30/2019, 10/31/2019, 07/31/2020   Pneumococcal Polysaccharide-23 07/01/2011   Tdap 09/29/2021   Zoster, Live 02/12/2011    TDAP status: Up to date  Flu Vaccine status: Up to date  Pneumococcal vaccine status: Up to date  Covid-19 vaccine status: Completed vaccines  Qualifies for Shingles Vaccine? Yes   Zostavax completed Yes   Shingrix Completed?: Yes  Screening Tests Health Maintenance  Topic Date Due   Zoster Vaccines- Shingrix (1 of 2) Never done   Pneumonia Vaccine 79+ Years old (2 of 2 - PCV) 06/30/2012   COVID-19 Vaccine (4 - 2023-24 season) 05/16/2022   Medicare Annual Wellness (AWV)  11/06/2023   DTaP/Tdap/Td (2 - Td or Tdap) 09/30/2031   INFLUENZA VACCINE  Completed   HPV VACCINES  Aged Out    Health Maintenance  Health Maintenance Due  Topic Date Due   Zoster Vaccines- Shingrix (1 of 2) Never done   Pneumonia Vaccine 64+ Years old (2 of 2 - PCV) 06/30/2012   COVID-19 Vaccine (4 - 2023-24 season) 05/16/2022    Colorectal cancer screening: No longer required.   Lung Cancer Screening: (Low Dose CT Chest recommended if Age 39-80 years, 30 pack-year currently smoking OR have quit w/in 15years.) does not qualify.   Lung Cancer Screening Referral: no  Additional Screening:  Hepatitis C Screening: does not qualify; Completed no  Vision Screening: Recommended annual ophthalmology exams for early detection of glaucoma and other disorders of the eye. Is the patient up to date with their annual eye exam?  Yes  Who is the provider or what is the name of the office in which the patient attends  annual eye exams? [facility] in [place] If pt is not established with a provider, would they like to be referred to a provider to establish care? No .   Dental Screening: Recommended annual dental exams for proper oral hygiene  Community Resource Referral / Chronic Care Management: CRR required this visit?  No   CCM required this visit?  No      Plan:     I have personally reviewed and noted the following in the patient's chart:   Medical and social history Use of alcohol, tobacco or illicit drugs  Current medications and supplements including opioid prescriptions. Patient is not currently taking opioid prescriptions. Functional ability and status Nutritional status Physical activity Advanced directives List of other physicians Hospitalizations, surgeries, and ER visits in previous 12 months Vitals Screenings to include cognitive, depression, and falls Referrals and appointments  In addition, I have reviewed and discussed with patient certain preventive protocols, quality metrics, and best practice recommendations. A written personalized care plan for preventive services as well as general preventive health recommendations were provided to patient.     Roger Shelter, LPN  11/05/2022   Nurse Notes: pt sts he is doing well. He does relay that he wears hearing aides but they are not effective. I had to repeat myself loudly and several times when speaking to pt. Pt relays the maker of the hearing aides told him to send them back to inquire if they are defective. Pt states he has not done but will do. Pt has no other concerns or questions.

## 2022-11-05 NOTE — Patient Instructions (Signed)
Timothy Bullock , Thank you for taking time to come for your Medicare Wellness Visit. I appreciate your ongoing commitment to your health goals. Please review the following plan we discussed and let me know if I can assist you in the future.   These are the goals we discussed:  Goals      Increase physical activity     Starting 08/12/2016, I will continue to walk at least 60 min 3 days per week.         This is a list of the screening recommended for you and due dates:  Health Maintenance  Topic Date Due   Zoster (Shingles) Vaccine (1 of 2) Never done   Pneumonia Vaccine (2 of 2 - PCV) 06/30/2012   COVID-19 Vaccine (4 - 2023-24 season) 05/16/2022   Medicare Annual Wellness Visit  11/06/2023   DTaP/Tdap/Td vaccine (2 - Td or Tdap) 09/30/2031   Flu Shot  Completed   HPV Vaccine  Aged Out    Advanced directives: yes  Conditions/risks identified: none  Next appointment: Follow up in one year for your annual wellness visit. 11/11/2023 @9 :15am telephone  Preventive Care 65 Years and Older, Male  Preventive care refers to lifestyle choices and visits with your health care provider that can promote health and wellness. What does preventive care include? A yearly physical exam. This is also called an annual well check. Dental exams once or twice a year. Routine eye exams. Ask your health care provider how often you should have your eyes checked. Personal lifestyle choices, including: Daily care of your teeth and gums. Regular physical activity. Eating a healthy diet. Avoiding tobacco and drug use. Limiting alcohol use. Practicing safe sex. Taking low doses of aspirin every day. Taking vitamin and mineral supplements as recommended by your health care provider. What happens during an annual well check? The services and screenings done by your health care provider during your annual well check will depend on your age, overall health, lifestyle risk factors, and family history of  disease. Counseling  Your health care provider may ask you questions about your: Alcohol use. Tobacco use. Drug use. Emotional well-being. Home and relationship well-being. Sexual activity. Eating habits. History of falls. Memory and ability to understand (cognition). Work and work Statistician. Screening  You may have the following tests or measurements: Height, weight, and BMI. Blood pressure. Lipid and cholesterol levels. These may be checked every 5 years, or more frequently if you are over 67 years old. Skin check. Lung cancer screening. You may have this screening every year starting at age 70 if you have a 30-pack-year history of smoking and currently smoke or have quit within the past 15 years. Fecal occult blood test (FOBT) of the stool. You may have this test every year starting at age 24. Flexible sigmoidoscopy or colonoscopy. You may have a sigmoidoscopy every 5 years or a colonoscopy every 10 years starting at age 75. Prostate cancer screening. Recommendations will vary depending on your family history and other risks. Hepatitis C blood test. Hepatitis B blood test. Sexually transmitted disease (STD) testing. Diabetes screening. This is done by checking your blood sugar (glucose) after you have not eaten for a while (fasting). You may have this done every 1-3 years. Abdominal aortic aneurysm (AAA) screening. You may need this if you are a current or former smoker. Osteoporosis. You may be screened starting at age 82 if you are at high risk. Talk with your health care provider about your test results, treatment  options, and if necessary, the need for more tests. Vaccines  Your health care provider may recommend certain vaccines, such as: Influenza vaccine. This is recommended every year. Tetanus, diphtheria, and acellular pertussis (Tdap, Td) vaccine. You may need a Td booster every 10 years. Zoster vaccine. You may need this after age 52. Pneumococcal 13-valent  conjugate (PCV13) vaccine. One dose is recommended after age 58. Pneumococcal polysaccharide (PPSV23) vaccine. One dose is recommended after age 31. Talk to your health care provider about which screenings and vaccines you need and how often you need them. This information is not intended to replace advice given to you by your health care provider. Make sure you discuss any questions you have with your health care provider. Document Released: 09/28/2015 Document Revised: 05/21/2016 Document Reviewed: 07/03/2015 Elsevier Interactive Patient Education  2017 [place] Prevention in the Home Falls can cause injuries. They can happen to people of all ages. There are many things you can do to make your home safe and to help prevent falls. What can I do on the outside of my home? Regularly fix the edges of walkways and driveways and fix any cracks. Remove anything that might make you trip as you walk through a door, such as a raised step or threshold. Trim any bushes or trees on the path to your home. Use bright outdoor lighting. Clear any walking paths of anything that might make someone trip, such as rocks or tools. Regularly check to see if handrails are loose or broken. Make sure that both sides of any steps have handrails. Any raised decks and porches should have guardrails on the edges. Have any leaves, snow, or ice cleared regularly. Use sand or salt on walking paths during winter. Clean up any spills in your garage right away. This includes oil or grease spills. What can I do in the bathroom? Use night lights. Install grab bars by the toilet and in the tub and shower. Do not use towel bars as grab bars. Use non-skid mats or decals in the tub or shower. If you need to sit down in the shower, use a plastic, non-slip stool. Keep the floor dry. Clean up any water that spills on the floor as soon as it happens. Remove soap buildup in the tub or shower regularly. Attach bath mats  securely with double-sided non-slip rug tape. Do not have throw rugs and other things on the floor that can make you trip. What can I do in the bedroom? Use night lights. Make sure that you have a light by your bed that is easy to reach. Do not use any sheets or blankets that are too big for your bed. They should not hang down onto the floor. Have a firm chair that has side arms. You can use this for support while you get dressed. Do not have throw rugs and other things on the floor that can make you trip. What can I do in the kitchen? Clean up any spills right away. Avoid walking on wet floors. Keep items that you use a lot in easy-to-reach places. If you need to reach something above you, use a strong step stool that has a grab bar. Keep electrical cords out of the way. Do not use floor polish or wax that makes floors slippery. If you must use wax, use non-skid floor wax. Do not have throw rugs and other things on the floor that can make you trip. What can I do with my stairs? Do not  leave any items on the stairs. Make sure that there are handrails on both sides of the stairs and use them. Fix handrails that are broken or loose. Make sure that handrails are as long as the stairways. Check any carpeting to make sure that it is firmly attached to the stairs. Fix any carpet that is loose or worn. Avoid having throw rugs at the top or bottom of the stairs. If you do have throw rugs, attach them to the floor with carpet tape. Make sure that you have a light switch at the top of the stairs and the bottom of the stairs. If you do not have them, ask someone to add them for you. What else can I do to help prevent falls? Wear shoes that: Do not have high heels. Have rubber bottoms. Are comfortable and fit you well. Are closed at the toe. Do not wear sandals. If you use a stepladder: Make sure that it is fully opened. Do not climb a closed stepladder. Make sure that both sides of the stepladder  are locked into place. Ask someone to hold it for you, if possible. Clearly mark and make sure that you can see: Any grab bars or handrails. First and last steps. Where the edge of each step is. Use tools that help you move around (mobility aids) if they are needed. These include: Canes. Walkers. Scooters. Crutches. Turn on the lights when you go into a dark area. Replace any light bulbs as soon as they burn out. Set up your furniture so you have a clear path. Avoid moving your furniture around. If any of your floors are uneven, fix them. If there are any pets around you, be aware of where they are. Review your medicines with your doctor. Some medicines can make you feel dizzy. This can increase your chance of falling. Ask your doctor what other things that you can do to help prevent falls. This information is not intended to replace advice given to you by your health care provider. Make sure you discuss any questions you have with your health care provider. Document Released: 06/28/2009 Document Revised: 02/07/2016 Document Reviewed: 10/06/2014 Elsevier Interactive Patient Education  2017 [employer].

## 2022-11-26 ENCOUNTER — Other Ambulatory Visit: Payer: Self-pay

## 2022-11-26 ENCOUNTER — Emergency Department
Admission: EM | Admit: 2022-11-26 | Discharge: 2022-11-26 | Disposition: A | Discharge status: Home / Self Care | Payer: Medicare HMO | Attending: Emergency Medicine | Admitting: Emergency Medicine

## 2022-11-26 ENCOUNTER — Emergency Department: Payer: Medicare HMO

## 2022-11-26 ENCOUNTER — Encounter: Payer: Self-pay | Admitting: Intensive Care

## 2022-11-26 DIAGNOSIS — W1830XA Fall on same level, unspecified, initial encounter: Secondary | ICD-10-CM | POA: Diagnosis not present

## 2022-11-26 DIAGNOSIS — Z7901 Long term (current) use of anticoagulants: Secondary | ICD-10-CM | POA: Insufficient documentation

## 2022-11-26 DIAGNOSIS — Z8551 Personal history of malignant neoplasm of bladder: Secondary | ICD-10-CM | POA: Diagnosis not present

## 2022-11-26 DIAGNOSIS — I6782 Cerebral ischemia: Secondary | ICD-10-CM | POA: Diagnosis not present

## 2022-11-26 DIAGNOSIS — S4991XA Unspecified injury of right shoulder and upper arm, initial encounter: Secondary | ICD-10-CM | POA: Diagnosis present

## 2022-11-26 DIAGNOSIS — Y9373 Activity, racquet and hand sports: Secondary | ICD-10-CM | POA: Insufficient documentation

## 2022-11-26 DIAGNOSIS — S42211A Unspecified displaced fracture of surgical neck of right humerus, initial encounter for closed fracture: Secondary | ICD-10-CM | POA: Diagnosis not present

## 2022-11-26 DIAGNOSIS — I10 Essential (primary) hypertension: Secondary | ICD-10-CM | POA: Insufficient documentation

## 2022-11-26 MED ORDER — HYDROCODONE-ACETAMINOPHEN 5-325 MG PO TABS: 1.0000 | ORAL_TABLET | Freq: Three times a day (TID) | ORAL | 0 refills | Status: AC | PRN

## 2022-11-26 MED ORDER — HYDROCODONE-ACETAMINOPHEN 5-325 MG PO TABS
1.0000 | ORAL_TABLET | Freq: Once | ORAL | Status: AC
  Administered 2022-11-26: 1 via ORAL
  Filled 2022-11-26: qty 1

## 2022-11-26 NOTE — ED Provider Notes (Signed)
[facility] Emergency Department Provider Note     Event Date/Time   First MD Initiated Contact with Patient 11/26/22 1751     (approximate)   History   Shoulder Injury (right)   HPI  Timothy Bullock is a 89 y.o. male with a history of hypertension, A-fib on Eliquis, and bladder cancer, presents to the ED for evaluation of acute left shoulder injury.  Injury occurred while playing pickle ball.  He reports landing on his left shoulder.  No reports of any head injury or LOC.   Physical Exam   Triage Vital Signs: ED Triage Vitals [11/26/22 1432]  Enc Vitals Group     BP (!) 129/95     Pulse Rate 92     Resp 18     Temp 98 F (36.7 C)     Temp Source Oral     SpO2 96 %     Weight 155 lb (70.3 kg)     Height '5\' 10"'$  (1.778 m)     Head Circumference      Peak Flow      Pain Score 8     Pain Loc      Pain Edu?      Excl. in [place]?     Most recent vital signs: Vitals:   11/26/22 1432  BP: (!) 129/95  Pulse: 92  Resp: 18  Temp: 98 F (36.7 C)  SpO2: 96%    General Awake, no distress. NAD HEENT NCAT. PERRL. EOMI. No rhinorrhea. Mucous membranes are moist.  CV:  Good peripheral perfusion.  RESP:  Normal effort.  ABD:  No distention.  MSK:  Obvious deformity to the proximal right shoulder at the humerus.  Decreased range of motion.   ED Results / Procedures / Treatments   Labs (all labs ordered are listed, but only abnormal results are displayed) Labs Reviewed - No data to display   EKG   RADIOLOGY  I personally viewed and evaluated these images as part of my medical decision making, as well as reviewing the written report by the radiologist.  ED Provider Interpretation: comminuted humeral neck fracture  CT HEAD WO CONTRAST (5MM)  Result Date: 11/26/2022 CLINICAL DATA:  Trauma EXAM: CT HEAD WITHOUT CONTRAST TECHNIQUE: Contiguous axial images were obtained from the base of the skull through the vertex without intravenous  contrast. RADIATION DOSE REDUCTION: This exam was performed according to the departmental dose-optimization program which includes automated exposure control, adjustment of the mA and/or kV according to patient size and/or use of iterative reconstruction technique. COMPARISON:  None Available. FINDINGS: Brain: No evidence of acute infarction, hemorrhage, hydrocephalus, extra-axial collection or mass lesion/mass effect. There is mild diffuse atrophy and mild periventricular white matter hypodensity, likely chronic small vessel ischemic change. Vascular: Atherosclerotic calcifications are present within the cavernous internal carotid arteries. Skull: Normal. Negative for fracture or focal lesion. Sinuses/Orbits: There is mucosal thickening of ethmoid air cells and right frontal sinus. Other: None. IMPRESSION: 1. No acute intracranial process. 2. Mild diffuse atrophy and mild chronic small vessel ischemic change. 3. Paranasal sinus disease. Electronically Signed   By: Ronney Asters M.D.   On: 11/26/2022 18:30   CT SHOULDER RIGHT WO CONTRAST  Result Date: 11/26/2022 CLINICAL DATA:  Pickle ball injury, fell, fracture EXAM: CT OF THE UPPER RIGHT EXTREMITY WITHOUT CONTRAST TECHNIQUE: Multidetector CT imaging of the upper right extremity was performed according to the standard protocol. RADIATION DOSE REDUCTION: This exam was performed according to  the departmental dose-optimization program which includes automated exposure control, adjustment of the mA and/or kV according to patient size and/or use of iterative reconstruction technique. COMPARISON:  11/26/2022 FINDINGS: Bones/Joint/Cartilage There is marked osteopenia. A comminuted nondisplaced right humeral neck fracture is identified, with minimal impaction along the fracture line. There is extension of the fracture into the greater tuberosity, with approximately 9 mm of separation of the greater tuberosity fracture fragment. There are no other acute displaced  fractures. Mild acromioclavicular and glenohumeral joint osteoarthritis. Small right shoulder effusion. Ligaments Suboptimally assessed by CT. Muscles and Tendons No gross abnormality on this unenhanced CT. Mild edema within the deltoid may reflect sequela of direct trauma. Soft tissues Mild subcutaneous fat stranding overlying the right shoulder consistent with edema after direct trauma. The remaining visualized soft tissues are unremarkable. Limited imaging through the right lung demonstrates no acute findings. Reconstructed images demonstrate no additional findings. IMPRESSION: 1. Comminuted right humeral neck fracture, with minimal impaction along the fracture line. 2. The fracture line extends through the right greater tuberosity, with approximately 9 mm of displacement of the greater tuberosity fracture fragment. 3. Small right shoulder effusion. 4. Mild right acromioclavicular and glenohumeral joint osteoarthritis. Electronically Signed   By: Randa Ngo M.D.   On: 11/26/2022 18:26   DG Shoulder Right  Result Date: 11/26/2022 CLINICAL DATA:  Fall. EXAM: RIGHT SHOULDER - 2+ VIEW COMPARISON:  None Available. FINDINGS: Bones are diffusely demineralized. There is an acute fracture involving the right humeral neck, not well demonstrated due to the bony demineralization. Greater tuberosity may exist as a free fragment. Degenerative changes are seen in the glenohumeral joint. IMPRESSION: Acute comminuted fracture involving the right humeral neck, not well demonstrated due to the bony demineralization. Greater tuberosity may exist as a free fragment. CT imaging could be used to further characterize as clinically warranted. Electronically Signed   By: Misty Stanley M.D.   On: 11/26/2022 15:41     PROCEDURES:  Critical Care performed: No  Procedures   MEDICATIONS ORDERED IN ED: Medications  HYDROcodone-acetaminophen (NORCO/VICODIN) 5-325 MG per tablet 1 tablet (1 tablet Oral Given 11/26/22 1931)      IMPRESSION / MDM / ASSESSMENT AND PLAN / ED COURSE  I reviewed the triage vital signs and the nursing notes.                              Differential diagnosis includes, but is not limited to, shoulder strain, shoulder fracture, shoulder dislocation, closed head injury, SDH  Patient's presentation is most consistent with acute complicated illness / injury requiring diagnostic workup.  ----------------------------------------- 7:39 PM on 11/26/2022 ----------------------------------------- Spoke with Dr. Karel Jarvis related to the case.  Since the patient is more dynamically stable and neurovascularly intact, otherwise stable without other serious injury, he is agreeable to follow the patient as an out patient.  Patient will be placed in a shoulder sling/immobilizer.  Patient's diagnosis is consistent with acute comminuted fracture of the right humeral neck.  Fracture is confirmed on x-ray and CT, and reviewed by me as confirming fracture.  Patient with a negative head CT without evidence of closed head injury.  Patient will be discharged home with prescriptions for Vicodin. Patient is to follow up with Dr. Karel Jarvis as discussed, as needed or otherwise directed. Patient is given ED precautions to return to the ED for any worsening or new symptoms.   FINAL CLINICAL IMPRESSION(S) / ED DIAGNOSES   Final diagnoses:  Closed displaced fracture of surgical neck of right humerus, unspecified fracture morphology, initial encounter     Rx / DC Orders   ED Discharge Orders          Ordered    HYDROcodone-acetaminophen (NORCO) 5-325 MG tablet  3 times daily PRN        11/26/22 1921             Note:  This document was prepared using Dragon voice recognition software and may include unintentional dictation errors.    Melvenia Needles, PA-C 11/26/22 1951    Blake Divine, MD 11/26/22 2303

## 2022-11-26 NOTE — ED Triage Notes (Signed)
Patient was playing pickle ball and fell, landing on left shoulder.

## 2022-11-26 NOTE — Discharge Instructions (Addendum)
Your exam, XR, and CT scan confirms a fracture to the humerus. You will be placed in a shoulder immobilizer for support. You should only remove it to shower and dress. Apply ice compresses to reduce swelling. Follow-up with Dr Karel Jarvis in his office next week. Take the narcotic pain medicine as needed and OTC Tylenol for non-drowsy pain relief.

## 2022-11-26 NOTE — ED Notes (Signed)
Pt Dc to home after applying shoulder sling. DC instructions reviewed with all questions answered. Pt voices understanding. Pt assisted out of dept via wheelchair with support person

## 2022-11-27 ENCOUNTER — Telehealth: Payer: Self-pay

## 2022-11-27 NOTE — Telephone Encounter (Signed)
Please check with ortho to see if they can move his f/u OV sooner.  If not, then let me know/offer interval recheck here.  Thanks.

## 2022-11-27 NOTE — Telephone Encounter (Signed)
Pt called checking on status of ortho appt? Call back # [phone]

## 2022-11-27 NOTE — Transitions of Care (Post Inpatient/ED Visit) (Signed)
   11/27/2022  Name: Timothy Bullock MRN: [id number] DOB: Aug 01, 1932  Today's TOC FU Call Status: Today's TOC FU Call Status:: Successful TOC FU Call Competed TOC FU Call Complete Date: 11/27/22  Transition Care Management Follow-up Telephone Call Date of Discharge: 11/26/22 Discharge Facility: [facility]) Type of Discharge: Emergency Department Reason for ED Visit: Other: (Closed displaced fracture of surgical neck of right humerus,) How have you been since you were released from the hospital?: Better Any questions or concerns?: Yes Patient Questions/Concerns:: (S) pt needs referral to orthopaedist or who he can call to make fu appt- pt called Dr Karel Jarvis but cant be seen until 12-08-22- please advise  Items Reviewed: Did you receive and understand the discharge instructions provided?: Yes Medications obtained and verified?: Yes (Medications Reviewed) Any new allergies since your discharge?: No Dietary orders reviewed?: NA Do you have support at home?: Yes  Home Care and Equipment/Supplies: [place] Ordered?: No Any new equipment or medical supplies ordered?: No  Functional Questionnaire: Do you need assistance with bathing/showering or dressing?: No Do you need assistance with meal preparation?: No Do you need assistance with eating?: No Do you have difficulty maintaining continence: No Do you need assistance with getting out of bed/getting out of a chair/moving?: No Do you have difficulty managing or taking your medications?: No  Folllow up appointments reviewed: PCP Follow-up appointment confirmed?: No (specialist) MD Provider Line Number:[phone] Given: Yes [facility] Follow-up appointment confirmed?: No Reason Specialist Follow-Up Not Confirmed: Patient has Specialist Provider Number and will Call for Appointment Do you need transportation to your follow-up appointment?: No Do you understand care options if your  condition(s) worsen?: Yes-patient verbalized understanding    [place] LPN [place] Direct Dial [phone]

## 2022-11-28 NOTE — Telephone Encounter (Signed)
Called patient ortho doctors office but they were already closed for the day. Called patient but he did not answer and LMTCB.

## 2022-12-01 NOTE — Telephone Encounter (Signed)
Noted. Thanks.

## 2022-12-01 NOTE — Telephone Encounter (Signed)
Called orthopedic, they had no available appts before his schdeuled appt on 03/25. Called and informed patient, gave him the option to have interval appt here or [facility] walk[facility]. He declined and will wait until 03/25 to be seen.

## 2023-01-14 ENCOUNTER — Telehealth: Payer: Self-pay

## 2023-01-14 NOTE — Transitions of Care (Post Inpatient/ED Visit) (Signed)
   01/14/2023  Name: [name] MRN: [id number] DOB: 04/03/1932  Today's TOC FU Call Status: Today's TOC FU Call Status:: Unsuccessul Call (1st Attempt) Unsuccessful Call (1st Attempt) Date: 01/14/23  Attempted to reach the patient regarding the most recent Inpatient/ED visit.  Follow Up Plan: Additional outreach attempts will be made to reach the patient to complete the Transitions of Care (Post Inpatient/ED visit) call.   Signature  Fredirick Maudlin [facility]

## 2023-02-13 ENCOUNTER — Emergency Department
Admission: EM | Admit: 2023-02-13 | Discharge: 2023-02-13 | Disposition: A | Discharge status: Home / Self Care | Payer: Medicare HMO | Attending: Emergency Medicine | Admitting: Emergency Medicine

## 2023-02-13 ENCOUNTER — Encounter: Payer: Self-pay | Admitting: Emergency Medicine

## 2023-02-13 ENCOUNTER — Other Ambulatory Visit: Payer: Self-pay

## 2023-02-13 DIAGNOSIS — R319 Hematuria, unspecified: Secondary | ICD-10-CM | POA: Diagnosis present

## 2023-02-13 DIAGNOSIS — N390 Urinary tract infection, site not specified: Secondary | ICD-10-CM | POA: Diagnosis not present

## 2023-02-13 LAB — BASIC METABOLIC PANEL
Anion gap: 7 (ref 5–15)
BUN: 22 mg/dL (ref 8–23)
CO2: 25 mmol/L (ref 22–32)
Calcium: 8.8 mg/dL — ABNORMAL LOW (ref 8.9–10.3)
Chloride: 107 mmol/L (ref 98–111)
Creatinine, Ser: 1.21 mg/dL (ref 0.61–1.24)
GFR, Estimated: 58 mL/min — ABNORMAL LOW (ref >60)
Glucose, Bld: 99 mg/dL (ref 70–99)
Potassium: 4 mmol/L (ref 3.5–5.1)
Sodium: 139 mmol/L (ref 135–145)

## 2023-02-13 LAB — URINALYSIS, ROUTINE W REFLEX MICROSCOPIC
Bilirubin Urine: NEGATIVE
Glucose, UA: NEGATIVE mg/dL
Ketones, ur: NEGATIVE mg/dL
Nitrite: POSITIVE — AB
Protein, ur: 100 mg/dL — AB
RBC / HPF: >50 RBC/hpf (ref 0–5)
Specific Gravity, Urine: 1.014 (ref 1.005–1.030)
Squamous Epithelial / HPF: NONE SEEN /HPF (ref 0–5)
WBC, UA: >50 WBC/hpf (ref 0–5)
pH: 6 (ref 5.0–8.0)

## 2023-02-13 LAB — CBC
HCT: 37.9 % — ABNORMAL LOW (ref 39.0–52.0)
Hemoglobin: 12.4 g/dL — ABNORMAL LOW (ref 13.0–17.0)
MCH: 32.2 pg (ref 26.0–34.0)
MCHC: 32.7 g/dL (ref 30.0–36.0)
MCV: 98.4 fL (ref 80.0–100.0)
Platelets: 233 10*3/uL (ref 150–400)
RBC: 3.85 MIL/uL — ABNORMAL LOW (ref 4.22–5.81)
RDW: 13.9 % (ref 11.5–15.5)
WBC: 6.9 10*3/uL (ref 4.0–10.5)
nRBC: 0 % (ref 0.0–0.2)

## 2023-02-13 MED ORDER — CIPROFLOXACIN HCL 500 MG PO TABS: 500.0000 mg | ORAL_TABLET | Freq: Two times a day (BID) | ORAL | 0 refills | Status: DC

## 2023-02-13 MED ORDER — CIPROFLOXACIN HCL 500 MG PO TABS
500.0000 mg | ORAL_TABLET | Freq: Once | ORAL | Status: AC
  Administered 2023-02-13: 500 mg via ORAL
  Filled 2023-02-13: qty 1

## 2023-02-13 NOTE — ED Provider Notes (Signed)
[facility] Provider Note    Event Date/Time   First MD Initiated Contact with Patient 02/13/23 1415     (approximate)   History   Hematuria   HPI  Timothy Bullock is a 89 y.o. male with a history of urostomy and a colostomy who presents with hematuria in his urostomy bag.  He has noticed small amount of blood and dark yellow urine.  He has no fevers chills.  He feels well.  No weakness.  No abdominal pain, no complaints     Physical Exam   Triage Vital Signs: ED Triage Vitals  Enc Vitals Group     BP 02/13/23 1336 127/81     Pulse Rate 02/13/23 1336 94     Resp 02/13/23 1336 16     Temp 02/13/23 1336 97.7 F (36.5 C)     Temp Source 02/13/23 1336 Oral     SpO2 02/13/23 1336 96 %     Weight 02/13/23 1337 68 kg (150 lb)     Height 02/13/23 1337 1.778 m (5\' 10" )     Head Circumference --      Peak Flow --      Pain Score 02/13/23 1336 0     Pain Loc --      Pain Edu? --      Excl. in GC? --     Most recent vital signs: Vitals:   02/13/23 1336  BP: 127/81  Pulse: 94  Resp: 16  Temp: 97.7 F (36.5 C)  SpO2: 96%     General: Awake, no distress.  Well-appearing, sitting on the edge of the bed CV:  Good peripheral perfusion.  Resp:  Normal effort.  Abd:  No distention.  Soft Other:  Urostomy with yellow urine, small red clots noted, colostomy brown stool   ED Results / Procedures / Treatments   Labs (all labs ordered are listed, but only abnormal results are displayed) Labs Reviewed  URINALYSIS, ROUTINE W REFLEX MICROSCOPIC - Abnormal; Notable for the following components:      Result Value   Color, Urine AMBER (*)    APPearance CLOUDY (*)    Hgb urine dipstick LARGE (*)    Protein, ur 100 (*)    Nitrite POSITIVE (*)    Leukocytes,Ua MODERATE (*)    Bacteria, UA MANY (*)    All other components within normal limits  BASIC METABOLIC PANEL - Abnormal; Notable for the following components:   Calcium 8.8 (*)    GFR,  Estimated 58 (*)    All other components within normal limits  CBC - Abnormal; Notable for the following components:   RBC 3.85 (*)    Hemoglobin 12.4 (*)    HCT 37.9 (*)    All other components within normal limits     EKG     RADIOLOGY     PROCEDURES:  Critical Care performed:   Procedures   MEDICATIONS ORDERED IN ED: Medications  ciprofloxacin (CIPRO) tablet 500 mg (has no administration in time range)     IMPRESSION / MDM / ASSESSMENT AND PLAN / ED COURSE  I reviewed the triage vital signs and the nursing notes. Patient's presentation is most consistent with acute illness / injury with system symptoms.  Patient presents with hematuria as detailed above, well-appearing and in no acute distress.  Afebrile, normal blood pressure urostomy bag has yellow urine with small red clots noted.  Lab work reviewed and is quite reassuring, normal white blood cell  count.  Urinalysis is consistent with urinary tract infection, will start the patient on Cipro.  I discussed with the patient strict return precautions if any fevers chills weakness he needs to return to the emergency department for IV antibiotics, he agrees with this plan.        FINAL CLINICAL IMPRESSION(S) / ED DIAGNOSES   Final diagnoses:  Urinary tract infection with hematuria, site unspecified     Rx / DC Orders   ED Discharge Orders          Ordered    ciprofloxacin (CIPRO) 500 MG tablet  2 times daily,   Status:  Discontinued        02/13/23 1429    ciprofloxacin (CIPRO) 500 MG tablet  2 times daily        02/13/23 1430             Note:  This document was prepared using Dragon voice recognition software and may include unintentional dictation errors.   Jene Every, MD 02/13/23 1444

## 2023-02-13 NOTE — ED Triage Notes (Signed)
Pt to ED via POV. Pt states that he woke up this morning and noted that there was blood in his urostomy bag. Pt denies fever or chills. Pt is currently in NAD.

## 2023-02-16 ENCOUNTER — Telehealth: Payer: Self-pay

## 2023-02-16 NOTE — Transitions of Care (Post Inpatient/ED Visit) (Unsigned)
I spoke with pt; pt was seen in ED on 02/13/23 and started abx on 02/14/23. Pt has ileostomy bag and there is less concentration of blood in bag now. Pt to take cipro 500 mg bid for 10 days..pt also has colostomy bag and pt having less stool last 2days. Pt will try to drink more water.pt wanted to wait and schedule FU appt with Dr Para March when finishes abx.pt scheduled appt with Dr Para March on 02/24/23 at 9AM with UC & ED precautions. Sending note to Dr Para March.     02/16/2023  Name: Timothy Bullock MRN: [id number] DOB: [birth year]  Today's TOC FU Call Status: Today's TOC FU Call Status:: Successful TOC FU Call Competed Unsuccessful Call (1st Attempt) Date: 02/16/23  Transition Care Management Follow-up Telephone Call Date of Discharge: 02/13/23 Discharge Facility: [facility]) Type of Discharge: Emergency Department Reason for ED Visit: Other: (UTI; pt still has blood in ileostomy but  not as concentrated as Fri.) How have you been since you were released from the hospital?: Better Any questions or concerns?: No  Items Reviewed: Did you receive and understand the discharge instructions provided?: Yes Medications obtained,verified, and reconciled?: Yes (Medications Reviewed) Any new allergies since your discharge?: No Dietary orders reviewed?: NA Do you have support at home?: Yes People in Home: facility resident Name of Support/Comfort Primary Source: [place] resident in independent living pt can push buttonm and nurse from [facility] comes.  Medications Reviewed Today: Medications Reviewed Today     Reviewed by Lissa Hoard, PA-C (Physician Assistant Certified) on 11/26/22 at 1753  Med List Status: <None>   Medication Order Taking? Sig Documenting Provider Last Dose Status Informant  amoxicillin-clavulanate (AUGMENTIN) 875-125 MG tablet [id number]  Take 1 tablet by mouth 2 (two) times daily.  Patient not taking: Reported on 11/05/2022   Judy Pimple, MD  Active   [employer] SENNA 8.6 MG tablet [id number]  TAKE 2 TABLETS BY MOUTH AT BEDTIME AS NEEDED FOR CONSTIPATION  Patient not taking: Reported on 11/05/2022   Joaquim Nam, MD  Active   ELIQUIS 5 MG TABS tablet [id number]  TAKE 1 TABLET BY MOUTH TWO  TIMES DAILY Joaquim Nam, MD  Active   gabapentin (NEURONTIN) 100 MG capsule [id number]  TAKE 1-2 CAPSULES (100-200 MG TOTAL) BY MOUTH AT BEDTIME.  Patient not taking: Reported on 11/05/2022   Joaquim Nam, MD  Active   metoprolol tartrate (LOPRESSOR) 25 MG tablet [id number]  Take 0.5 tablets (12.5 mg total) by mouth 2 (two) times daily as needed (for heart racing.).  Patient not taking: Reported on 11/05/2022   Joaquim Nam, MD  Active   omeprazole (PRILOSEC) 20 MG capsule [id number]  Take 20 mg by mouth daily.   Patient not taking: Reported on 11/05/2022   [provider]  Active             Home Care and Equipment/Supplies: Were Home Health Services Ordered?: NA Any new equipment or medical supplies ordered?: NA  Functional Questionnaire: Do you need assistance with bathing/showering or dressing?: No Do you need assistance with meal preparation?: No Do you need assistance with eating?: No Do you have difficulty maintaining continence: No Do you need assistance with getting out of bed/getting out of a chair/moving?: No Do you have difficulty managing or taking your medications?: No  Follow up appointments reviewed: PCP Follow-up appointment confirmed?: Yes Date of PCP follow-up appointment?: 02/24/23 Follow-up Provider: Dr Cheree Ditto [facility]  Follow-up appointment confirmed?: NA Do you need transportation to your follow-up appointment?: No Do you understand care options if your condition(s) worsen?: Yes-patient verbalized understanding    SIGNATURE Lewanda Rife, LPN

## 2023-02-17 NOTE — Telephone Encounter (Signed)
Noted. Thanks.

## 2023-02-24 ENCOUNTER — Ambulatory Visit (INDEPENDENT_AMBULATORY_CARE_PROVIDER_SITE_OTHER): Payer: Medicare HMO | Admitting: Family Medicine

## 2023-02-24 ENCOUNTER — Encounter: Payer: Self-pay | Admitting: Family Medicine

## 2023-02-24 VITALS — BP 124/80 | HR 95 | Temp 97.5°F | Ht 70.0 in | Wt 154.0 lb

## 2023-02-24 DIAGNOSIS — R202 Paresthesia of skin: Secondary | ICD-10-CM | POA: Diagnosis not present

## 2023-02-24 DIAGNOSIS — R319 Hematuria, unspecified: Secondary | ICD-10-CM

## 2023-02-24 LAB — URINALYSIS, ROUTINE W REFLEX MICROSCOPIC
Bilirubin Urine: NEGATIVE
Hgb urine dipstick: NEGATIVE
Ketones, ur: NEGATIVE
Nitrite: NEGATIVE
RBC / HPF: NONE SEEN (ref 0–?)
Specific Gravity, Urine: 1.02 (ref 1.000–1.030)
Total Protein, Urine: NEGATIVE
Urine Glucose: NEGATIVE
Urobilinogen, UA: 1 (ref 0.0–1.0)
pH: 6 (ref 5.0–8.0)

## 2023-02-24 NOTE — Patient Instructions (Signed)
Recheck urine today.  I'll update Dr. Rosita Kea in the meantime.  Keep doing your hand exercises.   Take care.  Glad to see you.

## 2023-02-24 NOTE — Progress Notes (Unsigned)
ER eval d/w pt.  He had visible hematuria in his urostomy.  Done with cipro. No fevers.  No vomiting.  No more blood seen in bag.   H/o humeral fracture.  Had seen ortho, released from PT.  Was able to play ping pong yesterday.  Since the injury his hand goes to sleep at night.  He had weakness in the R thumb with thumb flexion- thumb extension is still good.  Still with altered sensation in the R fingers during the day, noted more at night.  Altered sensation on thumb > other fingers.    Meds, vitals, and allergies reviewed.   ROS: Per HPI unless specifically indicated in ROS section      IRR, recheck pulse averaging 95  R thumb weaker with flexion but not extension.   Altered but not absent sensation on the R thumb palmar side R wrist flexor tinel neg.    I need to update Dr. Rosita Kea re: hand.

## 2023-02-25 ENCOUNTER — Other Ambulatory Visit: Payer: Self-pay | Admitting: Family Medicine

## 2023-02-25 ENCOUNTER — Telehealth: Payer: Self-pay | Admitting: Family Medicine

## 2023-02-25 DIAGNOSIS — R202 Paresthesia of skin: Secondary | ICD-10-CM | POA: Insufficient documentation

## 2023-02-25 DIAGNOSIS — R319 Hematuria, unspecified: Secondary | ICD-10-CM | POA: Insufficient documentation

## 2023-02-25 NOTE — Telephone Encounter (Signed)
R thumb weaker with flexion but extension is normal.  No tendon deficit with flexion or extension. Altered but not absent sensation on the R thumb palmar side R wrist flexor tinel neg.    Please call and update Dr. Rosita Kea with orthopedics re: hand and ask for input.

## 2023-02-25 NOTE — Assessment & Plan Note (Signed)
Of the right hand. R thumb weaker with flexion but extension is normal.  No tendon deficit with flexion or extension. Altered but not absent sensation on the R thumb palmar side R wrist flexor tinel neg.    I will update Dr. Rosita Kea re: hand and ask for input.

## 2023-02-25 NOTE — Telephone Encounter (Signed)
Message has been sent to Dr. Rosita Kea staff. Will await call back.

## 2023-02-25 NOTE — Assessment & Plan Note (Signed)
Recheck urinalysis today.  Done with antibiotics.  Benign abdominal exam.

## 2023-02-26 ENCOUNTER — Telehealth: Payer: Self-pay | Admitting: Family Medicine

## 2023-02-26 NOTE — Telephone Encounter (Signed)
Patient dropped off folder for Dr. Para March, placed in provider's tray in front office

## 2023-02-26 NOTE — Telephone Encounter (Signed)
Folder placed in Dr. Duncan's in box. 

## 2023-02-28 ENCOUNTER — Other Ambulatory Visit: Payer: Self-pay | Admitting: Family Medicine

## 2023-03-01 NOTE — Telephone Encounter (Signed)
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  It was an exceedingly kind gesture.

## 2023-03-04 NOTE — Telephone Encounter (Signed)
Called Dr. Rosita Kea office and left message with nurse about patients hand. Ask for call back with input from Dr. Rosita Kea.

## 2023-03-11 NOTE — Telephone Encounter (Signed)
Per Dr. Stark Bray him yesterday and EMG ordered.  App input from Dr. Rosita Kea.

## 2023-03-31 ENCOUNTER — Ambulatory Visit: Payer: Medicare HMO | Admitting: Family Medicine

## 2023-04-06 ENCOUNTER — Telehealth: Payer: Self-pay

## 2023-05-05 ENCOUNTER — Ambulatory Visit (INDEPENDENT_AMBULATORY_CARE_PROVIDER_SITE_OTHER): Payer: Medicare HMO | Admitting: Family Medicine

## 2023-05-05 ENCOUNTER — Encounter: Payer: Self-pay | Admitting: Family Medicine

## 2023-05-05 VITALS — BP 138/84 | HR 88 | Temp 98.3°F | Ht 70.0 in | Wt 156.6 lb

## 2023-05-05 DIAGNOSIS — G5600 Carpal tunnel syndrome, unspecified upper limb: Secondary | ICD-10-CM

## 2023-05-05 DIAGNOSIS — Z436 Encounter for attention to other artificial openings of urinary tract: Secondary | ICD-10-CM

## 2023-05-05 DIAGNOSIS — Z8679 Personal history of other diseases of the circulatory system: Secondary | ICD-10-CM

## 2023-05-05 DIAGNOSIS — Z933 Colostomy status: Secondary | ICD-10-CM | POA: Diagnosis not present

## 2023-05-05 DIAGNOSIS — Z0000 Encounter for general adult medical examination without abnormal findings: Secondary | ICD-10-CM

## 2023-05-05 DIAGNOSIS — Z7189 Other specified counseling: Secondary | ICD-10-CM

## 2023-05-05 DIAGNOSIS — D649 Anemia, unspecified: Secondary | ICD-10-CM

## 2023-05-05 DIAGNOSIS — I480 Paroxysmal atrial fibrillation: Secondary | ICD-10-CM | POA: Diagnosis not present

## 2023-05-05 MED ORDER — APIXABAN 5 MG PO TABS
5.0000 mg | ORAL_TABLET | Freq: Two times a day (BID) | ORAL | 3 refills | Status: DC
Start: 2023-05-05 — End: 2024-05-31

## 2023-05-05 MED ORDER — SENNOSIDES 8.6 MG PO TABS: 2.0000 | ORAL_TABLET | Freq: Every day | ORAL | Status: AC

## 2023-05-05 NOTE — Patient Instructions (Addendum)
Try using the brace at night and see if that helps.  Go to the lab on the way out.   If you have mychart we'll likely use that to update you.    Take care.  Glad to see you. I would get a flu shot each fall.

## 2023-05-05 NOTE — Progress Notes (Unsigned)
Flu 2023 Shingles discussed with patient PNA previously done Tetanus 2023 COVID-vaccine previously done Colon cancer screening not due given his age. Prostate cancer screening deferred. Advance directive-daughter Harriett Sine designated if patient incapacitated.  He had crown and root canal recently.  D/w pt.    R hand paresthesia.  R carpal tunnel.  No help with injection. Feels like the hand goes to sleep.  Noted more at night.  He is R handed.  Still able to play ping pong and pickleball but has trouble writing/manipulating a button.  He has EMG pending. D/w pt about bracing at night.      Off iron and amiodarone.  S/p ablation.  Still on elquis.  No recent metoprolol use or need.  No heart racing.  No syncope.   Urostomy and colostomy at baseline.  No skin irritation at the base- situation is manageable.  No FCNAVD.  No blood seen in either bag. Eating well.     Meds, vitals, and allergies reviewed.   ROS: Per HPI unless specifically indicated in ROS section   GEN: nad, alert and oriented HEENT: mucous membranes moist NECK: supple w/o LA CV: IRR, no tachy.   PULM: ctab, no inc wob ABD: soft, +bs, double ostomy in place and normal appearing.  EXT: no edema SKIN: no acute rash  30 minutes were devoted to patient care in this encounter (this includes time spent reviewing the patient's file/history, interviewing and examining the patient, counseling/reviewing plan with patient).

## 2023-05-06 DIAGNOSIS — G5600 Carpal tunnel syndrome, unspecified upper limb: Secondary | ICD-10-CM | POA: Insufficient documentation

## 2023-05-06 DIAGNOSIS — Z0000 Encounter for general adult medical examination without abnormal findings: Secondary | ICD-10-CM | POA: Insufficient documentation

## 2023-05-06 LAB — COMPREHENSIVE METABOLIC PANEL
ALT: 10 U/L (ref 0–53)
AST: 13 U/L (ref 0–37)
Albumin: 3.9 g/dL (ref 3.5–5.2)
Alkaline Phosphatase: 60 U/L (ref 39–117)
BUN: 19 mg/dL (ref 6–23)
CO2: 28 meq/L (ref 19–32)
Calcium: 9 mg/dL (ref 8.4–10.5)
Chloride: 107 meq/L (ref 96–112)
Creatinine, Ser: 1.27 mg/dL (ref 0.40–1.50)
GFR: 50.26 mL/min — ABNORMAL LOW (ref >60.00)
Glucose, Bld: 100 mg/dL — ABNORMAL HIGH (ref 70–99)
Potassium: 4.2 meq/L (ref 3.5–5.1)
Sodium: 142 meq/L (ref 135–145)
Total Bilirubin: 0.3 mg/dL (ref 0.2–1.2)
Total Protein: 6.5 g/dL (ref 6.0–8.3)

## 2023-05-06 LAB — CBC WITH DIFFERENTIAL/PLATELET
Basophils Absolute: 0.1 10*3/uL (ref 0.0–0.1)
Basophils Relative: 0.9 % (ref 0.0–3.0)
Eosinophils Absolute: 0.4 10*3/uL (ref 0.0–0.7)
Eosinophils Relative: 5.4 % — ABNORMAL HIGH (ref 0.0–5.0)
HCT: 38.4 % — ABNORMAL LOW (ref 39.0–52.0)
Hemoglobin: 12.5 g/dL — ABNORMAL LOW (ref 13.0–17.0)
Lymphocytes Relative: 17.6 % (ref 12.0–46.0)
Lymphs Abs: 1.3 10*3/uL (ref 0.7–4.0)
MCHC: 32.6 g/dL (ref 30.0–36.0)
MCV: 96.5 fl (ref 78.0–100.0)
Monocytes Absolute: 0.7 10*3/uL (ref 0.1–1.0)
Monocytes Relative: 9.2 % (ref 3.0–12.0)
Neutro Abs: 5 10*3/uL (ref 1.4–7.7)
Neutrophils Relative %: 66.9 % (ref 43.0–77.0)
Platelets: 263 10*3/uL (ref 150.0–400.0)
RBC: 3.98 Mil/uL — ABNORMAL LOW (ref 4.22–5.81)
RDW: 13.7 % (ref 11.5–15.5)
WBC: 7.4 10*3/uL (ref 4.0–10.5)

## 2023-05-06 LAB — LIPID PANEL
Cholesterol: 189 mg/dL (ref 0–200)
HDL: 49 mg/dL (ref >39.00)
LDL Cholesterol: 119 mg/dL — ABNORMAL HIGH (ref 0–99)
NonHDL: 140.11
Total CHOL/HDL Ratio: 4
Triglycerides: 108 mg/dL (ref 0.0–149.0)
VLDL: 21.6 mg/dL (ref 0.0–40.0)

## 2023-05-06 LAB — IRON: Iron: 80 ug/dL (ref 42–165)

## 2023-05-06 NOTE — Assessment & Plan Note (Signed)
Flu 2023 Shingles discussed with patient PNA previously done Tetanus 2023 COVID-vaccine previously done Colon cancer screening not due given his age. Prostate cancer screening deferred. Advance directive-daughter Harriett Sine designated if patient incapacitated.

## 2023-05-06 NOTE — Assessment & Plan Note (Signed)
Continue with routine care

## 2023-05-06 NOTE — Assessment & Plan Note (Signed)
Off iron and amiodarone.  S/p ablation.  Still on elquis.  No recent metoprolol use or need.  No heart racing.  No syncope.  Would continue Eliquis as is.

## 2023-05-06 NOTE — Assessment & Plan Note (Signed)
Advance directive-daughter Izora Gala designated if patient incapacitated.

## 2023-05-06 NOTE — Assessment & Plan Note (Signed)
He has EMG pending. D/w pt about bracing at night.

## 2023-05-08 LAB — FERRITIN: Ferritin: 28.6 ng/mL (ref 22.0–322.0)

## 2023-05-08 LAB — TSH: TSH: 1.12 u[IU]/mL (ref 0.35–5.50)

## 2023-06-09 ENCOUNTER — Other Ambulatory Visit: Payer: Self-pay

## 2023-06-09 ENCOUNTER — Encounter (HOSPITAL_BASED_OUTPATIENT_CLINIC_OR_DEPARTMENT_OTHER)
Admission: RE | Admit: 2023-06-09 | Discharge: 2023-06-09 | Disposition: A | Discharge status: Home / Self Care | Payer: Medicare HMO | Source: Ambulatory Visit | Attending: Orthopedic Surgery | Admitting: Orthopedic Surgery

## 2023-06-09 ENCOUNTER — Encounter (HOSPITAL_BASED_OUTPATIENT_CLINIC_OR_DEPARTMENT_OTHER): Payer: Self-pay | Admitting: Orthopedic Surgery

## 2023-06-09 DIAGNOSIS — I4510 Unspecified right bundle-branch block: Secondary | ICD-10-CM | POA: Insufficient documentation

## 2023-06-09 DIAGNOSIS — I480 Paroxysmal atrial fibrillation: Secondary | ICD-10-CM | POA: Diagnosis not present

## 2023-06-09 DIAGNOSIS — Z01810 Encounter for preprocedural cardiovascular examination: Secondary | ICD-10-CM | POA: Diagnosis not present

## 2023-06-09 DIAGNOSIS — Z01818 Encounter for other preprocedural examination: Secondary | ICD-10-CM | POA: Diagnosis present

## 2023-06-09 DIAGNOSIS — R9431 Abnormal electrocardiogram [ECG] [EKG]: Secondary | ICD-10-CM | POA: Insufficient documentation

## 2023-06-09 NOTE — Progress Notes (Signed)
   06/09/23 1253  PAT Phone Screen  Is the patient taking a GLP-1 receptor agonist? No  Do You Have Diabetes? No  Do You Have Hypertension? No  Have You Ever Been to the ER for Asthma? No  Have You Taken Oral Steroids in the Past 3 Months? No  Do you Take Phenteramine or any Other Diet Drugs? No  Recent  Lab Work, EKG, CXR? No  Do you have a history of heart problems? (S)  Yes (chronic a-fib)  Cardiologist Name Dr Alinda Money for a-fib, ablation 2013, PCP Dr Para March now prescribes and treats patient  Have you ever had tests on your heart? Yes  What cardiac tests were performed? Echo;Stress Test  What date/year were cardiac tests completed? 2013, ECHO EF 55-65%  Results viewable: [facility] Media Tab  Any Recent Hospitalizations? No  Height 5\' 10"  (1.778 m)  Weight 70.3 kg  Pat Appointment Scheduled (S)  Yes (EKG)

## 2023-06-17 ENCOUNTER — Other Ambulatory Visit: Payer: Self-pay

## 2023-06-17 ENCOUNTER — Ambulatory Visit (HOSPITAL_BASED_OUTPATIENT_CLINIC_OR_DEPARTMENT_OTHER): Payer: Medicare HMO | Admitting: Anesthesiology

## 2023-06-17 ENCOUNTER — Encounter (HOSPITAL_BASED_OUTPATIENT_CLINIC_OR_DEPARTMENT_OTHER): Payer: Self-pay | Admitting: Orthopedic Surgery

## 2023-06-17 ENCOUNTER — Ambulatory Visit (HOSPITAL_BASED_OUTPATIENT_CLINIC_OR_DEPARTMENT_OTHER)
Admission: RE | Admit: 2023-06-17 | Discharge: 2023-06-17 | Disposition: A | Discharge status: Home / Self Care | Payer: Medicare HMO | Attending: Orthopedic Surgery | Admitting: Orthopedic Surgery

## 2023-06-17 ENCOUNTER — Encounter (HOSPITAL_BASED_OUTPATIENT_CLINIC_OR_DEPARTMENT_OTHER)
Admission: RE | Disposition: A | Discharge status: Home / Self Care | Payer: Self-pay | Source: Home / Self Care | Attending: Orthopedic Surgery

## 2023-06-17 DIAGNOSIS — G5601 Carpal tunnel syndrome, right upper limb: Secondary | ICD-10-CM | POA: Diagnosis present

## 2023-06-17 DIAGNOSIS — Z7901 Long term (current) use of anticoagulants: Secondary | ICD-10-CM | POA: Insufficient documentation

## 2023-06-17 DIAGNOSIS — Z87891 Personal history of nicotine dependence: Secondary | ICD-10-CM | POA: Diagnosis not present

## 2023-06-17 DIAGNOSIS — Z8551 Personal history of malignant neoplasm of bladder: Secondary | ICD-10-CM | POA: Diagnosis not present

## 2023-06-17 DIAGNOSIS — I10 Essential (primary) hypertension: Secondary | ICD-10-CM | POA: Insufficient documentation

## 2023-06-17 DIAGNOSIS — K279 Peptic ulcer, site unspecified, unspecified as acute or chronic, without hemorrhage or perforation: Secondary | ICD-10-CM | POA: Insufficient documentation

## 2023-06-17 DIAGNOSIS — I480 Paroxysmal atrial fibrillation: Secondary | ICD-10-CM | POA: Diagnosis not present

## 2023-06-17 DIAGNOSIS — Z8546 Personal history of malignant neoplasm of prostate: Secondary | ICD-10-CM | POA: Insufficient documentation

## 2023-06-17 HISTORY — DX: Myoneural disorder, unspecified: G70.9

## 2023-06-17 HISTORY — PX: CARPAL TUNNEL RELEASE: SHX101

## 2023-06-17 HISTORY — DX: Cardiac arrhythmia, unspecified: I49.9

## 2023-06-17 SURGERY — CARPAL TUNNEL RELEASE
Anesthesia: Monitor Anesthesia Care | Site: Hand | Laterality: Right

## 2023-06-17 MED ORDER — FENTANYL CITRATE (PF) 100 MCG/2ML IJ SOLN: 25.0000 ug | INTRAMUSCULAR | Status: DC | PRN

## 2023-06-17 MED ORDER — ONDANSETRON HCL 4 MG/2ML IJ SOLN
INTRAMUSCULAR | Status: AC
  Filled 2023-06-17: qty 2

## 2023-06-17 MED ORDER — LIDOCAINE 2% (20 MG/ML) 5 ML SYRINGE
INTRAMUSCULAR | Status: AC
  Filled 2023-06-17: qty 5

## 2023-06-17 MED ORDER — ACETAMINOPHEN 500 MG PO TABS
1000.0000 mg | ORAL_TABLET | Freq: Once | ORAL | Status: AC
  Administered 2023-06-17: 1000 mg via ORAL

## 2023-06-17 MED ORDER — PROPOFOL 500 MG/50ML IV EMUL
INTRAVENOUS | Status: AC
  Filled 2023-06-17: qty 50

## 2023-06-17 MED ORDER — LACTATED RINGERS IV SOLN: INTRAVENOUS | Status: DC

## 2023-06-17 MED ORDER — BUPIVACAINE HCL (PF) 0.25 % IJ SOLN
INTRAMUSCULAR | Status: AC
  Filled 2023-06-17: qty 30

## 2023-06-17 MED ORDER — PROPOFOL 10 MG/ML IV BOLUS
INTRAVENOUS | Status: DC | PRN
  Administered 2023-06-17: 20 mg via INTRAVENOUS

## 2023-06-17 MED ORDER — ONDANSETRON HCL 4 MG/2ML IJ SOLN
INTRAMUSCULAR | Status: DC | PRN
  Administered 2023-06-17: 4 mg via INTRAVENOUS

## 2023-06-17 MED ORDER — CEFAZOLIN SODIUM-DEXTROSE 2-4 GM/100ML-% IV SOLN
INTRAVENOUS | Status: AC
  Filled 2023-06-17: qty 100

## 2023-06-17 MED ORDER — ACETAMINOPHEN 500 MG PO TABS
ORAL_TABLET | ORAL | Status: AC
  Filled 2023-06-17: qty 2

## 2023-06-17 MED ORDER — CEFAZOLIN SODIUM-DEXTROSE 2-4 GM/100ML-% IV SOLN
2.0000 g | INTRAVENOUS | Status: AC
  Administered 2023-06-17: 2 g via INTRAVENOUS

## 2023-06-17 MED ORDER — PROPOFOL 500 MG/50ML IV EMUL
INTRAVENOUS | Status: DC | PRN
  Administered 2023-06-17: 75 ug/kg/min via INTRAVENOUS

## 2023-06-17 MED ORDER — BUPIVACAINE HCL (PF) 0.25 % IJ SOLN
INTRAMUSCULAR | Status: DC | PRN
  Administered 2023-06-17: 10 mL

## 2023-06-17 SURGICAL SUPPLY — 30 items
APL PRP STRL LF DISP 70% ISPRP (MISCELLANEOUS) ×1
BLADE SURG 15 STRL LF DISP TIS (BLADE) ×2 IMPLANT
BLADE SURG 15 STRL SS (BLADE) ×1
BNDG CMPR 5X3 KNIT ELC UNQ LF (GAUZE/BANDAGES/DRESSINGS) ×1
BNDG CMPR 9X4 STRL LF SNTH (GAUZE/BANDAGES/DRESSINGS) ×1
BNDG ELASTIC 3INX 5YD STR LF (GAUZE/BANDAGES/DRESSINGS) ×2 IMPLANT
BNDG ESMARK 4X9 LF (GAUZE/BANDAGES/DRESSINGS) ×2 IMPLANT
BNDG GAUZE DERMACEA FLUFF 4 (GAUZE/BANDAGES/DRESSINGS) ×2 IMPLANT
BNDG GZE DERMACEA 4 6PLY (GAUZE/BANDAGES/DRESSINGS) ×1
CHLORAPREP W/TINT 26 (MISCELLANEOUS) ×2 IMPLANT
CORD BIPOLAR FORCEPS 12FT (ELECTRODE) ×2 IMPLANT
COVER BACK TABLE 60X90IN (DRAPES) ×2 IMPLANT
CUFF TOURN SGL QUICK 18X4 (TOURNIQUET CUFF) ×2 IMPLANT
DRAPE EXTREMITY T 121X128X90 (DISPOSABLE) ×2 IMPLANT
DRAPE SURG 17X23 STRL (DRAPES) ×2 IMPLANT
GAUZE XEROFORM 1X8 LF (GAUZE/BANDAGES/DRESSINGS) ×2 IMPLANT
GLOVE BIO SURGEON STRL SZ7 (GLOVE) ×2 IMPLANT
GLOVE BIOGEL PI IND STRL 7.0 (GLOVE) ×2 IMPLANT
GOWN STRL REUS W/ TWL LRG LVL3 (GOWN DISPOSABLE) ×4 IMPLANT
GOWN STRL REUS W/TWL LRG LVL3 (GOWN DISPOSABLE) ×2
NDL HYPO 25X1 1.5 SAFETY (NEEDLE) ×2 IMPLANT
NEEDLE HYPO 25X1 1.5 SAFETY (NEEDLE) ×1
NS IRRIG 1000ML POUR BTL (IV SOLUTION) ×2 IMPLANT
PACK BASIN DAY SURGERY FS (CUSTOM PROCEDURE TRAY) ×2 IMPLANT
SHEET MEDIUM DRAPE 40X70 STRL (DRAPES) ×2 IMPLANT
SUT ETHILON 4 0 PS 2 18 (SUTURE) ×2 IMPLANT
SYR BULB EAR ULCER 3OZ GRN STR (SYRINGE) ×2 IMPLANT
SYR CONTROL 10ML LL (SYRINGE) ×2 IMPLANT
TOWEL GREEN STERILE FF (TOWEL DISPOSABLE) ×4 IMPLANT
UNDERPAD 30X36 HEAVY ABSORB (UNDERPADS AND DIAPERS) ×2 IMPLANT

## 2023-06-17 NOTE — Op Note (Signed)
   Date of Surgery: 06/17/2023  INDICATIONS: Patient is a 89 y.o.-year-old male with right carpal tunnel syndrome that has failed conservative management.  Risks, benefits, and alternatives to surgery were again discussed with the patient in the preoperative area. The patient wishes to proceed with surgery.  Informed consent was signed after our discussion.   PREOPERATIVE DIAGNOSIS:  Right carpal tunnel syndrome  POSTOPERATIVE DIAGNOSIS: Same.  PROCEDURE:  Right carpal tunnel release   SURGEON: Waylan Rocher, M.D.  ASSIST:   ANESTHESIA:  Local, MAC  IV FLUIDS AND URINE: See anesthesia.  ESTIMATED BLOOD LOSS: <5 mL.  IMPLANTS: * No implants in log *   DRAINS: None  COMPLICATIONS: None  DESCRIPTION OF PROCEDURE: The patient was met in the preoperative holding area where the surgical site was marked and the informed consent form was signed.  The patient was then brought back to the operating room and remained on the stretcher.  A hand table was placed adjacent to the operative extremity and locked into place.  A tourniquet was placed on the right forearm.  A formal timeout was performed to confirm that this was the correct patient, surgical side, surgical site, and surgical procedure.  All were present and in agreement. Following formal timeout, a local block was performed using 10 mL of 0.25% plain marcaine.  The right upper extremity was then prepped and draped in the usual and sterile fashion.   Following a second timeout, the limb was exsanguinated and the tourniquet inflated to 250 mmHg.  A longitudinal incision was made in line with the radial border of the ring finger from distal to the wrist flexion crease to the intersection of Kaplan's cardinal line.  The skin and subcutaneous tissue was sharply divided.  The longitudinally running palmar fascia was incised.  The thenar musculature was bluntly swept off of the transverse carpal ligament.  The ligament was divided from  proximal to distal until the fat surrounding the palmar arch was encountered. Retractors were then placed in the proximal aspect of the wound to visualize the distal antebrachial fascia.  The fascia was sharply divided under direct visualization.   The wound was then thoroughly irrigated with sterile saline.  The tourniquet was deflated.  Hemostasis was achieved with direct pressure and bipolar electrocautery.  The wound was then closed with 4-0 nylon sutures in a horizontal mattress fashion. The wound was then dressed with xeroform, folded kerlix, and an ace wrap.  The patient was reversed from sedation and the drapes taken down.   All counts were correct x 2 at the end of the procedure.  The patient was then taken to the PACU in stable condition.    POSTOPERATIVE PLAN: He will be discharged to home with appropriate pain medication and discharge instructions.  I'll see him back in the office in 10-14 days for his first postop visit.   Waylan Rocher, MD 1:54 PM

## 2023-06-17 NOTE — Interval H&P Note (Signed)
History and Physical Interval Note:  06/17/2023 1:01 PM  Timothy Bullock  has presented today for surgery, with the diagnosis of Carpal tunnel syndrome of right wrist.  The various methods of treatment have been discussed with the patient and family. After consideration of risks, benefits and other options for treatment, the patient has consented to  Procedure(s) with comments: CARPAL TUNNEL RELEASE (Right) - mac and local 30 min as a surgical intervention.  The patient's history has been reviewed, patient examined, no change in status, stable for surgery.  I have reviewed the patient's chart and labs.  Questions were answered to the patient's satisfaction.     Dyane Broberg Cardale Dorer

## 2023-06-17 NOTE — Transfer of Care (Signed)
Immediate Anesthesia Transfer of Care Note  Patient: Timothy Bullock  Procedure(s) Performed: CARPAL TUNNEL RELEASE (Right: Hand)  Patient Location: PACU  Anesthesia Type:MAC  Level of Consciousness: awake, alert , and oriented  Airway & Oxygen Therapy: Patient Spontanous Breathing and Patient connected to face mask oxygen  Post-op Assessment: Report given to RN and Post -op Vital signs reviewed and stable  Post vital signs: Reviewed and stable  Last Vitals:  Vitals Value Taken Time  BP 138/110 06/17/23 1348  Temp    Pulse 97 06/17/23 1349  Resp 20 06/17/23 1349  SpO2 96 % 06/17/23 1349  Vitals shown include unfiled device data.  Last Pain:  Vitals:   06/17/23 1123  TempSrc: Oral  PainSc: 0-No pain         Complications: No notable events documented.

## 2023-06-17 NOTE — Anesthesia Preprocedure Evaluation (Addendum)
Anesthesia Evaluation  Patient identified by MRN, date of birth, ID band Patient awake    Reviewed: Allergy & Precautions, NPO status , Patient's Chart, lab work & pertinent test results  Airway Mallampati: II  TM Distance: >3 FB Neck ROM: Full    Dental no notable dental hx. (+) Dental Advisory Given, Chipped,    Pulmonary former smoker   Pulmonary exam normal breath sounds clear to auscultation       Cardiovascular hypertension, Normal cardiovascular exam+ dysrhythmias (eliquis) Atrial Fibrillation  Rhythm:Regular Rate:Normal     Neuro/Psych negative neurological ROS  negative psych ROS   GI/Hepatic Neg liver ROS, PUD,,,  Endo/Other  negative endocrine ROS    Renal/GU negative Renal ROS  negative genitourinary   Musculoskeletal negative musculoskeletal ROS (+)    Abdominal   Peds  Hematology negative hematology ROS (+)   Anesthesia Other Findings   Reproductive/Obstetrics                             Anesthesia Physical Anesthesia Plan  ASA: 3  Anesthesia Plan: MAC   Post-op Pain Management: Tylenol PO (pre-op)*   Induction: Intravenous  PONV Risk Score and Plan: 1 and Propofol infusion, Treatment may vary due to age or medical condition, Ondansetron and Dexamethasone  Airway Management Planned: Natural Airway  Additional Equipment:   Intra-op Plan:   Post-operative Plan:   Informed Consent: I have reviewed the patients History and Physical, chart, labs and discussed the procedure including the risks, benefits and alternatives for the proposed anesthesia with the patient or authorized representative who has indicated his/her understanding and acceptance.     Dental advisory given  Plan Discussed with: CRNA  Anesthesia Plan Comments:        Anesthesia Quick Evaluation

## 2023-06-17 NOTE — Anesthesia Postprocedure Evaluation (Signed)
Anesthesia Post Note  Patient: REVEL STELLMACH  Procedure(s) Performed: CARPAL TUNNEL RELEASE (Right: Hand)     Patient location during evaluation: PACU Anesthesia Type: MAC Level of consciousness: awake and alert Pain management: pain level controlled Vital Signs Assessment: post-procedure vital signs reviewed and stable Respiratory status: spontaneous breathing, nonlabored ventilation, respiratory function stable and patient connected to nasal cannula oxygen Cardiovascular status: stable and blood pressure returned to baseline Postop Assessment: no apparent nausea or vomiting Anesthetic complications: no  No notable events documented.  Last Vitals:  Vitals:   06/17/23 1415 06/17/23 1424  BP: (!) 158/85 (!) 150/95  Pulse: 83 77  Resp: (!) 28 16  Temp:  (!) 36.2 C  SpO2: 96% 97%    Last Pain:  Vitals:   06/17/23 1424  TempSrc:   PainSc: 0-No pain                 Shelton Silvas

## 2023-06-17 NOTE — Discharge Instructions (Addendum)
Post Anesthesia Home Care Instructions  Activity: Get plenty of rest for the remainder of the day. A responsible individual must stay with you for 24 hours following the procedure.  For the next 24 hours, DO NOT: -Drive a car -Advertising copywriter -Drink alcoholic beverages -Take any medication unless instructed by your physician -Make any legal decisions or sign important papers.  Meals: Start with liquid foods such as gelatin or soup. Progress to regular foods as tolerated. Avoid greasy, spicy, heavy foods. If nausea and/or vomiting occur, drink only clear liquids until the nausea and/or vomiting subsides. Call your physician if vomiting continues.  Special Instructions/Symptoms: Your throat may feel dry or sore from the anesthesia or the breathing tube placed in your throat during surgery. If this causes discomfort, gargle with warm salt water. The discomfort should disappear within 24 hours.  If you had a scopolamine patch placed behind your ear for the management of post- operative nausea and/or vomiting:  1. The medication in the patch is effective for 72 hours, after which it should be removed.  Wrap patch in a tissue and discard in the trash. Wash hands thoroughly with soap and water. 2. You may remove the patch earlier than 72 hours if you experience unpleasant side effects which may include dry mouth, dizziness or visual disturbances. 3. Avoid touching the patch. Wash your hands with soap and water after contact with the patch.     Next dose of Tylenol may be given at 5:30pm if needed.    Waylan Rocher, M.D. Hand Surgery  POST-OPERATIVE DISCHARGE INSTRUCTIONS   PRESCRIPTIONS: - You may have been given a prescription to be taken as directed for post-operative pain control.  You may also take over the counter ibuprofen/aleve and tylenol for pain. Take this as directed on the packaging. Do not exceed 3000 mg tylenol/acetaminophen in 24 hours.  Ibuprofen 600-800 mg (3-4)  tablets by mouth every 6 hours as needed for pain.   OR  Aleve 2 tablets by mouth every 12 hours (twice daily) as needed for pain.   AND/OR  Tylenol 1000 mg (2 tablets) every 8 hours as needed for pain.  - Please use your pain medication carefully, as refills are limited and you may not be provided with one.  As stated above, please use over the counter pain medicine - it will also be helpful with decreasing your swelling.    ANESTHESIA: -After your surgery, post-surgical discomfort or pain is likely. This discomfort can last several days to a few weeks. At certain times of the day your discomfort may be more intense.   Did you receive a nerve block?   - A nerve block can provide pain relief for one hour to two days after your surgery. As long as the nerve block is working, you will experience little or no sensation in the area the surgeon operated on.  - As the nerve block wears off, you will begin to experience pain or discomfort. It is very important that you begin taking your prescribed pain medication before the nerve block fully wears off. Treating your pain at the first sign of the block wearing off will ensure your pain is better controlled and more tolerable when full-sensation returns. Do not wait until the pain is intolerable, as the medicine will be less effective. It is better to treat pain in advance than to try and catch up.   General Anesthesia:  If you did not receive a nerve block during your surgery, you will  need to start taking your pain medication shortly after your surgery and should continue to do so as prescribed by your surgeon.     ICE AND ELEVATION: - You may use ice for the first 48-72 hours, but it is not critical.   - Motion of your fingers is very important to decrease the swelling.  - Elevation, as much as possible for the next 48 hours, is critical for decreasing swelling as well as for pain relief. Elevation means when you are seated or lying down, you  hand should be at or above your heart. When walking, the hand needs to be at or above the level of your elbow.  - If the bandage gets too tight, it may need to be loosened. Please contact our office and we will instruct you in how to do this.    SURGICAL BANDAGES:  - Keep your dressing and/or splint clean and dry at all times.  You can remove your dressing 7 days from now and change with a dry dressing or Band-Aids as needed thereafter. - You may place a plastic bag over your bandage to shower, but be careful, do not get your bandages wet.  - After the bandages have been removed, it is OK to get the stitches wet in a shower or with hand washing. Do Not soak or submerge the wound yet. Please do not use lotions or creams on the stitches.      HAND THERAPY:  - You may not need any. If you do, we will begin this at your follow up visit in the clinic.    ACTIVITY AND WORK: - You are encouraged to move any fingers which are not in the bandage.  - Light use of the fingers is allowed to assist the other hand with daily hygiene and eating, but strong gripping or lifting is often uncomfortable and should be avoided.  - You might miss a variable period of time from work and hopefully this issue has been discussed prior to surgery. You may not do any heavy work with your affected hand for about 2 weeks.    [facility] Second Floor, [address] [place], [place] [address] [phone]

## 2023-06-17 NOTE — H&P (Signed)
HAND SURGERY   HPI: Patient is a 89 y.o. male who presents with right carpal tunnel syndrome that has failed extensive non surigcal management.  Patient denies any changes to their medical history or new systemic symptoms today.    Past Medical History:  Diagnosis Date   ARF (acute renal failure) (HCC)    Bladder cancer (HCC)    Blood in stool    Blood transfusion    Autologous donation with prostate surgery   Diverticulitis    Dysrhythmia    a-fib with right BBB   H/O cardiac radiofrequency ablation    Neuromuscular disorder [facility])    right carpal tunnel   Paroxysmal atrial fibrillation (HCC) 05/28/2012   s/p ablation   Prostate cancer [facility])    s/p resection   Ulcer    gastric   Urinary incontinence    Past Surgical History:  Procedure Laterality Date   BLADDER REMOVAL     CARDIAC CATHETERIZATION  2013   [facility]   COLONOSCOPY  02/2012   negative pathology    COLOSTOMY     with bladder cancer surgery 2017   OTHER SURGICAL HISTORY     urostomy   PERCUTANEOUS NEPHROSTOMY  2017   PROSTATECTOMY  1993   Social History   Socioeconomic History   Marital status: Widowed    Spouse name: Not on file   Number of children: Not on file   Years of education: Not on file   Highest education level: Not on file  Occupational History   Occupation: Retired  Tobacco Use   Smoking status: Former    Current packs/day: 0.00    Types: Cigarettes    Quit date: 09/15/1965    Years since quitting: 57.7   Smokeless tobacco: Never  Substance and Sexual Activity   Alcohol use: Yes    Comment: Rare beer   Drug use: No   Sexual activity: Not Currently  Other Topics Concern   Not on file  Social History Narrative   Widowed 2021------ was married in 1982.   High school grad   Regular exercise:  Yes, walking   From [place]   1955-[address].  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)   Social Determinants of Health   Financial Resource Strain: Low Risk  (11/05/2022)    Overall Financial Resource Strain (CARDIA)    Difficulty of Paying Living Expenses: Not hard at all  Food Insecurity: No Food Insecurity (11/05/2022)   Hunger Vital Sign    Worried About Running Out of Food in the Last Year: Never true    Ran Out of Food in the Last Year: Never true  Transportation Needs: No Transportation Needs (11/05/2022)   PRAPARE - Administrator, Civil Service (Medical): No    Lack of Transportation (Non-Medical): No  Physical Activity: Sufficiently Active (11/05/2022)   Exercise Vital Sign    Days of Exercise per Week: 6 days    Minutes of Exercise per Session: 90 min  Stress: No Stress Concern Present (11/05/2022)   [employer] of Occupational Health - Occupational Stress Questionnaire    Feeling of Stress : Not at all  Social Connections: Moderately Isolated (11/05/2022)   Social Connection and Isolation Panel [NHANES]    Frequency of Communication with Friends and Family: More than three times a week    Frequency of Social Gatherings with Friends and Family: More than three times a week    Attends Religious Services: More than 4 times per year  Active Member of Clubs or Organizations: No    Attends Banker Meetings: Never    Marital Status: Widowed   Family History  Problem Relation Age of Onset   Cancer Mother        H/O colon CA   Alcohol abuse Father    Cancer Father        CA of tongue   Cancer Other        colon, parents <60   - negative except otherwise stated in the family history section No Known Allergies Prior to Admission medications   Medication Sig Start Date End Date Taking? Authorizing Provider  apixaban (ELIQUIS) 5 MG TABS tablet Take 1 tablet (5 mg total) by mouth 2 (two) times daily. 05/05/23  Yes Joaquim Nam, MD  ibuprofen (ADVIL) 200 MG tablet Take 400 mg by mouth every 6 (six) hours as needed.   Yes [provider]  senna ([facility] SENNA) 8.6 MG tablet Take 2 tablets (17.2 mg total) by mouth  daily. 05/05/23  Yes Joaquim Nam, MD   No results found. - Positive ROS: All other systems have been reviewed and were otherwise negative with the exception of those mentioned in the HPI and as above.  Physical Exam: General: No acute distress, resting comfortably Cardiovascular: BUE warm and well perfused, normal rate Respiratory: Normal WOB on RA Skin: Warm and dry Neurologic: Sensation intact distally Psychiatric: Patient is at baseline mood and affect  Right Upper Extremity  Positive Tinel, Phalen, Durkan signs at wrist.  Mild thenar atrophy.  Sensation grossly intact in the median, ulnar, and radial nerve distributions.  Hand warm and well perfused w/ BCR.   Assessment: 88 yo M w/ R CTS that has failed conservative management.  Plan: OR today for R CTR. We again reviewed the risks of surgery which include bleeding, infection, damage to neurovascular structures, persistent symptoms, pillar pain, persistent numbness, damage to the median nerve or it's branches, need for additional surgery.  Informed consent was signed.  All questions were answered.   Marlyne Beards, M.D. [facility] 12:58 PM

## 2023-06-17 NOTE — Anesthesia Procedure Notes (Signed)
Procedure Name: MAC Date/Time: 06/17/2023 1:16 PM  Performed by: Cleda Clarks, CRNAPre-anesthesia Checklist: Patient identified, Emergency Drugs available, Suction available, Patient being monitored and Timeout performed Patient Re-evaluated:Patient Re-evaluated prior to induction Oxygen Delivery Method: Simple face mask Placement Confirmation: positive ETCO2

## 2023-06-18 ENCOUNTER — Encounter (HOSPITAL_BASED_OUTPATIENT_CLINIC_OR_DEPARTMENT_OTHER): Payer: Self-pay | Admitting: Orthopedic Surgery

## 2023-08-19 ENCOUNTER — Other Ambulatory Visit: Payer: Self-pay | Admitting: Family Medicine

## 2023-08-21 NOTE — Telephone Encounter (Signed)
Please check with patient about use/status.  I thought he was off med, based on EMR.  Thanks.

## 2023-08-21 NOTE — Telephone Encounter (Signed)
Last office visit: 05/05/23  Next office visit: nothing scheduled Last refill: GABAPENTIN 100 MG CAPSULE The original prescription was discontinued on 11/26/2022 by Lissa Hoard, PA-C for the following reason: Completed Course

## 2023-08-23 NOTE — Telephone Encounter (Signed)
Please check with patient about this.  I thought he was off the medication.  If he happened to still be taking it, please let me know.  Thanks.

## 2023-08-24 NOTE — Telephone Encounter (Signed)
Called and spoke with patient he has been taking the gabapentin 100mg  every day. He did not stop taking this.

## 2023-08-25 NOTE — Telephone Encounter (Signed)
Sent. Thanks.   

## 2023-11-11 ENCOUNTER — Ambulatory Visit (INDEPENDENT_AMBULATORY_CARE_PROVIDER_SITE_OTHER): Payer: Medicare HMO

## 2023-11-11 VITALS — Ht 70.0 in | Wt 155.0 lb

## 2023-11-11 DIAGNOSIS — Z0000 Encounter for general adult medical examination without abnormal findings: Secondary | ICD-10-CM | POA: Diagnosis not present

## 2023-11-11 NOTE — Progress Notes (Signed)
 Subjective:   Timothy Bullock is a 89 y.o. who presents for a Medicare Wellness preventive visit.  Visit Complete: Virtual I connected with  [name] on 11/11/23 by a audio enabled telemedicine application and verified that I am speaking with the correct person using two identifiers.  Patient Location: Home  Provider Location: Home Office  I discussed the limitations of evaluation and management by telemedicine. The patient expressed understanding and agreed to proceed.  Vital Signs: Because this visit was a virtual/telehealth visit, some criteria may be missing or patient reported. Any vitals not documented were not able to be obtained and vitals that have been documented are patient reported.  VideoDeclined- This patient declined Librarian, academic. Therefore the visit was completed with audio only.  AWV Questionnaire: No: Patient Medicare AWV questionnaire was not completed prior to this visit.  Cardiac Risk Factors include: advanced age (>58men, >19 women);hypertension;male gender     Objective:    Today's Vitals   11/11/23 0935  Weight: 155 lb (70.3 kg)  Height: 5\' 10"  (1.778 m)   Body mass index is 22.24 kg/m.     11/11/2023    9:43 AM 06/17/2023   11:18 AM 06/09/2023   12:59 PM 02/13/2023    1:38 PM 11/26/2022    2:33 PM 11/05/2022    9:36 AM 08/12/2016    2:52 PM  Advanced Directives  Does Patient Have a Medical Advance Directive? Yes Yes Yes Yes Yes Yes Yes  Type of Estate agent of [place];Living will Healthcare Power of [place];Living will Healthcare Power of [place];Living will Living will Healthcare Power of [employer] Power of [employer] Power of [place];Living will  Does patient want to make changes to medical advance directive?  No - Patient declined       Copy of Healthcare Power of Attorney in Chart? No - copy requested No - copy requested    No - copy requested No - copy  requested    Current Medications (verified) Outpatient Encounter Medications as of 11/11/2023  Medication Sig   apixaban (ELIQUIS) 5 MG TABS tablet Take 1 tablet (5 mg total) by mouth 2 (two) times daily.   ibuprofen (ADVIL) 200 MG tablet Take 400 mg by mouth every 6 (six) hours as needed.   senna ([employer] SENNA) 8.6 MG tablet Take 2 tablets (17.2 mg total) by mouth daily.   gabapentin (NEURONTIN) 100 MG capsule TAKE 1-2 CAPSULES (100-200 MG TOTAL) BY MOUTH AT BEDTIME. (Patient not taking: Reported on 11/11/2023)   No facility-administered encounter medications on file as of 11/11/2023.    Allergies (verified) Patient has no known allergies.   History: Past Medical History:  Diagnosis Date   ARF (acute renal failure) (HCC)    Bladder cancer (HCC)    Blood in stool    Blood transfusion    Autologous donation with prostate surgery   Diverticulitis    Dysrhythmia    a-fib with right BBB   H/O cardiac radiofrequency ablation    Neuromuscular disorder [facility])    right carpal tunnel   Paroxysmal atrial fibrillation (HCC) 05/28/2012   s/p ablation   Prostate cancer [facility])    s/p resection   Ulcer    gastric   Urinary incontinence    Past Surgical History:  Procedure Laterality Date   BLADDER REMOVAL     CARDIAC CATHETERIZATION  2013   [facility]   CARPAL TUNNEL RELEASE Right 06/17/2023   Procedure: CARPAL TUNNEL RELEASE;  Surgeon:  Marlyne Beards, MD;  Location: [facility];  Service: Orthopedics;  Laterality: Right;  mac and local 30 min   COLONOSCOPY  02/2012   negative pathology    COLOSTOMY     with bladder cancer surgery 2017   OTHER SURGICAL HISTORY     urostomy   PERCUTANEOUS NEPHROSTOMY  2017   PROSTATECTOMY  1993   Family History  Problem Relation Age of Onset   Cancer Mother        H/O colon CA   Alcohol abuse Father    Cancer Father        CA of tongue   Cancer Other        colon, parents <60   Social History   Socioeconomic History   Marital  status: Widowed    Spouse name: Not on file   Number of children: Not on file   Years of education: Not on file   Highest education level: Not on file  Occupational History   Occupation: Retired  Tobacco Use   Smoking status: Former    Current packs/day: 0.00    Types: Cigarettes    Quit date: 09/15/1965    Years since quitting: 58.1   Smokeless tobacco: Never  Substance and Sexual Activity   Alcohol use: Yes    Comment: Rare beer   Drug use: No   Sexual activity: Not Currently  Other Topics Concern   Not on file  Social History Narrative   Widowed 2021------ was married in 1982.   High school grad   Regular exercise:  Yes, walking   From [place]   1955-[place].  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)   Social Drivers of [employer]: Low Risk  (11/11/2023)   Overall Financial Resource Strain (CARDIA)    Difficulty of Paying Living Expenses: Not hard at all  Food Insecurity: No Food Insecurity (11/11/2023)   Hunger Vital Sign    Worried About Running Out of Food in the Last Year: Never true    Ran Out of Food in the Last Year: Never true  Transportation Needs: No Transportation Needs (11/11/2023)   PRAPARE - Administrator, Civil Service (Medical): No    Lack of Transportation (Non-Medical): No  Physical Activity: Sufficiently Active (11/11/2023)   Exercise Vital Sign    Days of Exercise per Week: 6 days    Minutes of Exercise per Session: 60 min  Stress: No Stress Concern Present (11/11/2023)   [employer] of Occupational Health - Occupational Stress Questionnaire    Feeling of Stress : Not at all  Social Connections: Moderately Integrated (11/11/2023)   Social Connection and Isolation Panel [NHANES]    Frequency of Communication with Friends and Family: More than three times a week    Frequency of Social Gatherings with Friends and Family: More than three times a week    Attends Religious Services: More than 4 times  per year    Active Member of [employer] or Organizations: Yes    Attends Banker Meetings: More than 4 times per year    Marital Status: Widowed    Tobacco Counseling Counseling given: Not Answered  Clinical Intake:  Pre-visit preparation completed: No  Pain : No/denies pain    BMI - recorded: 22.24 Nutritional Status: BMI of 19-24  Normal Nutritional Risks: None Diabetes: No  How often do you need to have someone help you when you read instructions, pamphlets, or other  written materials from your doctor or pharmacy?: 1 - Never  Interpreter Needed?: No  Comments: [facility] Information entered by :: B.Beila Purdie,LPN   Activities of Daily Living     11/11/2023    9:44 AM 06/17/2023   11:22 AM  In your present state of health, do you have any difficulty performing the following activities:  Hearing? 0 0  Vision? 0 0  Difficulty concentrating or making decisions? 0 0  Walking or climbing stairs? 0   Dressing or bathing? 0   Doing errands, shopping? 0   Preparing Food and eating ? N   Using the Toilet? N   In the past six months, have you accidently leaked urine? Y   Do you have problems with loss of bowel control? Y   Managing your Medications? N   Managing your Finances? N   Housekeeping or managing your Housekeeping? N     Patient Care Team: Joaquim Nam, MD as PCP - General Lanier Prude, MD as PCP - Electrophysiology (Cardiology) Iran Ouch, MD as Attending Physician (Cardiology) [facility] (Cardiology) Jiles Garter, M.D. (Cardiology) Kennedy Bucker, MD as Consulting Physician (Orthopedic Surgery)  Indicate any recent Medical Services you may have received from other than [facility] providers in the past year (date may be approximate).     Assessment:   This is a routine wellness examination for Morgan.  Hearing/Vision screen Hearing Screening - Comments:: Pt says his hearing is good w/aids Vision Screening -  Comments:: Pt says his vision is good w/bifocals   Goals Addressed             This Visit's Progress    Increase physical activity   On track    Starting 08/12/2016, I will continue to walk at least 60 min 3 days per week.  11/10/24-will continue       Depression Screen     11/11/2023    9:41 AM 05/05/2023    2:58 PM 02/24/2023    9:00 AM 11/05/2022    9:25 AM 10/14/2021   11:01 AM 10/27/2017   10:23 AM 08/12/2016    2:52 PM  PHQ 2/9 Scores  PHQ - 2 Score 0 0 0 0 0 0 0  PHQ- 9 Score   0        Fall Risk     11/11/2023    9:37 AM 05/05/2023    2:58 PM 02/24/2023    8:59 AM 11/05/2022    9:22 AM 08/12/2022    8:17 AM  Fall Risk   Falls in the past year? 1 0 1 0 0  Number falls in past yr: 0 0 0 0 0  Injury with Fall? 1 0 1 0 0  Comment fell broke rt shoulder      Risk for fall due to : No Fall Risks No Fall Risks History of fall(s) No Fall Risks History of fall(s)  Follow up Education provided;Falls prevention discussed Falls evaluation completed Falls evaluation completed Education provided;Falls prevention discussed Falls evaluation completed    MEDICARE RISK AT HOME:  Medicare Risk at Home Any stairs in or around the home?: No If so, are there any without handrails?: No Home free of loose throw rugs in walkways, pet beds, electrical cords, etc?: Yes Adequate lighting in your home to reduce risk of falls?: Yes Life alert?: Yes Use of a cane, walker or w/c?: Yes (rollater use for long walkers) Grab bars in the bathroom?: Yes Shower chair or  bench in shower?: Yes Elevated toilet seat or a handicapped toilet?: Yes  TIMED UP AND GO:  Was the test performed?  No  Cognitive Function: 6CIT completed    08/12/2016    3:04 PM  MMSE - Mini Mental State Exam  Orientation to time 5  Orientation to Place 5  Registration 3  Attention/ Calculation 0  Recall 1  Recall-comments pt was unable to recall 2 of 3 words  Language- name 2 objects 0  Language- repeat 1   Language- follow 3 step command 3  Language- read & follow direction 0  Write a sentence 0  Copy design 0  Total score 18        11/11/2023    9:48 AM 05/05/2023    3:09 PM 11/05/2022    9:29 AM  6CIT Screen  What Year? 0 points 0 points 0 points  What month? 0 points 0 points 0 points  What time? 0 points 0 points 0 points  Count back from 20 0 points 0 points 0 points  Months in reverse 0 points 0 points 0 points  Repeat phrase 0 points  0 points  Total Score 0 points  0 points    Immunizations Immunization History  Administered Date(s) Administered   Fluad Quad(high Dose 65+) 06/27/2019, 08/12/2022   Influenza Split 07/01/2011   Influenza Whole 07/23/2010   Influenza, High Dose Seasonal PF 09/11/2021   Influenza,inj,Quad PF,6+ Mos 06/23/2013, 08/17/2014, 07/03/2015, 06/05/2016, 06/02/2017, 09/09/2018   Influenza-Unspecified 06/27/2019   Moderna Covid-19 Vaccine Bivalent Booster 69yrs & up 02/11/2022   Moderna Sars-Covid-2 Vaccination 09/29/2019, 10/27/2019, 07/31/2020, 01/31/2021   Pfizer Covid-19 Vaccine Bivalent Booster 77yrs & up 06/06/2021   Pneumococcal Polysaccharide-23 07/01/2011   Tdap 09/29/2021   Zoster, Live 02/12/2011    Screening Tests Health Maintenance  Topic Date Due   Zoster Vaccines- Shingrix (1 of 2) 04/22/1951   Pneumonia Vaccine 69+ Years old (2 of 2 - PCV) 06/30/2012   COVID-19 Vaccine (7 - 2024-25 season) 05/17/2023   INFLUENZA VACCINE  12/14/2023 (Originally 04/16/2023)   Medicare Annual Wellness (AWV)  11/10/2024   DTaP/Tdap/Td (2 - Td or Tdap) 09/30/2031   HPV VACCINES  Aged Out    Health Maintenance  Health Maintenance Due  Topic Date Due   Zoster Vaccines- Shingrix (1 of 2) 04/22/1951   Pneumonia Vaccine 60+ Years old (2 of 2 - PCV) 06/30/2012   COVID-19 Vaccine (7 - 2024-25 season) 05/17/2023   Health Maintenance Items Addressed: none needed   Additional Screening:  Vision Screening: Recommended annual ophthalmology exams  for early detection of glaucoma and other disorders of the eye.  Dental Screening: Recommended annual dental exams for proper oral hygiene  Community Resource Referral / Chronic Care Management: CRR required this visit?  No   CCM required this visit?  No    Plan:     I have personally reviewed and noted the following in the patient's chart:   Medical and social history Use of alcohol, tobacco or illicit drugs  Current medications and supplements including opioid prescriptions. Patient is not currently taking opioid prescriptions. Functional ability and status Nutritional status Physical activity Advanced directives List of other physicians Hospitalizations, surgeries, and ER visits in previous 12 months Vitals Screenings to include cognitive, depression, and falls Referrals and appointments  In addition, I have reviewed and discussed with patient certain preventive protocols, quality metrics, and best practice recommendations. A written personalized care plan for preventive services as well as general preventive health recommendations  were provided to patient.    Sue Lush, LPN   1/61/0960   After Visit Summary: (Declined) Due to this being a telephonic visit, with patients personalized plan was offered to patient but patient Declined AVS at this time   Notes: Nothing significant to report at this time.  Shingrix Vaccine: Patient declined vaccine.  Due, Education has been provided regarding the importance of this vaccine. Advised may receive this vaccine at local pharmacy or Health Dept. Aware to provide a copy of the vaccination record if obtained from local pharmacy or Health Dept. Verbalized acceptance and understanding.   Flu Vaccine: Patient declined flu vaccine Due, Education has been provided regarding the importance of this vaccine. Advised may receive this vaccine at local pharmacy or Health Dept. Aware to provide a copy of the vaccination record if obtained  from local pharmacy or Health Dept. Verbalized acceptance and understanding.

## 2023-11-11 NOTE — Patient Instructions (Signed)
 Timothy Bullock , Thank you for taking time to come for your Medicare Wellness Visit. I appreciate your ongoing commitment to your health goals. Please review the following plan we discussed and let me know if I can assist you in the future.   Referrals/Orders/Follow-Ups/Clinician Recommendations: none  This is a list of the screening recommended for you and due dates:  Health Maintenance  Topic Date Due   Zoster (Shingles) Vaccine (1 of 2) 04/22/1951   Pneumonia Vaccine (2 of 2 - PCV) 06/30/2012   COVID-19 Vaccine (7 - 2024-25 season) 05/17/2023   Flu Shot  12/14/2023*   Medicare Annual Wellness Visit  11/10/2024   DTaP/Tdap/Td vaccine (2 - Td or Tdap) 09/30/2031   HPV Vaccine  Aged Out  *Topic was postponed. The date shown is not the original due date.    Advanced directives: (Copy Requested) Please bring a copy of your health care power of attorney and living will to the office to be added to your chart at your convenience.  Next Medicare Annual Wellness Visit scheduled for next year: Yes 11/14/2024 @ 10:10am televisit

## 2023-12-23 ENCOUNTER — Other Ambulatory Visit: Payer: Self-pay | Admitting: Family Medicine

## 2024-04-23 ENCOUNTER — Other Ambulatory Visit: Payer: Self-pay | Admitting: Family Medicine

## 2024-04-25 NOTE — Telephone Encounter (Signed)
 LOV: 05/05/23 NOV: nothing scheduled  Last Refill: gabapentin  (NEURONTIN ) 100 MG capsule  12/25/2023 60 capsule 3 refills

## 2024-04-26 NOTE — Telephone Encounter (Signed)
 Sch cpe for 05/31/24

## 2024-04-26 NOTE — Telephone Encounter (Signed)
Sent. Thanks.  ?Please schedule yearly visit when possible.  ?

## 2024-04-26 NOTE — Telephone Encounter (Signed)
 Please call patient to schedule annual visit and let him know that his prescription has been sent. Thank you

## 2024-05-08 ENCOUNTER — Inpatient Hospital Stay
Admission: EM | Admit: 2024-05-08 | Discharge: 2024-05-10 | DRG: 699 | Disposition: A | Discharge status: Home / Self Care | Attending: Internal Medicine | Admitting: Internal Medicine

## 2024-05-08 ENCOUNTER — Other Ambulatory Visit: Payer: Self-pay

## 2024-05-08 ENCOUNTER — Emergency Department

## 2024-05-08 DIAGNOSIS — Z9079 Acquired absence of other genital organ(s): Secondary | ICD-10-CM | POA: Diagnosis not present

## 2024-05-08 DIAGNOSIS — Z87891 Personal history of nicotine dependence: Secondary | ICD-10-CM

## 2024-05-08 DIAGNOSIS — Z1152 Encounter for screening for COVID-19: Secondary | ICD-10-CM | POA: Diagnosis not present

## 2024-05-08 DIAGNOSIS — G2581 Restless legs syndrome: Secondary | ICD-10-CM | POA: Diagnosis present

## 2024-05-08 DIAGNOSIS — Z8551 Personal history of malignant neoplasm of bladder: Secondary | ICD-10-CM | POA: Diagnosis not present

## 2024-05-08 DIAGNOSIS — Z8546 Personal history of malignant neoplasm of prostate: Secondary | ICD-10-CM | POA: Diagnosis not present

## 2024-05-08 DIAGNOSIS — Z808 Family history of malignant neoplasm of other organs or systems: Secondary | ICD-10-CM | POA: Diagnosis not present

## 2024-05-08 DIAGNOSIS — I10 Essential (primary) hypertension: Secondary | ICD-10-CM | POA: Diagnosis present

## 2024-05-08 DIAGNOSIS — Y828 Other medical devices associated with adverse incidents: Secondary | ICD-10-CM | POA: Diagnosis present

## 2024-05-08 DIAGNOSIS — Z7901 Long term (current) use of anticoagulants: Secondary | ICD-10-CM

## 2024-05-08 DIAGNOSIS — G629 Polyneuropathy, unspecified: Secondary | ICD-10-CM | POA: Diagnosis present

## 2024-05-08 DIAGNOSIS — C679 Malignant neoplasm of bladder, unspecified: Secondary | ICD-10-CM | POA: Diagnosis present

## 2024-05-08 DIAGNOSIS — R531 Weakness: Principal | ICD-10-CM | POA: Diagnosis present

## 2024-05-08 DIAGNOSIS — N390 Urinary tract infection, site not specified: Secondary | ICD-10-CM | POA: Diagnosis present

## 2024-05-08 DIAGNOSIS — Z9221 Personal history of antineoplastic chemotherapy: Secondary | ICD-10-CM | POA: Diagnosis not present

## 2024-05-08 DIAGNOSIS — T83598A Infection and inflammatory reaction due to other prosthetic device, implant and graft in urinary system, initial encounter: Secondary | ICD-10-CM | POA: Diagnosis present

## 2024-05-08 DIAGNOSIS — Z79899 Other long term (current) drug therapy: Secondary | ICD-10-CM | POA: Diagnosis not present

## 2024-05-08 DIAGNOSIS — Z933 Colostomy status: Secondary | ICD-10-CM | POA: Diagnosis not present

## 2024-05-08 DIAGNOSIS — Z923 Personal history of irradiation: Secondary | ICD-10-CM

## 2024-05-08 DIAGNOSIS — Z87440 Personal history of urinary (tract) infections: Secondary | ICD-10-CM | POA: Diagnosis present

## 2024-05-08 DIAGNOSIS — T83518A Infection and inflammatory reaction due to other urinary catheter, initial encounter: Principal | ICD-10-CM | POA: Diagnosis present

## 2024-05-08 DIAGNOSIS — I480 Paroxysmal atrial fibrillation: Secondary | ICD-10-CM | POA: Diagnosis present

## 2024-05-08 DIAGNOSIS — Z811 Family history of alcohol abuse and dependence: Secondary | ICD-10-CM

## 2024-05-08 LAB — URINALYSIS, ROUTINE W REFLEX MICROSCOPIC
Bilirubin Urine: NEGATIVE
Glucose, UA: NEGATIVE mg/dL
Ketones, ur: NEGATIVE mg/dL
Nitrite: POSITIVE — AB
Protein, ur: 30 mg/dL — AB
Specific Gravity, Urine: 1.012 (ref 1.005–1.030)
Squamous Epithelial / HPF: 0 /HPF (ref 0–5)
WBC, UA: >50 WBC/hpf (ref 0–5)
pH: 6 (ref 5.0–8.0)

## 2024-05-08 LAB — RESP PANEL BY RT-PCR (RSV, FLU A&B, COVID)  RVPGX2
Influenza A by PCR: NEGATIVE
Influenza B by PCR: NEGATIVE
Resp Syncytial Virus by PCR: NEGATIVE
SARS Coronavirus 2 by RT PCR: NEGATIVE

## 2024-05-08 LAB — CBC
HCT: 36.8 % — ABNORMAL LOW (ref 39.0–52.0)
Hemoglobin: 12.2 g/dL — ABNORMAL LOW (ref 13.0–17.0)
MCH: 31.9 pg (ref 26.0–34.0)
MCHC: 33.2 g/dL (ref 30.0–36.0)
MCV: 96.1 fL (ref 80.0–100.0)
Platelets: 240 K/uL (ref 150–400)
RBC: 3.83 MIL/uL — ABNORMAL LOW (ref 4.22–5.81)
RDW: 13.8 % (ref 11.5–15.5)
WBC: 14.8 K/uL — ABNORMAL HIGH (ref 4.0–10.5)
nRBC: 0 % (ref 0.0–0.2)

## 2024-05-08 LAB — COMPREHENSIVE METABOLIC PANEL WITH GFR
ALT: 10 U/L (ref 0–44)
AST: 14 U/L — ABNORMAL LOW (ref 15–41)
Albumin: 3.8 g/dL (ref 3.5–5.0)
Alkaline Phosphatase: 56 U/L (ref 38–126)
Anion gap: 9 (ref 5–15)
BUN: 23 mg/dL (ref 8–23)
CO2: 25 mmol/L (ref 22–32)
Calcium: 9.4 mg/dL (ref 8.9–10.3)
Chloride: 107 mmol/L (ref 98–111)
Creatinine, Ser: 1.38 mg/dL — ABNORMAL HIGH (ref 0.61–1.24)
GFR, Estimated: 49 mL/min — ABNORMAL LOW (ref >60)
Glucose, Bld: 124 mg/dL — ABNORMAL HIGH (ref 70–99)
Potassium: 4.1 mmol/L (ref 3.5–5.1)
Sodium: 141 mmol/L (ref 135–145)
Total Bilirubin: 1 mg/dL (ref 0.0–1.2)
Total Protein: 7.2 g/dL (ref 6.5–8.1)

## 2024-05-08 LAB — LACTIC ACID, PLASMA: Lactic Acid, Venous: 1.1 mmol/L (ref 0.5–1.9)

## 2024-05-08 MED ORDER — ACETAMINOPHEN 650 MG RE SUPP: 650.0000 mg | Freq: Four times a day (QID) | RECTAL | Status: DC | PRN

## 2024-05-08 MED ORDER — SENNA 8.6 MG PO TABS
2.0000 | ORAL_TABLET | Freq: Every day | ORAL | Status: DC
  Administered 2024-05-08 – 2024-05-09 (×3): 17.2 mg via ORAL
  Filled 2024-05-08 (×2): qty 2

## 2024-05-08 MED ORDER — GABAPENTIN 100 MG PO CAPS
100.0000 mg | ORAL_CAPSULE | Freq: Every day | ORAL | Status: DC
  Administered 2024-05-08 – 2024-05-09 (×3): 100 mg via ORAL
  Filled 2024-05-08 (×2): qty 1

## 2024-05-08 MED ORDER — LACTATED RINGERS IV SOLN
150.0000 mL/h | INTRAVENOUS | Status: DC
  Administered 2024-05-08: 150 mL/h via INTRAVENOUS

## 2024-05-08 MED ORDER — MELATONIN 5 MG PO TABS: 5.0000 mg | ORAL_TABLET | Freq: Every evening | ORAL | Status: DC | PRN

## 2024-05-08 MED ORDER — SENNOSIDES-DOCUSATE SODIUM 8.6-50 MG PO TABS
1.0000 | ORAL_TABLET | Freq: Every evening | ORAL | Status: DC | PRN
Start: 2024-05-08 — End: 2024-05-10

## 2024-05-08 MED ORDER — ACETAMINOPHEN 500 MG PO TABS
1000.0000 mg | ORAL_TABLET | Freq: Once | ORAL | Status: DC
  Filled 2024-05-08: qty 2

## 2024-05-08 MED ORDER — ONDANSETRON HCL 4 MG/2ML IJ SOLN: 4.0000 mg | Freq: Four times a day (QID) | INTRAMUSCULAR | Status: DC | PRN

## 2024-05-08 MED ORDER — APIXABAN 5 MG PO TABS
5.0000 mg | ORAL_TABLET | Freq: Two times a day (BID) | ORAL | Status: DC
  Administered 2024-05-08 – 2024-05-10 (×5): 5 mg via ORAL
  Filled 2024-05-08 (×4): qty 1

## 2024-05-08 MED ORDER — PIPERACILLIN-TAZOBACTAM 3.375 G IVPB 30 MIN
3.3750 g | Freq: Once | INTRAVENOUS | Status: AC
  Administered 2024-05-08: 3.375 g via INTRAVENOUS
  Filled 2024-05-08: qty 50

## 2024-05-08 MED ORDER — HYDRALAZINE HCL 20 MG/ML IJ SOLN: 5.0000 mg | Freq: Four times a day (QID) | INTRAMUSCULAR | Status: DC | PRN

## 2024-05-08 MED ORDER — ACETAMINOPHEN 325 MG PO TABS
650.0000 mg | ORAL_TABLET | Freq: Four times a day (QID) | ORAL | Status: DC | PRN
Start: 2024-05-08 — End: 2024-05-13

## 2024-05-08 MED ORDER — LACTATED RINGERS IV BOLUS (SEPSIS)
1000.0000 mL | Freq: Once | INTRAVENOUS | Status: AC
  Administered 2024-05-08: 1000 mL via INTRAVENOUS

## 2024-05-08 MED ORDER — ONDANSETRON HCL 4 MG PO TABS: 4.0000 mg | ORAL_TABLET | Freq: Four times a day (QID) | ORAL | Status: DC | PRN

## 2024-05-08 MED ORDER — LACTATED RINGERS IV BOLUS
1000.0000 mL | Freq: Once | INTRAVENOUS | Status: AC
  Administered 2024-05-08: 1000 mL via INTRAVENOUS

## 2024-05-08 MED ORDER — PIPERACILLIN-TAZOBACTAM 3.375 G IVPB
3.3750 g | Freq: Three times a day (TID) | INTRAVENOUS | Status: DC
  Administered 2024-05-08 – 2024-05-10 (×5): 3.375 g via INTRAVENOUS
  Filled 2024-05-08 (×5): qty 50

## 2024-05-08 NOTE — ED Provider Notes (Signed)
 [facility] Provider Note    Event Date/Time   First MD Initiated Contact with Patient 05/08/24 1141     (approximate)   History   Weakness   HPI  Timothy Bullock is a 89 y.o. male who presents to the ED for evaluation of Weakness   Review [facility] oncology visit from April.  History of prostate cancer s/p cystectomy, A-fib on Eliquis  and amiodarone .  Also with colostomy  Patient presents to the ED from home for evaluation of generalized weakness and inability to walk.  Lives at home by himself and ambulates independently, sometimes using a walker to get around.  Symptoms started this morning, the past few hours, he reports he was unable to get out of bed as his legs (bilaterally) were dramatically weak and would not move.  He reports sliding himself down onto the floor without significant trauma, laid there for nearly 1 hour and had to use his life alert wrist bracelet to call for help as he was unable to get up.  This has never happened before.  No preceding illnesses or concerns.   Physical Exam   Triage Vital Signs: ED Triage Vitals  Encounter Vitals Group     BP 05/08/24 1044 (!) 105/56     Girls Systolic BP Percentile --      Girls Diastolic BP Percentile --      Boys Systolic BP Percentile --      Boys Diastolic BP Percentile --      Pulse Rate 05/08/24 1044 98     Resp 05/08/24 1044 17     Temp 05/08/24 1044 97.8 F (36.6 C)     Temp Source 05/08/24 1044 Oral     SpO2 05/08/24 1044 98 %     Weight 05/08/24 1047 155 lb (70.3 kg)     Height 05/08/24 1047 5' 8 (1.727 m)     Head Circumference --      Peak Flow --      Pain Score 05/08/24 1046 0     Pain Loc --      Pain Education --      Exclude from Growth Chart --     Most recent vital signs: Vitals:   05/08/24 1455 05/08/24 1519  BP:  125/68  Pulse:  86  Resp:  18  Temp: 97.8 F (36.6 C) 98 F (36.7 C)  SpO2:  100%    General: Awake, no distress.  CV:  Good peripheral  perfusion.  Resp:  Normal effort.  Abd:  No distention.  MSK:  No deformity noted.  Neuro:  No focal deficits appreciated. Other:     ED Results / Procedures / Treatments   Labs (all labs ordered are listed, but only abnormal results are displayed) Labs Reviewed  COMPREHENSIVE METABOLIC PANEL WITH GFR - Abnormal; Notable for the following components:      Result Value   Glucose, Bld 124 (*)    Creatinine, Ser 1.38 (*)    AST 14 (*)    GFR, Estimated 49 (*)    All other components within normal limits  CBC - Abnormal; Notable for the following components:   WBC 14.8 (*)    RBC 3.83 (*)    Hemoglobin 12.2 (*)    HCT 36.8 (*)    All other components within normal limits  URINALYSIS, ROUTINE W REFLEX MICROSCOPIC - Abnormal; Notable for the following components:   Color, Urine YELLOW (*)    APPearance HAZY (*)  Hgb urine dipstick SMALL (*)    Protein, ur 30 (*)    Nitrite POSITIVE (*)    Leukocytes,Ua MODERATE (*)    Bacteria, UA RARE (*)    All other components within normal limits  RESP PANEL BY RT-PCR (RSV, FLU A&B, COVID)  [id number]  URINE CULTURE  LACTIC ACID, PLASMA    EKG A-fib the rate of 100 bpm.  Right bundle, no STEMI.  RADIOLOGY CXR interpreted by me without evidence of acute cardiopulmonary pathology.  Official radiology report(s): DG Chest Portable 1 View Result Date: 05/08/2024 EXAM: 1 VIEW XRAY OF THE CHEST 05/08/2024 12:41:18 PM COMPARISON: 11/24/2007 CLINICAL HISTORY: Eval infiltrate, generalized weakness, leukocytosis. Per ER notes: Pt was not able to get themselves out of bed and had a soft fall where they eased themselves down from the bed onto the carpet and was not able to get back up. Hx of bladder cancer, prostate cancer with resection. FINDINGS: LUNGS AND PLEURA: Somewhat coarse bibasilar interstitial markings. No focal pulmonary opacity. No pulmonary edema. No pleural effusion. No pneumothorax. HEART AND MEDIASTINUM: No acute abnormality of the  cardiac and mediastinal silhouettes. Aortic atherosclerosis (ICD10-I70.0). BONES AND SOFT TISSUES: No acute osseous abnormality. Right IJ power port placement to the cavoatrial junction without pneumothorax. IMPRESSION: 1. No acute findings. 2. Right IJ power report placement to the cavoatrial junction without pneumothorax. Electronically signed by: Dayne Hassell MD 05/08/2024 01:02 PM EDT RP Workstation: [id number]    PROCEDURES and INTERVENTIONS:  Procedures  Medications  acetaminophen  (TYLENOL ) tablet 1,000 mg (1,000 mg Oral Patient Refused/Not Given 05/08/24 1153)  apixaban  (ELIQUIS ) tablet 5 mg (has no administration in time range)  lactated ringers  infusion (150 mL/hr Intravenous New Bag/Given 05/08/24 1417)  acetaminophen  (TYLENOL ) tablet 650 mg (has no administration in time range)    Or  acetaminophen  (TYLENOL ) suppository 650 mg (has no administration in time range)  ondansetron  (ZOFRAN ) tablet 4 mg (has no administration in time range)    Or  ondansetron  (ZOFRAN ) injection 4 mg (has no administration in time range)  senna-docusate (Senokot-S) tablet 1 tablet (has no administration in time range)  piperacillin -tazobactam (ZOSYN ) IVPB 3.375 g (has no administration in time range)  lactated ringers  bolus 1,000 mL (0 mLs Intravenous Stopped 05/08/24 1314)  piperacillin -tazobactam (ZOSYN ) IVPB 3.375 g (0 g Intravenous Stopped 05/08/24 1357)  lactated ringers  bolus 1,000 mL (1,000 mLs Intravenous New Bag/Given 05/08/24 1404)     IMPRESSION / MDM / ASSESSMENT AND PLAN / ED COURSE  I reviewed the triage vital signs and the nursing notes.  Differential diagnosis includes, but is not limited to, sepsis, UTI, pneumonia, dehydration, GI  {Patient presents with symptoms of an acute illness or injury that is potentially life-threatening.  89 year old male presents from home with generalized weakness and inability to ambulate acutely just today with possible UTI requiring medical admission.   No evidence of trauma or neurologic deficits, generally weak.  Urine concerning for infection, though from urostomy bag, sent for culture and started on antibiotics.  No significant metabolic derangements, leukocytosis is noted.  Consult medicine for admission      FINAL CLINICAL IMPRESSION(S) / ED DIAGNOSES   Final diagnoses:  Generalized weakness  Complicated UTI (urinary tract infection)     Rx / DC Orders   ED Discharge Orders     None        Note:  This document was prepared using Dragon voice recognition software and may include unintentional dictation errors.   Claudene Rover, MD 05/08/24 [phone]

## 2024-05-08 NOTE — ED Triage Notes (Signed)
 Pt arrives via [facility] from home with c/o weakness that started this morning when they woke up. Pt was not able to get themselves out of bed and had a soft fall where they eased themselves down from the bed onto the carpet and was not able to get themselves back up. Pt is A&Ox4 during triage.

## 2024-05-08 NOTE — Hospital Course (Addendum)
 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  Vitals in the ED showed T of 97.8, respiration rate 17, heart rate 98, blood pressure 105/56, SpO2 98% on room air.  Serum sodium is 141, potassium 4.1, chloride 107, bicarb 25, BUN of 27, serum creatinine 1.38, EGFR 49, nonfasting blood glucose 124, WBC 14.8, platelets 240, hemoglobin 12.2.  ED treatment: Acetaminophen  1000 mg p.o. one-time dose, LR 1 L bolus, Zosyn  per pharmacy.

## 2024-05-08 NOTE — Plan of Care (Signed)
  Problem: Fluid Volume: Goal: Hemodynamic stability will improve Outcome: Progressing   Problem: Clinical Measurements: Goal: Diagnostic test results will improve Outcome: Progressing Goal: Signs and symptoms of infection will decrease Outcome: Progressing   Problem: Respiratory: Goal: Ability to maintain adequate ventilation will improve Outcome: Progressing   Problem: Education: Goal: Knowledge of General Education information will improve Description: Including pain rating scale, medication(s)/side effects and non-pharmacologic comfort measures Outcome: Progressing   Problem: Activity: Goal: Risk for activity intolerance will decrease Outcome: Progressing   Problem: Elimination: Goal: Will not experience complications related to bowel motility Outcome: Progressing Goal: Will not experience complications related to urinary retention Outcome: Progressing   Problem: Skin Integrity: Goal: Risk for impaired skin integrity will decrease Outcome: Progressing

## 2024-05-08 NOTE — Assessment & Plan Note (Signed)
 Gabapentin  100 to 200 mg nightly resumed

## 2024-05-08 NOTE — ED Triage Notes (Signed)
 Pt comes via [facility] from home with weakness. Pt woke up this morning and had it. Pt did have fall today but no injuries from the weakness.  HR 84 118/68 CBG 115 Temp 97.7 97% RA  Pt is on thinners

## 2024-05-08 NOTE — H&P (Signed)
 History and Physical   Timothy Bullock FMW:[id number] DOB: 1932/08/21 DOA: 05/08/2024  PCP: Cleatus Arlyss RAMAN, MD  Outpatient Specialists: [facility] Patient coming from: Home via EMS  I have personally briefly reviewed patient's old medical records in [facility] EMR.  Chief Concern: Weakness  HPI: Timothy Bullock is a 89 year old male with history of atrial fibrillation status post cardioversion and ablation, on Eliquis , history of malignant neoplasm of urinary bladder, prostate cancer status post radiation, chemotherapy and cystectomy in 05/25/16, history of urothelial carcinoma status post resection and current colostomy status, who presents to the emergency department for chief concerns of weakness.  Vitals in the ED showed T of 97.8, respiration rate 17, heart rate 98, blood pressure 105/56, SpO2 98% on room air.  Serum sodium is 141, potassium 4.1, chloride 107, bicarb 25, BUN of 27, serum creatinine 1.38, EGFR 49, nonfasting blood glucose 124, WBC 14.8, platelets 240, hemoglobin 12.2.  ED treatment: Acetaminophen  1000 mg p.o. one-time dose, LR 1 L bolus, Zosyn  per pharmacy. -------------------------------- At bedside, patient able to tell me his first and last name, age, current calendar year, current location.  He reports he felt weak all over this morning. He denies cough, chest pain, shortness of breath, cough, fever, nausea, vomiting.  He reports he feels so much better now and the weakness has improved with the treatments in the ED.  Social history: He lives on his own. His spouse passed away in 25-May-2020. He denies tobacco, etoh, recreational drug use. He is retired and previously owned a [employer].   ROS: Constitutional: no weight change, no fever ENT/Mouth: no sore throat, no rhinorrhea Eyes: no eye pain, no vision changes Cardiovascular: no chest pain, no dyspnea,  no edema, no palpitations Respiratory: no cough, no sputum, no wheezing Gastrointestinal: no  nausea, no vomiting, no diarrhea, no constipation Genitourinary: no urinary incontinence, no dysuria, no hematuria Musculoskeletal: no arthralgias, no myalgias Skin: no skin lesions, no pruritus, Neuro: + weakness, no loss of consciousness, no syncope Psych: no anxiety, no depression, + decrease appetite Heme/Lymph: no bruising, no bleeding  ED Course: Discussed with EDP, patient requiring hospitalization for chief concerns of weakness, with complicated UTI.  Assessment/Plan  Principal Problem:   Complicated UTI (urinary tract infection) Active Problems:   Bladder cancer (HCC)   Colostomy status (HCC)   Hypertension   Paroxysmal atrial fibrillation (HCC)   RLS (restless legs syndrome)   Personal history of prostate cancer   Assessment and Plan:  * Complicated UTI (urinary tract infection) Urine culture in process Continue with Zosyn  per pharmacy LR 1 L bolus Continue with LR infusion at 150 mm/h, 20 hours of coverage ordered  Personal history of prostate cancer Continue outpatient follow-up with oncology/urology as appropriate  RLS (restless legs syndrome) Gabapentin  100 to 200 mg nightly resumed  Paroxysmal atrial fibrillation (HCC) Status post ablation Apixaban  5 mg p.o. twice daily were resumed on admission  Hypertension Hydralazine  5 mg every 6 hours as needed for SBP > 165, 5 days  Chart reviewed.   DVT prophylaxis: Apixaban  Code Status: Full code Diet: Heart healthy Family Communication: No Disposition Plan: Pending clinical course Consults called: Pharmacy Admission status: Telemetry medical, inpatient  Past Medical History:  Diagnosis Date   ARF (acute renal failure) (HCC)    Bladder cancer (HCC)    Blood in stool    Blood transfusion    Autologous donation with prostate surgery   Diverticulitis    Dysrhythmia    a-fib with  right BBB   H/O cardiac radiofrequency ablation    Neuromuscular disorder [facility])    right carpal tunnel   Paroxysmal  atrial fibrillation (HCC) 05/28/2012   s/p ablation   Prostate cancer [facility])    s/p resection   Ulcer    gastric   Urinary incontinence    Past Surgical History:  Procedure Laterality Date   BLADDER REMOVAL     CARDIAC CATHETERIZATION  2013   [facility]   CARPAL TUNNEL RELEASE Right 06/17/2023   Procedure: CARPAL TUNNEL RELEASE;  Surgeon: Romona Harari, MD;  Location: [facility];  Service: Orthopedics;  Laterality: Right;  mac and local 30 min   COLONOSCOPY  02/2012   negative pathology    COLOSTOMY     with bladder cancer surgery 2017   OTHER SURGICAL HISTORY     urostomy   PERCUTANEOUS NEPHROSTOMY  2017   PROSTATECTOMY  1993   Social History:  reports that he quit smoking about 58 years ago. His smoking use included cigarettes. He has never used smokeless tobacco. He reports current alcohol use. He reports that he does not use drugs.  No Known Allergies Family History  Problem Relation Age of Onset   Cancer Mother        H/O colon CA   Alcohol abuse Father    Cancer Father        CA of tongue   Cancer Other        colon, parents <60   Family history: Family history reviewed and not pertinent.  Prior to Admission medications   Medication Sig Start Date End Date Taking? Authorizing Provider  apixaban  (ELIQUIS ) 5 MG TABS tablet Take 1 tablet (5 mg total) by mouth 2 (two) times daily. 05/05/23  Yes Cleatus Arlyss RAMAN, MD  gabapentin  (NEURONTIN ) 100 MG capsule TAKE 1-2 CAPSULES (100-200 MG TOTAL) BY MOUTH AT BEDTIME. 04/26/24  Yes Cleatus Arlyss RAMAN, MD  ibuprofen (ADVIL) 200 MG tablet Take 400 mg by mouth every 6 (six) hours as needed.   Yes [provider]  senna ([employer] SENNA) 8.6 MG tablet Take 2 tablets (17.2 mg total) by mouth daily. 05/05/23  Yes Cleatus Arlyss RAMAN, MD   Physical Exam: Vitals:   05/08/24 1044 05/08/24 1047 05/08/24 1455 05/08/24 1519  BP: (!) 105/56   125/68  Pulse: 98   86  Resp: 17   18  Temp: 97.8 F (36.6 C)  97.8 F (36.6 C) 98  F (36.7 C)  TempSrc: Oral  Oral   SpO2: 98%   100%  Weight:  70.3 kg  70.3 kg  Height:  5' 8 (1.727 m)  5' 8 (1.727 m)   Constitutional: appears younger than chronological age, NAD, calm Eyes: PERRL, lids and conjunctivae normal ENMT: Mucous membranes are moist. Posterior pharynx clear of any exudate or lesions. Age-appropriate dentition.  Mild hearing loss bilaterally Neck: normal, supple, no masses, no thyromegaly Respiratory: clear to auscultation bilaterally, no wheezing, no crackles. Normal respiratory effort. No accessory muscle use.  Cardiovascular: Regular rate and rhythm, no murmurs / rubs / gallops. No extremity edema. 2+ pedal pulses. No carotid bruits.  Abdomen: Colostomy status, no tenderness, no masses palpated, no hepatosplenomegaly. Bowel sounds positive.  Percutaneous nephrostomy status Musculoskeletal: no clubbing / cyanosis. No joint deformity upper and lower extremities. Good ROM, no contractures, no atrophy. Normal muscle tone.  Skin: no rashes, lesions, ulcers. No induration Neurologic: Sensation intact. Strength 5/5 in all 4.  Psychiatric: Normal judgment and insight. Alert  and oriented x 3. Normal mood.   EKG: independently reviewed, showing atrial fibrillation with rate of 100, QTc 490  Chest x-ray on Admission: I personally reviewed and I agree with radiologist reading as below.  DG Chest Portable 1 View Result Date: 05/08/2024 EXAM: 1 VIEW XRAY OF THE CHEST 05/08/2024 12:41:18 PM COMPARISON: 11/24/2007 CLINICAL HISTORY: Eval infiltrate, generalized weakness, leukocytosis. Per ER notes: Pt was not able to get themselves out of bed and had a soft fall where they eased themselves down from the bed onto the carpet and was not able to get back up. Hx of bladder cancer, prostate cancer with resection. FINDINGS: LUNGS AND PLEURA: Somewhat coarse bibasilar interstitial markings. No focal pulmonary opacity. No pulmonary edema. No pleural effusion. No pneumothorax. HEART  AND MEDIASTINUM: No acute abnormality of the cardiac and mediastinal silhouettes. Aortic atherosclerosis (ICD10-I70.0). BONES AND SOFT TISSUES: No acute osseous abnormality. Right IJ power port placement to the cavoatrial junction without pneumothorax. IMPRESSION: 1. No acute findings. 2. Right IJ power report placement to the cavoatrial junction without pneumothorax. Electronically signed by: Katheleen Faes MD 05/08/2024 01:02 PM EDT RP Workstation: [id number]   Labs on Admission: I have personally reviewed following labs CBC: Recent Labs  Lab 05/08/24 1053  WBC 14.8*  HGB 12.2*  HCT 36.8*  MCV 96.1  PLT 240   Basic Metabolic Panel: Recent Labs  Lab 05/08/24 1053  NA 141  K 4.1  CL 107  CO2 25  GLUCOSE 124*  BUN 23  CREATININE 1.38*  CALCIUM 9.4   GFR: Estimated Creatinine Clearance: 34.4 mL/min (A) (by C-G formula based on SCr of 1.38 mg/dL (H)).  Liver Function Tests: Recent Labs  Lab 05/08/24 1053  AST 14*  ALT 10  ALKPHOS 56  BILITOT 1.0  PROT 7.2  ALBUMIN 3.8   Urine analysis:    Component Value Date/Time   COLORURINE YELLOW (A) 05/08/2024 1220   APPEARANCEUR HAZY (A) 05/08/2024 1220   APPEARANCEUR Cloudy 03/09/2013 0636   LABSPEC 1.012 05/08/2024 1220   LABSPEC 1.020 03/09/2013 0636   PHURINE 6.0 05/08/2024 1220   GLUCOSEU NEGATIVE 05/08/2024 1220   GLUCOSEU NEGATIVE 02/24/2023 1000   HGBUR SMALL (A) 05/08/2024 1220   BILIRUBINUR NEGATIVE 05/08/2024 1220   BILIRUBINUR negative 03/12/2016 1818   BILIRUBINUR Negative 03/09/2013 0636   KETONESUR NEGATIVE 05/08/2024 1220   PROTEINUR 30 (A) 05/08/2024 1220   UROBILINOGEN 1.0 02/24/2023 1000   NITRITE POSITIVE (A) 05/08/2024 1220   LEUKOCYTESUR MODERATE (A) 05/08/2024 1220   LEUKOCYTESUR Negative 03/09/2013 0636   This document was prepared using Dragon Voice Recognition software and may include unintentional dictation errors.  Dr. Sherre [facility]  If 7PM-7AM, please contact overnight-coverage  provider If 7AM-7PM, please contact day attending provider [email or web address]  05/08/2024, 3:44 PM

## 2024-05-08 NOTE — ED Notes (Signed)
 Pt eating lunch. Pulled pt up in bed. Hospitalist at bedside.

## 2024-05-08 NOTE — Assessment & Plan Note (Signed)
 Hydralazine  5 mg every 6 hours as needed for SBP > 165, 5 days

## 2024-05-08 NOTE — Assessment & Plan Note (Signed)
 Status post ablation Apixaban  5 mg p.o. twice daily were resumed on admission

## 2024-05-08 NOTE — Assessment & Plan Note (Signed)
 Continue outpatient follow-up with oncology/urology as appropriate

## 2024-05-08 NOTE — ED Notes (Signed)
 Visitor at bedside.

## 2024-05-08 NOTE — Assessment & Plan Note (Signed)
 Urine culture in process Continue with Zosyn  per pharmacy LR 1 L bolus Continue with LR infusion at 150 mm/h, 20 hours of coverage ordered

## 2024-05-09 DIAGNOSIS — N390 Urinary tract infection, site not specified: Secondary | ICD-10-CM | POA: Diagnosis not present

## 2024-05-09 LAB — BASIC METABOLIC PANEL WITH GFR
Anion gap: 8 (ref 5–15)
BUN: 23 mg/dL (ref 8–23)
CO2: 24 mmol/L (ref 22–32)
Calcium: 8.5 mg/dL — ABNORMAL LOW (ref 8.9–10.3)
Chloride: 104 mmol/L (ref 98–111)
Creatinine, Ser: 1.34 mg/dL — ABNORMAL HIGH (ref 0.61–1.24)
GFR, Estimated: 50 mL/min — ABNORMAL LOW (ref >60)
Glucose, Bld: 95 mg/dL (ref 70–99)
Potassium: 3.6 mmol/L (ref 3.5–5.1)
Sodium: 136 mmol/L (ref 135–145)

## 2024-05-09 LAB — PROTIME-INR
INR: 1.5 — ABNORMAL HIGH (ref 0.8–1.2)
Prothrombin Time: 18.6 s — ABNORMAL HIGH (ref 11.4–15.2)

## 2024-05-09 LAB — CBC
HCT: 33.4 % — ABNORMAL LOW (ref 39.0–52.0)
Hemoglobin: 10.9 g/dL — ABNORMAL LOW (ref 13.0–17.0)
MCH: 31.3 pg (ref 26.0–34.0)
MCHC: 32.6 g/dL (ref 30.0–36.0)
MCV: 96 fL (ref 80.0–100.0)
Platelets: 218 K/uL (ref 150–400)
RBC: 3.48 MIL/uL — ABNORMAL LOW (ref 4.22–5.81)
RDW: 13.5 % (ref 11.5–15.5)
WBC: 10.7 K/uL — ABNORMAL HIGH (ref 4.0–10.5)
nRBC: 0 % (ref 0.0–0.2)

## 2024-05-09 MED ORDER — CHLORHEXIDINE GLUCONATE CLOTH 2 % EX PADS
6.0000 | MEDICATED_PAD | Freq: Every day | CUTANEOUS | Status: DC
  Administered 2024-05-09 – 2024-05-10 (×2): 6 via TOPICAL

## 2024-05-09 MED ORDER — SODIUM CHLORIDE 0.9% FLUSH
10.0000 mL | Freq: Two times a day (BID) | INTRAVENOUS | Status: DC
  Administered 2024-05-09 – 2024-05-10 (×2): 10 mL

## 2024-05-09 MED ORDER — SODIUM CHLORIDE 0.9% FLUSH: 10.0000 mL | INTRAVENOUS | Status: DC | PRN

## 2024-05-09 MED ORDER — LACTATED RINGERS IV SOLN: INTRAVENOUS | Status: AC

## 2024-05-09 NOTE — Plan of Care (Signed)

## 2024-05-09 NOTE — NC FL2 (Signed)
 [place]  MEDICAID FL2 LEVEL OF CARE FORM     IDENTIFICATION  Patient Name: Timothy Bullock Birthdate: 11-29-1931 Sex: male Admission Date (Current Location): 05/08/2024  [facility] and [place] Number:  Chiropodist and Address:  [facility], [address], [place], [place] [address]      Provider Number: [id number]  Attending Physician Name and Address:  Trudy Anthony HERO, MD  Relative Name and Phone Number:  Vogel,Richard (Relative)  [phone]    Current Level of Care: Hospital Recommended Level of Care: Skilled Nursing Facility Prior Approval Number:    Date Approved/Denied:   PASRR Number:    Discharge Plan: SNF    Current Diagnoses: Patient Active Problem List   Diagnosis Date Noted   Complicated UTI (urinary tract infection) 05/08/2024   Healthcare maintenance 05/06/2023   CTS (carpal tunnel syndrome) 05/06/2023   Paresthesia 02/25/2023   Hematuria 02/25/2023   Hand pain 03/23/2022   Thoracic back pain 12/18/2021   Back muscle spasm 12/09/2021   RLS (restless legs syndrome) 11/04/2020   Medicare annual wellness visit, subsequent 06/29/2019   Lumbar back pain 02/12/2018   Advance care planning 10/29/2017   Hypertension 10/29/2017   History of atrial fibrillation 10/29/2017   Attention to urostomy [facility]) 03/08/2017   Colostomy status (HCC) 12/31/2016   Anemia 08/19/2016   Bladder cancer (HCC) 04/18/2016   Personal history of prostate cancer 07/13/2013   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) 07/22/2012   Paroxysmal atrial fibrillation (HCC) 05/28/2012   Gastric ulcer 07/23/2010    Orientation RESPIRATION BLADDER Height & Weight     Self    Incontinent Weight: 70.3 kg Height:  5' 8 (172.7 cm)  BEHAVIORAL SYMPTOMS/MOOD NEUROLOGICAL BOWEL NUTRITION STATUS      Colostomy Diet (Heart Diet)  AMBULATORY STATUS COMMUNICATION OF NEEDS Skin   Limited Assist Verbally                         Personal Care  Assistance Level of Assistance  Bathing, Feeding, Dressing Bathing Assistance: Limited assistance Feeding assistance: Limited assistance Dressing Assistance: Limited assistance     Functional Limitations Info             SPECIAL CARE FACTORS FREQUENCY                       Contractures      Additional Factors Info  Code Status, Allergies Code Status Info: FULL Allergies Info: NKA           Current Medications (05/09/2024):  This is the current hospital active medication list Current Facility-Administered Medications  Medication Dose Route Frequency Provider Last Rate Last Admin   acetaminophen  (TYLENOL ) tablet 650 mg  650 mg Oral Q6H PRN Cox, Amy N, DO       Or   acetaminophen  (TYLENOL ) suppository 650 mg  650 mg Rectal Q6H PRN Cox, Amy N, DO       apixaban  (ELIQUIS ) tablet 5 mg  5 mg Oral BID Cox, Amy N, DO   5 mg at 05/09/24 0840   gabapentin  (NEURONTIN ) capsule 100 mg  100 mg Oral QHS Cox, Amy N, DO   100 mg at 05/08/24 2121   hydrALAZINE  (APRESOLINE ) injection 5 mg  5 mg Intravenous Q6H PRN Cox, Amy N, DO       lactated ringers  infusion   Intravenous Continuous Trudy Anthony HERO, MD 75 mL/hr at 05/09/24 1004 New Bag at 05/09/24 1004  melatonin tablet 5 mg  5 mg Oral QHS PRN Cox, Amy N, DO       ondansetron  (ZOFRAN ) tablet 4 mg  4 mg Oral Q6H PRN Cox, Amy N, DO       Or   ondansetron  (ZOFRAN ) injection 4 mg  4 mg Intravenous Q6H PRN Cox, Amy N, DO       piperacillin -tazobactam (ZOSYN ) IVPB 3.375 g  3.375 g Intravenous Q8H Patel, Rutvi M, RPH 12.5 mL/hr at 05/09/24 0517 3.375 g at 05/09/24 0517   senna (SENOKOT) tablet 17.2 mg  2 tablet Oral QHS Cox, Amy N, DO   17.2 mg at 05/08/24 2120   senna-docusate (Senokot-S) tablet 1 tablet  1 tablet Oral QHS PRN Cox, Amy N, DO         Discharge Medications: Please see discharge summary for a list of discharge medications.  Relevant Imaging Results:  Relevant Lab Results:   Additional Information SSN:  [id number]  Dalia GORMAN Fuse, RN

## 2024-05-09 NOTE — Progress Notes (Signed)
 PROGRESS NOTE    Timothy Bullock  FMW:[id number] DOB: [birth year] DOA: 05/08/2024 PCP: Cleatus Arlyss RAMAN, MD   Assessment & Plan:   Principal Problem:   Complicated UTI (urinary tract infection) Active Problems:   Bladder cancer (HCC)   Colostomy status (HCC)   Hypertension   Paroxysmal atrial fibrillation (HCC)   RLS (restless legs syndrome)   Personal history of prostate cancer  Assessment and Plan: Complicated UTI: urine cx is pending. Continue on IV zosyn .    Hx of prostate cancer: management per onco outpatient    Possible peripheral neuropathy: continue on home dose of gabapentin    PAF: s/p ablation. Continue on home dose of eliquis . Rate controlled currently   Generalized weakness: PT/OT consulted      DVT prophylaxis: eliquis   Code Status: full  Family Communication:  Disposition Plan: likely d/c back to [facility]  Level of care: Telemetry Medical  Status is: Inpatient Remains inpatient appropriate because: severity of illness    Consultants:    Procedures:  Antimicrobials: zosyn     Subjective: Pt c/o generalized weakness  Objective: Vitals:   05/08/24 1519 05/08/24 2026 05/09/24 0000 05/09/24 0356  BP: 125/68 (!) 91/54 (!) 99/56 100/61  Pulse: 86 91 98 92  Resp: 18     Temp: 98 F (36.7 C) 98.9 F (37.2 C) 98.2 F (36.8 C) 98.2 F (36.8 C)  TempSrc:   Oral   SpO2: 100% 97% 99% 97%  Weight: 70.3 kg     Height: 5' 8 (1.727 m)       Intake/Output Summary (Last 24 hours) at 05/09/2024 0828 Last data filed at 05/09/2024 0517 Gross per 24 hour  Intake 1226.65 ml  Output 1750 ml  Net -523.35 ml   Filed Weights   05/08/24 1047 05/08/24 1519  Weight: 70.3 kg 70.3 kg    Examination:  General exam: Appears calm and comfortable  Respiratory system: Clear to auscultation. Respiratory effort normal. Cardiovascular system: irregularly irregular. No rubs, gallops or clicks. Gastrointestinal system: Abdomen is nondistended, soft and  nontender.  Normal bowel sounds heard. Central nervous system: Alert and oriented.moves all extremities  Psychiatry: Judgement and insight appear normal. Mood & affect appropriate.     Data Reviewed: I have personally reviewed following labs and imaging studies  CBC: Recent Labs  Lab 05/08/24 1053 05/09/24 0353  WBC 14.8* 10.7*  HGB 12.2* 10.9*  HCT 36.8* 33.4*  MCV 96.1 96.0  PLT 240 218   Basic Metabolic Panel: Recent Labs  Lab 05/08/24 1053 05/09/24 0353  NA 141 136  K 4.1 3.6  CL 107 104  CO2 25 24  GLUCOSE 124* 95  BUN 23 23  CREATININE 1.38* 1.34*  CALCIUM 9.4 8.5*   GFR: Estimated Creatinine Clearance: 35.4 mL/min (A) (by C-G formula based on SCr of 1.34 mg/dL (H)). Liver Function Tests: Recent Labs  Lab 05/08/24 1053  AST 14*  ALT 10  ALKPHOS 56  BILITOT 1.0  PROT 7.2  ALBUMIN 3.8   No results for input(s): LIPASE, AMYLASE in the last 168 hours. No results for input(s): AMMONIA in the last 168 hours. Coagulation Profile: Recent Labs  Lab 05/09/24 0353  INR 1.5*   Cardiac Enzymes: No results for input(s): CKTOTAL, CKMB, CKMBINDEX, TROPONINI in the last 168 hours. BNP (last 3 results) No results for input(s): PROBNP in the last 8760 hours. HbA1C: No results for input(s): HGBA1C in the last 72 hours. CBG: No results for input(s): GLUCAP in the last 168 hours. Lipid  Profile: No results for input(s): CHOL, HDL, LDLCALC, TRIG, CHOLHDL, LDLDIRECT in the last 72 hours. Thyroid  Function Tests: No results for input(s): TSH, T4TOTAL, FREET4, T3FREE, THYROIDAB in the last 72 hours. Anemia Panel: No results for input(s): VITAMINB12, FOLATE, FERRITIN, TIBC, IRON, RETICCTPCT in the last 72 hours. Sepsis Labs: Recent Labs  Lab 05/08/24 1411  LATICACIDVEN 1.1    Recent Results (from the past 240 hours)  Resp panel by RT-PCR (RSV, Flu A&B, Covid) Anterior Nasal Swab     Status: None   Collection  Time: 05/08/24 11:43 AM   Specimen: Anterior Nasal Swab  Result Value Ref Range Status   SARS Coronavirus 2 by RT PCR NEGATIVE NEGATIVE Final    Comment: (NOTE) SARS-CoV-2 target nucleic acids are NOT DETECTED.  The SARS-CoV-2 RNA is generally detectable in upper respiratory specimens during the acute phase of infection. The lowest concentration of SARS-CoV-2 viral copies this assay can detect is 138 copies/mL. A negative result does not preclude SARS-Cov-2 infection and should not be used as the sole basis for treatment or other patient management decisions. A negative result may occur with  improper specimen collection/handling, submission of specimen other than nasopharyngeal swab, presence of viral mutation(s) within the areas targeted by this assay, and inadequate number of viral copies(<138 copies/mL). A negative result must be combined with clinical observations, patient history, and epidemiological information. The expected result is Negative.  Fact Sheet for Patients:  BloggerCourse.com  Fact Sheet for Healthcare Providers:  SeriousBroker.it  This test is no t yet approved or cleared by the [place]  FDA and  has been authorized for detection and/or diagnosis of SARS-CoV-2 by FDA under an Emergency Use Authorization (EUA). This EUA will remain  in effect (meaning this test can be used) for the duration of the COVID-19 declaration under Section 564(b)(1) of the Act, 21 U.S.C.section 360bbb-3(b)(1), unless the authorization is terminated  or revoked sooner.       Influenza A by PCR NEGATIVE NEGATIVE Final   Influenza B by PCR NEGATIVE NEGATIVE Final    Comment: (NOTE) The Xpert Xpress SARS-CoV-2/FLU/RSV plus assay is intended as an aid in the diagnosis of influenza from Nasopharyngeal swab specimens and should not be used as a sole basis for treatment. Nasal washings and aspirates are unacceptable for Xpert Xpress  SARS-CoV-2/FLU/RSV testing.  Fact Sheet for Patients: BloggerCourse.com  Fact Sheet for Healthcare Providers: SeriousBroker.it  This test is not yet approved or cleared by the [place]  FDA and has been authorized for detection and/or diagnosis of SARS-CoV-2 by FDA under an Emergency Use Authorization (EUA). This EUA will remain in effect (meaning this test can be used) for the duration of the COVID-19 declaration under Section 564(b)(1) of the Act, 21 U.S.C. section 360bbb-3(b)(1), unless the authorization is terminated or revoked.     Resp Syncytial Virus by PCR NEGATIVE NEGATIVE Final    Comment: (NOTE) Fact Sheet for Patients: BloggerCourse.com  Fact Sheet for Healthcare Providers: SeriousBroker.it  This test is not yet approved or cleared by the [place]  FDA and has been authorized for detection and/or diagnosis of SARS-CoV-2 by FDA under an Emergency Use Authorization (EUA). This EUA will remain in effect (meaning this test can be used) for the duration of the COVID-19 declaration under Section 564(b)(1) of the Act, 21 U.S.C. section 360bbb-3(b)(1), unless the authorization is terminated or revoked.  Performed at [facility], [address] [place], [place] [address]          Radiology Studies:  DG Chest Portable 1 View Result Date: 05/08/2024 EXAM: 1 VIEW XRAY OF THE CHEST 05/08/2024 12:41:18 PM COMPARISON: 11/24/2007 CLINICAL HISTORY: Eval infiltrate, generalized weakness, leukocytosis. Per ER notes: Pt was not able to get themselves out of bed and had a soft fall where they eased themselves down from the bed onto the carpet and was not able to get back up. Hx of bladder cancer, prostate cancer with resection. FINDINGS: LUNGS AND PLEURA: Somewhat coarse bibasilar interstitial markings. No focal pulmonary opacity. No pulmonary edema. No pleural  effusion. No pneumothorax. HEART AND MEDIASTINUM: No acute abnormality of the cardiac and mediastinal silhouettes. Aortic atherosclerosis (ICD10-I70.0). BONES AND SOFT TISSUES: No acute osseous abnormality. Right IJ power port placement to the cavoatrial junction without pneumothorax. IMPRESSION: 1. No acute findings. 2. Right IJ power report placement to the cavoatrial junction without pneumothorax. Electronically signed by: Dayne Hassell MD 05/08/2024 01:02 PM EDT RP Workstation: [id number]        Scheduled Meds:  apixaban   5 mg Oral BID   gabapentin   100 mg Oral QHS   senna  2 tablet Oral QHS   Continuous Infusions:  lactated ringers  150 mL/hr (05/08/24 1417)   piperacillin -tazobactam (ZOSYN )  IV 3.375 g (05/09/24 0517)     LOS: 1 day       Anthony CHRISTELLA Pouch, MD [facility] Pager [phone]  If 7PM-7AM, please contact night-coverage 05/09/2024, 8:28 AM

## 2024-05-09 NOTE — Progress Notes (Signed)
 Spoke with Marolyn from pharmacy to verify compatibility with Zosyn  and LR. [employer] states 2 out of 3 compatibility for y site connections. Marolyn states It should not be a problem to y-site it in.

## 2024-05-09 NOTE — Evaluation (Signed)
 Physical Therapy Evaluation Patient Details Name: Timothy Bullock MRN: [id number] DOB: 07-Jan-1932 Today's Date: 05/09/2024  History of Present Illness  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   Clinical Impression  Pt admitted with above diagnosis. Pt currently with functional limitations due to the deficits listed below (see PT Problem List). Pt received upright in recliner agreeable to PT services. Reports PTA being fully independent at baseline and still driving.   To date, pt able to stand to IV pole at supervision. Pt utilizes BUE support on IV pole ambulating ~ 360'. Pt does have some mild drifting and worsening anterior trunk lean as pt fatigues. However pt is able to adjust gait speed and adjust gait deficits as able to regain balance. Pt's max HR reaches ~130 BPM but averages in low 100's to 115 BPM. Discussed use of personal RW as needed for energy conservation and reducing risk for falls while pt is acutely weak. Pt is agreeable. Pt left in recliner with all needs in reach. Pt will benefit from skilled PT services to address acute weakness to maximize return to PLOF.       If plan is discharge home, recommend the following: A little help with walking and/or transfers;Assist for transportation;Assistance with cooking/housework;Help with stairs or ramp for entrance   Can travel by private vehicle        Equipment Recommendations None recommended by PT  Recommendations for Other Services       Functional Status Assessment Patient has had a recent decline in their functional status and demonstrates the ability to  make significant improvements in function in a reasonable and predictable amount of time.     Precautions / Restrictions Precautions Precautions: Fall Restrictions Weight Bearing Restrictions Per Provider Order: No      Mobility  Bed Mobility               General bed mobility comments: NT. In recliner pre and post session Patient Response: Cooperative  Transfers Overall transfer level: Needs assistance   Transfers: Sit to/from Stand Sit to Stand: Supervision                Ambulation/Gait Ambulation/Gait assistance: Supervision Gait Distance (Feet): 360 Feet Assistive device: IV Pole Gait Pattern/deviations: Step-through pattern, Trunk flexed, Drifts right/left       General Gait Details: good tolerance for 2 laps artound NSG station. Is generally kyphotic and has mild drifting R/L but able to self correct  Stairs            Wheelchair Mobility     Tilt Bed Tilt Bed Patient Response: Cooperative  Modified Rankin (Stroke Patients Only)       Balance Overall balance assessment: Needs assistance Sitting-balance support: No upper extremity supported, Feet supported Sitting balance-Leahy Scale: Good     Standing balance support: Single extremity supported Standing balance-Leahy Scale: Fair Standing balance comment: SUE support on IV pole with static standing                             Pertinent Vitals/Pain Pain Assessment Pain Assessment: No/denies pain    Home Living  Additional Comments: ILF at [facility]    Prior Function Prior Level of Function : Independent/Modified Independent;Driving                     Extremity/Trunk Assessment   Upper Extremity Assessment Upper Extremity Assessment: Defer to OT evaluation    Lower Extremity Assessment Lower Extremity Assessment: Generalized weakness    Cervical / Trunk Assessment Cervical / Trunk Assessment: Kyphotic  Communication    Communication Communication: No apparent difficulties    Cognition Arousal: Alert Behavior During Therapy: WFL for tasks assessed/performed   PT - Cognitive impairments: No apparent impairments                         Following commands: Intact       Cueing Cueing Techniques: Verbal cues     General Comments General comments (skin integrity, edema, etc.): max HR up to 130's with gait. Typically ranging low 100's to 115 BPM.    Exercises Other Exercises Other Exercises: Role of PT in acute setting, DME use for reduce falls risk.   Assessment/Plan    PT Assessment Patient needs continued PT services  PT Problem List Decreased strength;Decreased activity tolerance;Decreased balance       PT Treatment Interventions DME instruction;Therapeutic exercise;Gait training;Balance training;Neuromuscular re-education;Functional mobility training;Therapeutic activities;Patient/family education    PT Goals (Current goals can be found in the Care Plan section)  Acute Rehab PT Goals Patient Stated Goal: to return to [facility] PT Goal Formulation: With patient Time For Goal Achievement: 05/23/24 Potential to Achieve Goals: Good    Frequency Min 2X/week     Co-evaluation               AM-PAC PT 6 Clicks Mobility  Outcome Measure Help needed turning from your back to your side while in a flat bed without using bedrails?: A Little Help needed moving from lying on your back to sitting on the side of a flat bed without using bedrails?: A Little Help needed moving to and from a bed to a chair (including a wheelchair)?: A Little Help needed standing up from a chair using your arms (e.g., wheelchair or bedside chair)?: A Little Help needed to walk in hospital room?: A Little Help needed climbing 3-5 steps with a railing? : A Little 6 Click Score: 18    End of Session Equipment Utilized During Treatment: Gait belt Activity Tolerance: Patient tolerated treatment  well Patient left: in chair;with call bell/phone within reach;with chair alarm set Nurse Communication: Mobility status PT Visit Diagnosis: Muscle weakness (generalized) (M62.81);Difficulty in walking, not elsewhere classified (R26.2)    Time: 1130-1146 PT Time Calculation (min) (ACUTE ONLY): 16 min   Charges:   PT Evaluation $PT Eval Low Complexity: 1 Low   PT General Charges $$ ACUTE PT VISIT: 1 Visit         Kimiya Brunelle M. Fairly IV, PT, DPT Physical Therapist- [place]  [facility] 05/09/2024, 12:53 PM

## 2024-05-09 NOTE — TOC Initial Note (Addendum)
 Transition of Care [facility]) - Initial/Assessment Note    Patient Details  Name: Timothy Bullock MRN: [id number] Date of Birth: [birth year]  [facility]) CM/SW Contact:    Dalia GORMAN Fuse, RN Phone Number: 05/09/2024, 11:15 AM  Clinical Narrative:                 Patient is from ILF at [facility], [facility] spoke with Alfonso Ouch at [facility] to confirm. TOC outreached to family member, [name] and lvmm requesting a callback. Therapy evals are pending at this time. TOC will continue to follow.  Expected Discharge Plan: Home/Self Care Barriers to Discharge: Continued Medical Work up   Patient Goals and CMS Choice            Expected Discharge Plan and Services       Living arrangements for the past 2 months: Single Family Home                                      Prior Living Arrangements/Services Living arrangements for the past 2 months: Single Family Home                     Activities of Daily Living   ADL Screening (condition at time of admission) Independently performs ADLs?: Yes (appropriate for developmental age) Is the patient deaf or have difficulty hearing?: Yes Does the patient have difficulty seeing, even when wearing glasses/contacts?: No Does the patient have difficulty concentrating, remembering, or making decisions?: No  Permission Sought/Granted                  Emotional Assessment              Admission diagnosis:  Complicated UTI (urinary tract infection) [N39.0] Patient Active Problem List   Diagnosis Date Noted   Complicated UTI (urinary tract infection) 05/08/2024   Healthcare maintenance 05/06/2023   CTS (carpal tunnel syndrome) 05/06/2023   Paresthesia 02/25/2023   Hematuria 02/25/2023   Hand pain 03/23/2022   Thoracic back pain 12/18/2021   Back muscle spasm 12/09/2021   RLS (restless legs syndrome) 11/04/2020   Medicare annual wellness visit, subsequent 06/29/2019   Lumbar back pain  02/12/2018   Advance care planning 10/29/2017   Hypertension 10/29/2017   History of atrial fibrillation 10/29/2017   Attention to urostomy (HCC) 03/08/2017   Colostomy status (HCC) 12/31/2016   Anemia 08/19/2016   Bladder cancer (HCC) 04/18/2016   Personal history of prostate cancer 07/13/2013   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) 07/22/2012   Paroxysmal atrial fibrillation (HCC) 05/28/2012   Gastric ulcer 07/23/2010   PCP:  Cleatus Arlyss GORMAN, MD Pharmacy:   [facility] [phone] - [address] [place] - [address] [place] [address] Phone: [phone] Fax: [phone]  [facility] - [place], [place] - [address] [place] [address] Phone: [phone] Fax: [phone]  [facility] - [place], [place] - [address] [facility] [address] [place] [address] [place] [address] Phone: [phone] Fax: [phone]     Social Drivers of Health (SDOH) Social History: SDOH Screenings   Food Insecurity: No Food Insecurity (05/08/2024)  Housing: Low Risk  (05/08/2024)  Transportation Needs: No Transportation Needs (05/08/2024)  Utilities: Not At Risk (05/08/2024)  Alcohol Screen: Low Risk  (11/11/2023)  Depression (PHQ2-9): Low Risk  (  11/11/2023)  Financial Resource Strain: Low Risk  (11/11/2023)  Physical Activity: Sufficiently Active (11/11/2023)  Social Connections: Moderately Integrated (05/08/2024)  Stress: No Stress Concern Present (11/11/2023)  Tobacco Use: Medium Risk (05/08/2024)  Health Literacy: Adequate Health Literacy (11/11/2023)   SDOH Interventions:     Readmission Risk Interventions     No data to display

## 2024-05-09 NOTE — Evaluation (Signed)
 Occupational Therapy Evaluation Patient Details Name: JAMIR RONE MRN: [id number] DOB: Apr 25, 1932 Today's Date: 05/09/2024   History of Present Illness   Mr. Trexton Escamilla is a 89 year old male with history of atrial fibrillation status post cardioversion and ablation, on Eliquis , history of malignant neoplasm of urinary bladder, prostate cancer status post radiation, chemotherapy and cystectomy in 2017, history of urothelial carcinoma status post resection and current colostomy status, who presents to the emergency department for chief concerns of weakness.     Clinical Impressions Patient presenting with decreased Ind in self care,balance, functional mobility/transfers, endurance, and safety awareness. Pt reports living at [facility] and being very active at baseline. He uses rollator at baseline in community as needed. Pt endorses playing pickle ball and table tennis. Pt ambulates while pushing IV pole with min guard 150' but unable to release and walk without use of AD. Pt endorses feeling far from his baseline and fatiguing quickly during session. Pt returns to room and sit in recliner chair at end of session with call bell and all needed items within reach.  Patient will benefit from acute OT to increase overall independence in the areas of ADLs, functional mobility, and safety awareness in order to safely discharge.     If plan is discharge home, recommend the following:   A little help with walking and/or transfers;A little help with bathing/dressing/bathroom;Assistance with cooking/housework;Assist for transportation     Functional Status Assessment   Patient has had a recent decline in their functional status and demonstrates the ability to make significant improvements in function in a reasonable and predictable amount of time.     Equipment Recommendations   None recommended by OT      Precautions/Restrictions   Precautions Precautions:  Fall     Mobility Bed Mobility               General bed mobility comments: NT. In recliner pre and post session    Transfers     Transfers: Sit to/from Stand Sit to Stand: Supervision                  Balance Overall balance assessment: Needs assistance Sitting-balance support: No upper extremity supported, Feet supported Sitting balance-Leahy Scale: Good     Standing balance support: Single extremity supported Standing balance-Leahy Scale: Fair Standing balance comment: SUE support on IV pole with static standing                           ADL either performed or assessed with clinical judgement   ADL Overall ADL's : Needs assistance/impaired                         Toilet Transfer: Contact guard assist;Ambulation                   Vision Patient Visual Report: No change from baseline              Pertinent Vitals/Pain Pain Assessment Pain Assessment: No/denies pain     Extremity/Trunk Assessment Upper Extremity Assessment Upper Extremity Assessment: Defer to OT evaluation   Lower Extremity Assessment Lower Extremity Assessment: Generalized weakness   Cervical / Trunk Assessment Cervical / Trunk Assessment: Kyphotic   Communication Communication Communication: No apparent difficulties   Cognition Arousal: Alert Behavior During Therapy: WFL for tasks assessed/performed Cognition: No apparent impairments  Following commands: Intact       Cueing  General Comments   Cueing Techniques: Verbal cues  max HR up to 130's with gait. Typically ranging low 100's to 115 BPM.           Home Living Family/patient expects to be discharged to:: Private residence Living Arrangements: Alone Available Help at Discharge: Available PRN/intermittently;Family Type of Home: House ([facility])       Home Layout: One level     Bathroom Shower/Tub: Health and safety inspector: Rollator (4 wheels);Shower seat   Additional Comments: ILF at [facility]      Prior Functioning/Environment Prior Level of Function : Independent/Modified Independent;Driving             Mobility Comments: uses rollator in community      OT Problem List: Decreased strength;Impaired balance (sitting and/or standing);Decreased safety awareness;Decreased activity tolerance   OT Treatment/Interventions: Self-care/ADL training;Therapeutic exercise;Patient/family education;Balance training;Energy conservation;Therapeutic activities      OT Goals(Current goals can be found in the care plan section)   Acute Rehab OT Goals Patient Stated Goal: to go to rehab at discharge OT Goal Formulation: With patient Time For Goal Achievement: 05/23/24 Potential to Achieve Goals: Fair ADL Goals Pt Will Perform Grooming: with modified independence;standing Pt Will Perform Lower Body Dressing: with modified independence;sit to/from stand Pt Will Transfer to Toilet: with modified independence;ambulating Pt Will Perform Toileting - Clothing Manipulation and hygiene: with modified independence;sit to/from stand   OT Frequency:  Min 2X/week       AM-PAC OT 6 Clicks Daily Activity     Outcome Measure Help from another person eating meals?: None Help from another person taking care of personal grooming?: None Help from another person toileting, which includes using toliet, bedpan, or urinal?: A Little Help from another person bathing (including washing, rinsing, drying)?: A Little Help from another person to put on and taking off regular upper body clothing?: A Little Help from another person to put on and taking off regular lower body clothing?: A Little 6 Click Score: 20   End of Session Nurse Communication: Mobility status  Activity Tolerance: Patient tolerated treatment well Patient left: in bed;with call bell/phone within reach;with bed alarm set  OT Visit  Diagnosis: Unsteadiness on feet (R26.81)                Time: 1000-1023 OT Time Calculation (min): 23 min Charges:  OT General Charges $OT Visit: 1 Visit OT Evaluation $OT Eval Low Complexity: 1 Low OT Treatments $Self Care/Home Management : 8-22 mins  Izetta Claude, MS, OTR/L , CBIS ascom [phone]  05/09/24, 2:57 PM

## 2024-05-10 DIAGNOSIS — N390 Urinary tract infection, site not specified: Secondary | ICD-10-CM | POA: Diagnosis not present

## 2024-05-10 LAB — CBC
HCT: 32 % — ABNORMAL LOW (ref 39.0–52.0)
Hemoglobin: 10.6 g/dL — ABNORMAL LOW (ref 13.0–17.0)
MCH: 31.4 pg (ref 26.0–34.0)
MCHC: 33.1 g/dL (ref 30.0–36.0)
MCV: 94.7 fL (ref 80.0–100.0)
Platelets: 226 K/uL (ref 150–400)
RBC: 3.38 MIL/uL — ABNORMAL LOW (ref 4.22–5.81)
RDW: 13.5 % (ref 11.5–15.5)
WBC: 8.2 K/uL (ref 4.0–10.5)
nRBC: 0 % (ref 0.0–0.2)

## 2024-05-10 LAB — URINE CULTURE: Culture: >=100000 — AB

## 2024-05-10 LAB — BASIC METABOLIC PANEL WITH GFR
Anion gap: 11 (ref 5–15)
BUN: 22 mg/dL (ref 8–23)
CO2: 24 mmol/L (ref 22–32)
Calcium: 8.4 mg/dL — ABNORMAL LOW (ref 8.9–10.3)
Chloride: 107 mmol/L (ref 98–111)
Creatinine, Ser: 1.33 mg/dL — ABNORMAL HIGH (ref 0.61–1.24)
GFR, Estimated: 51 mL/min — ABNORMAL LOW (ref >60)
Glucose, Bld: 92 mg/dL (ref 70–99)
Potassium: 3.5 mmol/L (ref 3.5–5.1)
Sodium: 142 mmol/L (ref 135–145)

## 2024-05-10 MED ORDER — HEPARIN SOD (PORK) LOCK FLUSH 100 UNIT/ML IV SOLN
500.0000 [IU] | Freq: Once | INTRAVENOUS | Status: DC
  Filled 2024-05-10: qty 5

## 2024-05-10 MED ORDER — CIPROFLOXACIN HCL 500 MG PO TABS: 500.0000 mg | ORAL_TABLET | Freq: Two times a day (BID) | ORAL | 0 refills | Status: AC

## 2024-05-10 NOTE — Progress Notes (Signed)
 Mobility Specialist - Progress Note     05/10/24 0940  Mobility  Activity Stood at bedside;Dangled on edge of bed;Ambulated independently  Level of Assistance Modified independent, requires aide device or extra time  Assistive Device Front wheel walker  Distance Ambulated (ft) 480 ft  Range of Motion/Exercises Active  Activity Response Tolerated well  Mobility Referral Yes  Mobility visit 1 Mobility  Mobility Specialist Start Time (ACUTE ONLY) [id number]  Mobility Specialist Stop Time (ACUTE ONLY) 0946  Mobility Specialist Time Calculation (min) (ACUTE ONLY) 19 min   Pt resting in recliner on RA upon entry. Pt STS and ambulates to hallway around NS for 3 laps with RW ModI. Pt had no LOB or trouble with ambulation. Pt returned to recliner and left with needs in reach. Chair alarm activated.   Guido Rumble Mobility Specialist 05/10/24, 9:52 AM

## 2024-05-10 NOTE — Progress Notes (Signed)
 Occupational Therapy Treatment Patient Details Name: Timothy Bullock MRN: [id number] DOB: 11-Apr-1932 Today's Date: 05/10/2024   History of present illness Mr. Tayden Nichelson is a 89 year old male with history of atrial fibrillation status post cardioversion and ablation, on Eliquis , history of malignant neoplasm of urinary bladder, prostate cancer status post radiation, chemotherapy and cystectomy in 2017, history of urothelial carcinoma status post resection and current colostomy status, who presents to the emergency department for chief concerns of weakness.   OT comments  Pt seen for OT treatment on this date. Upon arrival to room pt seated in recliner, agreeable to tx. Pt completed functional mobility at RW level around RN station with SBA required overall while discussing home safety and fall risk management.  Pt required cuing for safety with placement of self and hand placement to return to recliner as he sat prematurely and had to repositiong further onto recliner chair.  HR monitored throughout session at 105 following extended mobility - level of exertion and elevation in HR is as expected for activity.  Pt reported that he is returning to [facility] today.   Pt making good progress toward goals, will continue to follow POC. Discharge recommendation remains appropriate.        If plan is discharge home, recommend the following:  A little help with walking and/or transfers;A little help with bathing/dressing/bathroom;Assistance with cooking/housework;Assist for transportation   Equipment Recommendations       Recommendations for Other Services      Precautions / Restrictions Precautions Precautions: Fall Restrictions Weight Bearing Restrictions Per Provider Order: No       Mobility Bed Mobility               General bed mobility comments: NT. In recliner pre and post session    Transfers Overall transfer level: Needs assistance Equipment used: Rolling walker (2  wheels) Transfers: Sit to/from Stand Sit to Stand: Supervision           General transfer comment: Verbal cuing for safety with placement of self during transfers to reduce risk of falls.     Balance Overall balance assessment: Needs assistance Sitting-balance support: No upper extremity supported, Feet supported Sitting balance-Leahy Scale: Good     Standing balance support: Single extremity supported, No upper extremity supported Standing balance-Leahy Scale: Fair                             ADL either performed or assessed with clinical judgement   ADL Overall ADL's : Needs assistance/impaired                         Toilet Transfer: Supervision/safety;Ambulation;Rolling walker (2 wheels) Toilet Transfer Details (indicate cue type and reason): SBA at RW level with cuing for safety awareness         Functional mobility during ADLs: Rolling walker (2 wheels);Cueing for safety;Supervision/safety;Contact guard assist General ADL Comments: SBA    Extremity/Trunk Assessment Upper Extremity Assessment Upper Extremity Assessment: Generalized weakness            Vision Patient Visual Report: No change from baseline     Perception     Praxis     Communication Communication Communication: No apparent difficulties   Cognition Arousal: Alert Behavior During Therapy: WFL for tasks assessed/performed Cognition: No apparent impairments  Following commands: Intact        Cueing   Cueing Techniques: Verbal cues  Exercises Other Exercises Other Exercises: Education on safety with ADL transfers as pt sat prematurely when returning to sit in recliner.  Pt very receptive to education on safety awareness to reduce risk of falls.    Shoulder Instructions       General Comments      Pertinent Vitals/ Pain       Pain Assessment Pain Assessment: No/denies pain  Home Living                                           Prior Functioning/Environment              Frequency  Min 2X/week        Progress Toward Goals  OT Goals(current goals can now be found in the care plan section)  Progress towards OT goals: Progressing toward goals  Acute Rehab OT Goals Potential to Achieve Goals: Good  Plan      Co-evaluation                 AM-PAC OT 6 Clicks Daily Activity     Outcome Measure   Help from another person eating meals?: None Help from another person taking care of personal grooming?: None Help from another person toileting, which includes using toliet, bedpan, or urinal?: A Little Help from another person bathing (including washing, rinsing, drying)?: A Little Help from another person to put on and taking off regular upper body clothing?: A Little Help from another person to put on and taking off regular lower body clothing?: A Little 6 Click Score: 20    End of Session Equipment Utilized During Treatment: Rolling walker (2 wheels)  OT Visit Diagnosis: Unsteadiness on feet (R26.81)   Activity Tolerance Patient tolerated treatment well   Patient Left in chair;with call bell/phone within reach;with chair alarm set;with nursing/sitter in room (RN entering room at end of session.)   Nurse Communication Mobility status        Time: 8684-8671 OT Time Calculation (min): 13 min  Charges: OT General Charges $OT Visit: 1 Visit OT Treatments $Self Care/Home Management : 8-22 mins  Harlene Sharps OTR/L   Harlene LITTIE Sharps 05/10/2024, 1:42 PM

## 2024-05-10 NOTE — Discharge Summary (Addendum)
 Physician Discharge Summary  Timothy Bullock FMW:[id number] DOB: 26-Jul-1932 DOA: 05/08/2024  PCP: Cleatus Arlyss RAMAN, MD  Admit date: 05/08/2024 Discharge date: 05/10/2024  Admitted From: home Disposition:  home   Recommendations for Outpatient Follow-up:  Follow up with PCP in 1-2 weeks Will need outpatient PT & OT   Home Health: Equipment/Devices:  Discharge Condition: stable  CODE STATUS: full  Diet recommendation: Heart Healthy  Brief/Interim Summary: HPI was taken from Dr. Sherre: 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   Vitals in the ED showed T of 97.8, respiration rate 17, heart rate 98, blood pressure 105/56, SpO2 98% on room air.   Serum sodium is 141, potassium 4.1, chloride 107, bicarb 25, BUN of 27, serum creatinine 1.38, EGFR 49, nonfasting blood glucose 124, WBC 14.8, platelets 240, hemoglobin 12.2.   ED treatment: Acetaminophen  1000 mg p.o. one-time dose, LR 1 L bolus, Zosyn  per pharmacy. -------------------------------- At bedside, patient able to tell me his first and last name, age, current calendar year, current location.   He reports he felt weak all over this morning. He denies cough, chest pain, shortness of breath, cough, fever, nausea, vomiting.   He reports he feels so much better now and the weakness has improved with the treatments in the ED.  Discharge Diagnoses:  Principal Problem:   Complicated UTI (urinary tract infection) Active Problems:   Bladder cancer (HCC)   Colostomy status (HCC)   Hypertension   Paroxysmal atrial fibrillation (HCC)   RLS (restless legs syndrome)    Personal history of prostate cancer  Complicated UTI: likely secondary to urostomy. Urine cx is growing e.coli. Continue on IV zosyn  while inpatient and d/c home on po cipro  to complete the course.    Hx of prostate cancer: management per onco outpatient    Possible peripheral neuropathy: continue on home dose of gabapentin    PAF: s/p ablation. Continue on home dose of eliquis . Rate controlled currently   Generalized weakness: PT/OT recs home health vs outpatient PT/OT  Discharge Instructions  Discharge Instructions     Diet - low sodium heart healthy   Complete by: As directed    Discharge instructions   Complete by: As directed    F/u w/ PCP in 1-2 weeks   Increase activity slowly   Complete by: As directed       Allergies as of 05/10/2024   No Known Allergies      Medication List     STOP taking these medications    ibuprofen 200 MG tablet Commonly known as: ADVIL       TAKE these medications    apixaban  5 MG Tabs tablet Commonly known as: Eliquis  Take 1 tablet (5 mg total) by mouth 2 (two) times daily.   ciprofloxacin  500 MG tablet Commonly known as: Cipro  Take 1 tablet (500 mg total) by mouth 2 (two) times daily for 5 days.   gabapentin  100 MG capsule Commonly known as: NEURONTIN  TAKE 1-2 CAPSULES (100-200 MG TOTAL) BY MOUTH AT BEDTIME.   senna 8.6 MG tablet Commonly known as: [facility] Senna Take 2 tablets (17.2 mg total) by mouth daily.        No Known Allergies  Consultations:  Procedures/Studies: DG Chest Portable 1 View Result Date: 05/08/2024 EXAM: 1 VIEW XRAY OF THE CHEST 05/08/2024 12:41:18 PM COMPARISON: 11/24/2007 CLINICAL HISTORY: Eval infiltrate, generalized weakness, leukocytosis. Per ER notes: Pt was not able to get themselves out of bed and had a soft fall where they eased themselves down from the bed onto the carpet and was not able to get back up. Hx of bladder cancer, prostate cancer with resection. FINDINGS: LUNGS AND  PLEURA: Somewhat coarse bibasilar interstitial markings. No focal pulmonary opacity. No pulmonary edema. No pleural effusion. No pneumothorax. HEART AND MEDIASTINUM: No acute abnormality of the cardiac and mediastinal silhouettes. Aortic atherosclerosis (ICD10-I70.0). BONES AND SOFT TISSUES: No acute osseous abnormality. Right IJ power port placement to the cavoatrial junction without pneumothorax. IMPRESSION: 1. No acute findings. 2. Right IJ power report placement to the cavoatrial junction without pneumothorax. Electronically signed by: Katheleen Faes MD 05/08/2024 01:02 PM EDT RP Workstation: [id number]   (Echo, Carotid, EGD, Colonoscopy, ERCP)    Subjective:   Discharge Exam: Vitals:   05/10/24 0619 05/10/24 0820  BP: 111/74 121/74  Pulse: 87 88  Resp: 15 18  Temp: 98 F (36.7 C) 97.7 F (36.5 C)  SpO2: 94% 97%   Vitals:   05/09/24 1926 05/09/24 2348 05/10/24 0619 05/10/24 0820  BP: 124/61 105/64 111/74 121/74  Pulse: 99 91 87 88  Resp:   15 18  Temp: 99.2 F (37.3 C) 98.6 F (37 C) 98 F (36.7 C) 97.7 F (36.5 C)  TempSrc: Oral Oral    SpO2: 97% 97% 94% 97%  Weight:      Height:        General: Pt is alert, awake, not in acute distress Cardiovascular: S1/S2 +, no rubs, no gallops Respiratory: CTA bilaterally, no wheezing, no rhonchi Abdominal: Soft, NT, ND, bowel sounds + Extremities: no edema, no cyanosis    The results of significant diagnostics from this hospitalization (including imaging, microbiology, ancillary and laboratory) are listed below for reference.     Microbiology: Recent Results (from the past 240 hours)  Resp panel by RT-PCR (RSV, Flu A&B, Covid) Anterior Nasal Swab     Status: None   Collection Time: 05/08/24 11:43 AM   Specimen: Anterior Nasal Swab  Result Value Ref Range Status   SARS Coronavirus 2 by RT PCR NEGATIVE NEGATIVE Final    Comment: (NOTE) SARS-CoV-2 target nucleic acids are NOT DETECTED.  The SARS-CoV-2 RNA is generally  detectable in upper respiratory specimens during the acute phase of infection. The lowest concentration of SARS-CoV-2 viral copies this assay can detect is 138 copies/mL. A negative result does not preclude SARS-Cov-2 infection and should not be used as the sole basis for treatment or other patient management decisions. A negative result may occur with  improper specimen collection/handling, submission of specimen other than nasopharyngeal swab, presence of viral mutation(s) within the areas targeted by this assay, and inadequate number of viral copies(<138 copies/mL). A negative result must be combined with clinical observations, patient history, and epidemiological information. The expected result is Negative.  Fact Sheet for Patients:  BloggerCourse.com  Fact Sheet for Healthcare Providers:  SeriousBroker.it  This test is no t yet approved or cleared by the [place]  FDA and  has been authorized for detection and/or diagnosis of SARS-CoV-2 by FDA under an Emergency Use Authorization (EUA). This EUA will remain  in effect (meaning this test can be used) for the duration of the COVID-19 declaration under Section 564(b)(1) of the Act, 21 U.S.C.section 360bbb-3(b)(1),  unless the authorization is terminated  or revoked sooner.       Influenza A by PCR NEGATIVE NEGATIVE Final   Influenza B by PCR NEGATIVE NEGATIVE Final    Comment: (NOTE) The Xpert Xpress SARS-CoV-2/FLU/RSV plus assay is intended as an aid in the diagnosis of influenza from Nasopharyngeal swab specimens and should not be used as a sole basis for treatment. Nasal washings and aspirates are unacceptable for Xpert Xpress SARS-CoV-2/FLU/RSV testing.  Fact Sheet for Patients: BloggerCourse.com  Fact Sheet for Healthcare Providers: SeriousBroker.it  This test is not yet approved or cleared by the [place]  FDA  and has been authorized for detection and/or diagnosis of SARS-CoV-2 by FDA under an Emergency Use Authorization (EUA). This EUA will remain in effect (meaning this test can be used) for the duration of the COVID-19 declaration under Section 564(b)(1) of the Act, 21 U.S.C. section 360bbb-3(b)(1), unless the authorization is terminated or revoked.     Resp Syncytial Virus by PCR NEGATIVE NEGATIVE Final    Comment: (NOTE) Fact Sheet for Patients: BloggerCourse.com  Fact Sheet for Healthcare Providers: SeriousBroker.it  This test is not yet approved or cleared by the [place]  FDA and has been authorized for detection and/or diagnosis of SARS-CoV-2 by FDA under an Emergency Use Authorization (EUA). This EUA will remain in effect (meaning this test can be used) for the duration of the COVID-19 declaration under Section 564(b)(1) of the Act, 21 U.S.C. section 360bbb-3(b)(1), unless the authorization is terminated or revoked.  Performed at [facility], [address] [place], [place] [address]   Urine Culture     Status: Abnormal   Collection Time: 05/08/24 12:20 PM   Specimen: Urine, Suprapubic  Result Value Ref Range Status   Specimen Description   Final    URINE, SUPRAPUBIC Performed at [facility], [address] [place], [place] [address]    Special Requests   Final    NONE Performed at [facility], [address] [place], [place] [address]    Culture >=100,000 COLONIES/mL ESCHERICHIA COLI (A)  Final   Report Status 05/10/2024 FINAL  Final   Organism ID, Bacteria ESCHERICHIA COLI (A)  Final      Susceptibility   Escherichia coli - MIC*    AMPICILLIN 8 SENSITIVE Sensitive     CEFAZOLIN  (URINE) Value in next row Sensitive      <=1 SENSITIVEThis is a modified FDA-approved test that has been validated and its performance characteristics determined by the reporting laboratory.  This  laboratory is certified under the Clinical Laboratory Improvement Amendments CLIA as qualified to perform high complexity clinical laboratory testing.    CEFEPIME Value in next row Sensitive      <=1 SENSITIVEThis is a modified FDA-approved test that has been validated and its performance characteristics determined by the reporting laboratory.  This laboratory is certified under the Clinical Laboratory Improvement Amendments CLIA as qualified to perform high complexity clinical laboratory testing.    ERTAPENEM Value in next row Sensitive      <=1 SENSITIVEThis is a modified FDA-approved test that has been validated and its performance characteristics determined by the reporting laboratory.  This laboratory is certified under the Clinical Laboratory Improvement Amendments CLIA as qualified to perform high complexity clinical laboratory testing.    CEFTRIAXONE Value in next row Sensitive      <=1 SENSITIVEThis is a modified FDA-approved test that has been validated and its performance characteristics determined by the reporting laboratory.  This laboratory is  certified under the Clinical Laboratory Improvement Amendments CLIA as qualified to perform high complexity clinical laboratory testing.    CIPROFLOXACIN  Value in next row Sensitive      <=1 SENSITIVEThis is a modified FDA-approved test that has been validated and its performance characteristics determined by the reporting laboratory.  This laboratory is certified under the Clinical Laboratory Improvement Amendments CLIA as qualified to perform high complexity clinical laboratory testing.    GENTAMICIN Value in next row Sensitive      <=1 SENSITIVEThis is a modified FDA-approved test that has been validated and its performance characteristics determined by the reporting laboratory.  This laboratory is certified under the Clinical Laboratory Improvement Amendments CLIA as qualified to perform high complexity clinical laboratory testing.     NITROFURANTOIN Value in next row Sensitive      <=1 SENSITIVEThis is a modified FDA-approved test that has been validated and its performance characteristics determined by the reporting laboratory.  This laboratory is certified under the Clinical Laboratory Improvement Amendments CLIA as qualified to perform high complexity clinical laboratory testing.    TRIMETH/SULFA Value in next row Sensitive      <=1 SENSITIVEThis is a modified FDA-approved test that has been validated and its performance characteristics determined by the reporting laboratory.  This laboratory is certified under the Clinical Laboratory Improvement Amendments CLIA as qualified to perform high complexity clinical laboratory testing.    AMPICILLIN/SULBACTAM Value in next row Sensitive      <=1 SENSITIVEThis is a modified FDA-approved test that has been validated and its performance characteristics determined by the reporting laboratory.  This laboratory is certified under the Clinical Laboratory Improvement Amendments CLIA as qualified to perform high complexity clinical laboratory testing.    PIP/TAZO Value in next row Sensitive ug/mL     <=4 SENSITIVEThis is a modified FDA-approved test that has been validated and its performance characteristics determined by the reporting laboratory.  This laboratory is certified under the Clinical Laboratory Improvement Amendments CLIA as qualified to perform high complexity clinical laboratory testing.    MEROPENEM Value in next row Sensitive      <=4 SENSITIVEThis is a modified FDA-approved test that has been validated and its performance characteristics determined by the reporting laboratory.  This laboratory is certified under the Clinical Laboratory Improvement Amendments CLIA as qualified to perform high complexity clinical laboratory testing.    * >=100,000 COLONIES/mL ESCHERICHIA COLI     Labs: BNP (last 3 results) No results for input(s): BNP in the last 8760 hours. Basic Metabolic  Panel: Recent Labs  Lab 05/08/24 1053 05/09/24 0353 05/10/24 0542  NA 141 136 142  K 4.1 3.6 3.5  CL 107 104 107  CO2 25 24 24   GLUCOSE 124* 95 92  BUN 23 23 22   CREATININE 1.38* 1.34* 1.33*  CALCIUM 9.4 8.5* 8.4*   Liver Function Tests: Recent Labs  Lab 05/08/24 1053  AST 14*  ALT 10  ALKPHOS 56  BILITOT 1.0  PROT 7.2  ALBUMIN 3.8   No results for input(s): LIPASE, AMYLASE in the last 168 hours. No results for input(s): AMMONIA in the last 168 hours. CBC: Recent Labs  Lab 05/08/24 1053 05/09/24 0353 05/10/24 0542  WBC 14.8* 10.7* 8.2  HGB 12.2* 10.9* 10.6*  HCT 36.8* 33.4* 32.0*  MCV 96.1 96.0 94.7  PLT 240 218 226   Cardiac Enzymes: No results for input(s): CKTOTAL, CKMB, CKMBINDEX, TROPONINI in the last 168 hours. BNP: Invalid input(s): POCBNP CBG: No results for input(s): GLUCAP in  the last 168 hours. D-Dimer No results for input(s): DDIMER in the last 72 hours. Hgb A1c No results for input(s): HGBA1C in the last 72 hours. Lipid Profile No results for input(s): CHOL, HDL, LDLCALC, TRIG, CHOLHDL, LDLDIRECT in the last 72 hours. Thyroid  function studies No results for input(s): TSH, T4TOTAL, T3FREE, THYROIDAB in the last 72 hours.  Invalid input(s): FREET3 Anemia work up No results for input(s): VITAMINB12, FOLATE, FERRITIN, TIBC, IRON, RETICCTPCT in the last 72 hours. Urinalysis    Component Value Date/Time   COLORURINE YELLOW (A) 05/08/2024 1220   APPEARANCEUR HAZY (A) 05/08/2024 1220   APPEARANCEUR Cloudy 03/09/2013 0636   LABSPEC 1.012 05/08/2024 1220   LABSPEC 1.020 03/09/2013 0636   PHURINE 6.0 05/08/2024 1220   GLUCOSEU NEGATIVE 05/08/2024 1220   GLUCOSEU NEGATIVE 02/24/2023 1000   HGBUR SMALL (A) 05/08/2024 1220   BILIRUBINUR NEGATIVE 05/08/2024 1220   BILIRUBINUR negative 03/12/2016 1818   BILIRUBINUR Negative 03/09/2013 0636   KETONESUR NEGATIVE 05/08/2024 1220   PROTEINUR 30  (A) 05/08/2024 1220   UROBILINOGEN 1.0 02/24/2023 1000   NITRITE POSITIVE (A) 05/08/2024 1220   LEUKOCYTESUR MODERATE (A) 05/08/2024 1220   LEUKOCYTESUR Negative 03/09/2013 0636   Sepsis Labs Recent Labs  Lab 05/08/24 1053 05/09/24 0353 05/10/24 0542  WBC 14.8* 10.7* 8.2   Microbiology Recent Results (from the past 240 hours)  Resp panel by RT-PCR (RSV, Flu A&B, Covid) Anterior Nasal Swab     Status: None   Collection Time: 05/08/24 11:43 AM   Specimen: Anterior Nasal Swab  Result Value Ref Range Status   SARS Coronavirus 2 by RT PCR NEGATIVE NEGATIVE Final    Comment: (NOTE) SARS-CoV-2 target nucleic acids are NOT DETECTED.  The SARS-CoV-2 RNA is generally detectable in upper respiratory specimens during the acute phase of infection. The lowest concentration of SARS-CoV-2 viral copies this assay can detect is 138 copies/mL. A negative result does not preclude SARS-Cov-2 infection and should not be used as the sole basis for treatment or other patient management decisions. A negative result may occur with  improper specimen collection/handling, submission of specimen other than nasopharyngeal swab, presence of viral mutation(s) within the areas targeted by this assay, and inadequate number of viral copies(<138 copies/mL). A negative result must be combined with clinical observations, patient history, and epidemiological information. The expected result is Negative.  Fact Sheet for Patients:  BloggerCourse.com  Fact Sheet for Healthcare Providers:  SeriousBroker.it  This test is no t yet approved or cleared by the [place]  FDA and  has been authorized for detection and/or diagnosis of SARS-CoV-2 by FDA under an Emergency Use Authorization (EUA). This EUA will remain  in effect (meaning this test can be used) for the duration of the COVID-19 declaration under Section 564(b)(1) of the Act, 21 U.S.C.section  360bbb-3(b)(1), unless the authorization is terminated  or revoked sooner.       Influenza A by PCR NEGATIVE NEGATIVE Final   Influenza B by PCR NEGATIVE NEGATIVE Final    Comment: (NOTE) The Xpert Xpress SARS-CoV-2/FLU/RSV plus assay is intended as an aid in the diagnosis of influenza from Nasopharyngeal swab specimens and should not be used as a sole basis for treatment. Nasal washings and aspirates are unacceptable for Xpert Xpress SARS-CoV-2/FLU/RSV testing.  Fact Sheet for Patients: BloggerCourse.com  Fact Sheet for Healthcare Providers: SeriousBroker.it  This test is not yet approved or cleared by the [place]  FDA and has been authorized for detection and/or diagnosis of SARS-CoV-2 by FDA under an  Emergency Use Authorization (EUA). This EUA will remain in effect (meaning this test can be used) for the duration of the COVID-19 declaration under Section 564(b)(1) of the Act, 21 U.S.C. section 360bbb-3(b)(1), unless the authorization is terminated or revoked.     Resp Syncytial Virus by PCR NEGATIVE NEGATIVE Final    Comment: (NOTE) Fact Sheet for Patients: BloggerCourse.com  Fact Sheet for Healthcare Providers: SeriousBroker.it  This test is not yet approved or cleared by the [place]  FDA and has been authorized for detection and/or diagnosis of SARS-CoV-2 by FDA under an Emergency Use Authorization (EUA). This EUA will remain in effect (meaning this test can be used) for the duration of the COVID-19 declaration under Section 564(b)(1) of the Act, 21 U.S.C. section 360bbb-3(b)(1), unless the authorization is terminated or revoked.  Performed at [facility], [address], [place], [place] [address]   Urine Culture     Status: Abnormal   Collection Time: 05/08/24 12:20 PM   Specimen: Urine, Suprapubic  Result Value Ref Range Status   Specimen  Description   Final    URINE, SUPRAPUBIC Performed at [facility], [address] [place], [place] [address]    Special Requests   Final    NONE Performed at [facility], [address] [place], [place] [address]    Culture >=100,000 COLONIES/mL ESCHERICHIA COLI (A)  Final   Report Status 05/10/2024 FINAL  Final   Organism ID, Bacteria ESCHERICHIA COLI (A)  Final      Susceptibility   Escherichia coli - MIC*    AMPICILLIN 8 SENSITIVE Sensitive     CEFAZOLIN  (URINE) Value in next row Sensitive      <=1 SENSITIVEThis is a modified FDA-approved test that has been validated and its performance characteristics determined by the reporting laboratory.  This laboratory is certified under the Clinical Laboratory Improvement Amendments CLIA as qualified to perform high complexity clinical laboratory testing.    CEFEPIME Value in next row Sensitive      <=1 SENSITIVEThis is a modified FDA-approved test that has been validated and its performance characteristics determined by the reporting laboratory.  This laboratory is certified under the Clinical Laboratory Improvement Amendments CLIA as qualified to perform high complexity clinical laboratory testing.    ERTAPENEM Value in next row Sensitive      <=1 SENSITIVEThis is a modified FDA-approved test that has been validated and its performance characteristics determined by the reporting laboratory.  This laboratory is certified under the Clinical Laboratory Improvement Amendments CLIA as qualified to perform high complexity clinical laboratory testing.    CEFTRIAXONE Value in next row Sensitive      <=1 SENSITIVEThis is a modified FDA-approved test that has been validated and its performance characteristics determined by the reporting laboratory.  This laboratory is certified under the Clinical Laboratory Improvement Amendments CLIA as qualified to perform high complexity clinical laboratory testing.    CIPROFLOXACIN  Value in next  row Sensitive      <=1 SENSITIVEThis is a modified FDA-approved test that has been validated and its performance characteristics determined by the reporting laboratory.  This laboratory is certified under the Clinical Laboratory Improvement Amendments CLIA as qualified to perform high complexity clinical laboratory testing.    GENTAMICIN Value in next row Sensitive      <=1 SENSITIVEThis is a modified FDA-approved test that has been validated and its performance characteristics determined by the reporting laboratory.  This laboratory is certified under the Clinical Laboratory Improvement Amendments CLIA as  qualified to perform high complexity clinical laboratory testing.    NITROFURANTOIN Value in next row Sensitive      <=1 SENSITIVEThis is a modified FDA-approved test that has been validated and its performance characteristics determined by the reporting laboratory.  This laboratory is certified under the Clinical Laboratory Improvement Amendments CLIA as qualified to perform high complexity clinical laboratory testing.    TRIMETH/SULFA Value in next row Sensitive      <=1 SENSITIVEThis is a modified FDA-approved test that has been validated and its performance characteristics determined by the reporting laboratory.  This laboratory is certified under the Clinical Laboratory Improvement Amendments CLIA as qualified to perform high complexity clinical laboratory testing.    AMPICILLIN/SULBACTAM Value in next row Sensitive      <=1 SENSITIVEThis is a modified FDA-approved test that has been validated and its performance characteristics determined by the reporting laboratory.  This laboratory is certified under the Clinical Laboratory Improvement Amendments CLIA as qualified to perform high complexity clinical laboratory testing.    PIP/TAZO Value in next row Sensitive ug/mL     <=4 SENSITIVEThis is a modified FDA-approved test that has been validated and its performance characteristics determined by the  reporting laboratory.  This laboratory is certified under the Clinical Laboratory Improvement Amendments CLIA as qualified to perform high complexity clinical laboratory testing.    MEROPENEM Value in next row Sensitive      <=4 SENSITIVEThis is a modified FDA-approved test that has been validated and its performance characteristics determined by the reporting laboratory.  This laboratory is certified under the Clinical Laboratory Improvement Amendments CLIA as qualified to perform high complexity clinical laboratory testing.    * >=100,000 COLONIES/mL ESCHERICHIA COLI     Time coordinating discharge: 35 minutes  SIGNED:   Anthony CHRISTELLA Pouch, MD  [facility] 05/10/2024, 12:29 PM Pager   If 7PM-7AM, please contact night-coverage [email or web address]

## 2024-05-11 ENCOUNTER — Telehealth: Payer: Self-pay

## 2024-05-11 NOTE — Telephone Encounter (Signed)
 Noted. Thanks.

## 2024-05-11 NOTE — Transitions of Care (Post Inpatient/ED Visit) (Signed)
   05/11/2024  Name: Timothy Bullock MRN: [id number] DOB: [birth year]  Today's TOC FU Call Status: Today's TOC FU Call Status:: Successful TOC FU Call Completed TOC FU Call Complete Date: 05/11/24 Patient's Name and Date of Birth confirmed.  Transition Care Management Follow-up Telephone Call Date of Discharge: 05/10/24 Discharge Facility: [facility]) Type of Discharge: Inpatient Admission Primary Inpatient Discharge Diagnosis:: UTI How have you been since you were released from the hospital?: Better Any questions or concerns?: No  Items Reviewed: Did you receive and understand the discharge instructions provided?: Yes Medications obtained,verified, and reconciled?: Yes (Medications Reviewed) Any new allergies since your discharge?: No Dietary orders reviewed?: Yes Do you have support at home?: No  Medications Reviewed Today: Medications Reviewed Today     Reviewed by Emmitt Pan, LPN (Licensed Practical Nurse) on 05/11/24 at 1056  Med List Status: <None>   Medication Order Taking? Sig Documenting Provider Last Dose Status Informant  apixaban  (ELIQUIS ) 5 MG TABS tablet [id number] Yes Take 1 tablet (5 mg total) by mouth 2 (two) times daily. Cleatus Arlyss RAMAN, MD  Active   ciprofloxacin  (CIPRO ) 500 MG tablet [id number] Yes Take 1 tablet (500 mg total) by mouth 2 (two) times daily for 5 days. Trudy Anthony HERO, MD  Active   gabapentin  (NEURONTIN ) 100 MG capsule [id number] Yes TAKE 1-2 CAPSULES (100-200 MG TOTAL) BY MOUTH AT BEDTIME. Cleatus Arlyss RAMAN, MD  Active   senna ([facility] SENNA) 8.6 MG tablet [id number] Yes Take 2 tablets (17.2 mg total) by mouth daily. Cleatus Arlyss RAMAN, MD  Active             Home Care and Equipment/Supplies: Were Home Health Services Ordered?: NA Any new equipment or medical supplies ordered?: NA  Functional Questionnaire: Do you need assistance with bathing/showering or dressing?: No Do you need assistance with meal  preparation?: No Do you need assistance with eating?: No Do you have difficulty maintaining continence: No Do you need assistance with getting out of bed/getting out of a chair/moving?: No Do you have difficulty managing or taking your medications?: No  Follow up appointments reviewed: PCP Follow-up appointment confirmed?: Yes Date of PCP follow-up appointment?: 05/19/24 Follow-up Provider: [facility] Follow-up appointment confirmed?: NA Do you need transportation to your follow-up appointment?: No Do you understand care options if your condition(s) worsen?: Yes-patient verbalized understanding    SIGNATURE Pan Emmitt, LPN [facility] Nurse Health Advisor Direct Dial [phone]

## 2024-05-19 ENCOUNTER — Encounter: Payer: Self-pay | Admitting: Family Medicine

## 2024-05-19 ENCOUNTER — Ambulatory Visit: Admitting: Family Medicine

## 2024-05-19 VITALS — BP 124/62 | HR 99 | Temp 98.2°F | Ht 68.0 in | Wt 158.2 lb

## 2024-05-19 DIAGNOSIS — Z87440 Personal history of urinary (tract) infections: Secondary | ICD-10-CM

## 2024-05-19 LAB — CBC WITH DIFFERENTIAL/PLATELET
Basophils Absolute: 0.1 K/uL (ref 0.0–0.1)
Basophils Relative: 0.6 % (ref 0.0–3.0)
Eosinophils Absolute: 0.1 K/uL (ref 0.0–0.7)
Eosinophils Relative: 1.3 % (ref 0.0–5.0)
HCT: 36.6 % — ABNORMAL LOW (ref 39.0–52.0)
Hemoglobin: 12 g/dL — ABNORMAL LOW (ref 13.0–17.0)
Lymphocytes Relative: 10.2 % — ABNORMAL LOW (ref 12.0–46.0)
Lymphs Abs: 0.9 K/uL (ref 0.7–4.0)
MCHC: 32.8 g/dL (ref 30.0–36.0)
MCV: 94.6 fl (ref 78.0–100.0)
Monocytes Absolute: 0.7 K/uL (ref 0.1–1.0)
Monocytes Relative: 7.9 % (ref 3.0–12.0)
Neutro Abs: 6.8 K/uL (ref 1.4–7.7)
Neutrophils Relative %: 80 % — ABNORMAL HIGH (ref 43.0–77.0)
Platelets: 403 K/uL — ABNORMAL HIGH (ref 150.0–400.0)
RBC: 3.87 Mil/uL — ABNORMAL LOW (ref 4.22–5.81)
RDW: 13.9 % (ref 11.5–15.5)
WBC: 8.5 K/uL (ref 4.0–10.5)

## 2024-05-19 LAB — BASIC METABOLIC PANEL WITH GFR
BUN: 21 mg/dL (ref 6–23)
CO2: 28 meq/L (ref 19–32)
Calcium: 8.6 mg/dL (ref 8.4–10.5)
Chloride: 107 meq/L (ref 96–112)
Creatinine, Ser: 1.4 mg/dL (ref 0.40–1.50)
GFR: 44.38 mL/min — ABNORMAL LOW (ref >60.00)
Glucose, Bld: 94 mg/dL (ref 70–99)
Potassium: 4.3 meq/L (ref 3.5–5.1)
Sodium: 142 meq/L (ref 135–145)

## 2024-05-19 NOTE — Patient Instructions (Addendum)
Go to the lab on the way out.   If you have mychart we'll likely use that to update you.    Take care.  Glad to see you. I would get a flu shot each fall.   

## 2024-05-19 NOTE — Progress Notes (Signed)
 Inpatient f/u for UTI.    Admit date: 05/08/2024 Discharge date: 05/10/2024   Admitted From: home Disposition:  home    Recommendations for Outpatient Follow-up:  Follow up with PCP in 1-2 weeks Will need outpatient PT & OT    Home Health: Equipment/Devices:   Discharge Condition: stable  CODE STATUS: full  Diet recommendation: Heart Healthy   Brief/Interim Summary: HPI was taken from Dr. Sherre: 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   Vitals in the ED showed T of 97.8, respiration rate 17, heart rate 98, blood pressure 105/56, SpO2 98% on room air.   Serum sodium is 141, potassium 4.1, chloride 107, bicarb 25, BUN of 27, serum creatinine 1.38, EGFR 49, nonfasting blood glucose 124, WBC 14.8, platelets 240, hemoglobin 12.2.   ED treatment: Acetaminophen  1000 mg p.o. one-time dose, LR 1 L bolus, Zosyn  per pharmacy. -------------------------------- At bedside, patient able to tell me his first and last name, age, current calendar year, current location.   He reports he felt weak all over this morning. He denies cough, chest pain, shortness of breath, cough, fever, nausea, vomiting.   He reports he feels so much better now and the weakness has improved with the treatments in the ED.   Discharge Diagnoses:  Principal Problem:   Complicated UTI (urinary tract infection) Active Problems:   Bladder cancer (HCC)   Colostomy status (HCC)   Hypertension   Paroxysmal atrial fibrillation (HCC)   RLS (restless legs syndrome)   Personal history of prostate cancer   Complicated UTI: likely secondary to urostomy. Urine  cx is growing e.coli. Continue on IV zosyn  while inpatient and d/c home on po cipro  to complete the course.    Hx of prostate cancer: management per onco outpatient    Possible peripheral neuropathy: continue on home dose of gabapentin    PAF: s/p ablation. Continue on home dose of eliquis . Rate controlled currently    Generalized weakness: PT/OT recs home health vs outpatient PT/OT  ================================ Inpatient course discussed with patient. Done with cirpro in the meantime.  He had black BMs with cipro  use.  That resolved in the meantime.  No fevers.  No abd pain.  Eating well.  He played pickleball this AM.  He went to PT yesterday.  He is still going to go to PT. he is still dealing with urostomy and colostomy at baseline.  Still on eliquis .  Not bleeding.  Meds, vitals, and allergies reviewed.   ROS: Per HPI unless specifically indicated in ROS section   Nad N[place] Neck supple, no LA IRR, not tachy Ctab Abdomen soft.  Nontender. Normal appearing urostomy and colostomy.  Contents of both bags appear normal. Skin well-perfused. No lower extremity edema.

## 2024-05-21 NOTE — Assessment & Plan Note (Signed)
 History of UTI with benign abdominal exam.  He is feeling better in the meantime.  No change in medications at this point.  See notes on follow-up labs to recheck his creatinine and hemoglobin.  Update me as needed.  He agrees to plan.  Defer recheck urinalysis at this point in the absence of symptoms.  Discussed.

## 2024-05-22 ENCOUNTER — Ambulatory Visit: Payer: Self-pay | Admitting: Family Medicine

## 2024-05-31 ENCOUNTER — Encounter: Payer: Self-pay | Admitting: Family Medicine

## 2024-05-31 ENCOUNTER — Ambulatory Visit: Admitting: Family Medicine

## 2024-05-31 VITALS — BP 110/70 | HR 82 | Temp 98.2°F | Ht 69.0 in | Wt 155.2 lb

## 2024-05-31 DIAGNOSIS — Z8679 Personal history of other diseases of the circulatory system: Secondary | ICD-10-CM

## 2024-05-31 DIAGNOSIS — Z0000 Encounter for general adult medical examination without abnormal findings: Secondary | ICD-10-CM

## 2024-05-31 DIAGNOSIS — D649 Anemia, unspecified: Secondary | ICD-10-CM | POA: Diagnosis not present

## 2024-05-31 DIAGNOSIS — Z23 Encounter for immunization: Secondary | ICD-10-CM

## 2024-05-31 DIAGNOSIS — I480 Paroxysmal atrial fibrillation: Secondary | ICD-10-CM | POA: Diagnosis not present

## 2024-05-31 DIAGNOSIS — G2581 Restless legs syndrome: Secondary | ICD-10-CM | POA: Diagnosis not present

## 2024-05-31 DIAGNOSIS — Z7189 Other specified counseling: Secondary | ICD-10-CM

## 2024-05-31 DIAGNOSIS — Z436 Encounter for attention to other artificial openings of urinary tract: Secondary | ICD-10-CM

## 2024-05-31 LAB — VITAMIN B12: Vitamin B-12: 114 pg/mL — ABNORMAL LOW (ref 211–911)

## 2024-05-31 LAB — LIPID PANEL
Cholesterol: 186 mg/dL (ref 0–200)
HDL: 47.1 mg/dL (ref >39.00)
LDL Cholesterol: 114 mg/dL — ABNORMAL HIGH (ref 0–99)
NonHDL: 138.77
Total CHOL/HDL Ratio: 4
Triglycerides: 123 mg/dL (ref 0.0–149.0)
VLDL: 24.6 mg/dL (ref 0.0–40.0)

## 2024-05-31 LAB — TSH: TSH: 0.94 u[IU]/mL (ref 0.35–5.50)

## 2024-05-31 LAB — FERRITIN: Ferritin: 19.1 ng/mL — ABNORMAL LOW (ref 22.0–322.0)

## 2024-05-31 MED ORDER — APIXABAN 5 MG PO TABS: 5.0000 mg | ORAL_TABLET | Freq: Two times a day (BID) | ORAL | 3 refills | Status: AC

## 2024-05-31 NOTE — Patient Instructions (Signed)
Flu shot today.  Go to the lab on the way out.   If you have mychart we'll likely use that to update you.    Take care.  Glad to see you.

## 2024-05-31 NOTE — Progress Notes (Unsigned)
 He felt well enough to play ping pong yesterday.  Still playing pickleball.   He had dental eval, had a root canal, then needed a post placed, then a permanent filling.  No drainage from the area.    Flu shot today.  Shingles discussed with patient PNA previously done Tetanus 2023 COVID-vaccine previously done Colon cancer screening not due given his age. Prostate cancer screening deferred. Advance directive-daughter Inocente designated if patient incapacitated.  Still anticoagulated.  Prev labs d/w pt.  No blood passed in either bag.    Urostomy and colostomy in place.  He is managing both.    H/o RLS.  Still on gabapentin .  That helps, no ADE on med.    Meds, vitals, and allergies reviewed.   ROS: Per HPI unless specifically indicated in ROS section   GEN: nad, alert and oriented HEENT: mucous membranes moist NECK: supple w/o LA CV: rrr.  no murmur PULM: ctab, no inc wob ABD: soft, +bs EXT: no edema SKIN: no acute rash

## 2024-06-01 NOTE — Assessment & Plan Note (Signed)
 Flu shot today.  Shingles discussed with patient PNA previously done Tetanus 2023 COVID-vaccine previously done Colon cancer screening not due given his age. Prostate cancer screening deferred. Advance directive-daughter Inocente designated if patient incapacitated.

## 2024-06-01 NOTE — Assessment & Plan Note (Signed)
 Urostomy and colostomy in place.  He is managing both.

## 2024-06-01 NOTE — Assessment & Plan Note (Signed)
Advance directive-daughter Izora Gala designated if patient incapacitated.

## 2024-06-01 NOTE — Assessment & Plan Note (Signed)
History of.  See notes on labs. 

## 2024-06-01 NOTE — Assessment & Plan Note (Signed)
 Continue gabapentin  as is.  He can update me as needed.

## 2024-06-01 NOTE — Assessment & Plan Note (Signed)
 History of. Still anticoagulated.  Prev labs d/w pt.  No blood passed in either bag.   No change in meds at this point.  He will update me as needed.

## 2024-06-02 ENCOUNTER — Encounter: Payer: Self-pay | Admitting: Family Medicine

## 2024-06-05 ENCOUNTER — Encounter: Payer: Self-pay | Admitting: Family Medicine

## 2024-06-05 ENCOUNTER — Ambulatory Visit: Payer: Self-pay | Admitting: Family Medicine

## 2024-06-05 DIAGNOSIS — E611 Iron deficiency: Secondary | ICD-10-CM | POA: Insufficient documentation

## 2024-06-05 DIAGNOSIS — E538 Deficiency of other specified B group vitamins: Secondary | ICD-10-CM

## 2024-06-15 MED ORDER — CYANOCOBALAMIN 1000 MCG/ML IJ SOLN: INTRAMUSCULAR | Status: DC

## 2024-06-15 MED ORDER — IRON (FERROUS SULFATE) 325 (65 FE) MG PO TABS: 325.0000 mg | ORAL_TABLET | Freq: Every day | ORAL | Status: AC

## 2024-06-21 ENCOUNTER — Ambulatory Visit

## 2024-06-21 DIAGNOSIS — E538 Deficiency of other specified B group vitamins: Secondary | ICD-10-CM

## 2024-06-21 MED ORDER — CYANOCOBALAMIN 1000 MCG/ML IJ SOLN
1000.0000 ug | Freq: Once | INTRAMUSCULAR | Status: AC
  Administered 2024-06-21: 1000 ug via INTRAMUSCULAR

## 2024-06-21 NOTE — Progress Notes (Signed)
Per orders of Dr. Crawford Givens, injection of B-12 given by Melina Copa in left deltoid. Patient tolerated injection well. Patient will make appointment for 1 month.

## 2024-06-28 ENCOUNTER — Ambulatory Visit

## 2024-06-28 DIAGNOSIS — E538 Deficiency of other specified B group vitamins: Secondary | ICD-10-CM

## 2024-06-28 MED ORDER — CYANOCOBALAMIN 1000 MCG/ML IJ SOLN
1000.0000 ug | Freq: Once | INTRAMUSCULAR | Status: AC
  Administered 2024-06-28: 1000 ug via INTRAMUSCULAR

## 2024-06-28 NOTE — Progress Notes (Signed)
Per orders of Dr. Crawford Givens, injection of B-12 given by Melina Copa in right deltoid. Patient tolerated injection well. Patient will make appointment for 1 month.

## 2024-07-05 ENCOUNTER — Ambulatory Visit (INDEPENDENT_AMBULATORY_CARE_PROVIDER_SITE_OTHER)

## 2024-07-05 DIAGNOSIS — E538 Deficiency of other specified B group vitamins: Secondary | ICD-10-CM

## 2024-07-05 MED ORDER — CYANOCOBALAMIN 1000 MCG/ML IJ SOLN
1000.0000 ug | Freq: Once | INTRAMUSCULAR | Status: AC
  Administered 2024-07-05: 1000 ug via INTRAMUSCULAR

## 2024-07-05 NOTE — Progress Notes (Signed)
 Per orders of Dr. Arlyss Solian, injection of B-12 given by Nellie Hummer in left deltoid. Patient tolerated injection well. Patient will make appointment for 1 week.

## 2024-07-12 ENCOUNTER — Other Ambulatory Visit: Payer: Self-pay | Admitting: Family Medicine

## 2024-07-12 ENCOUNTER — Ambulatory Visit (INDEPENDENT_AMBULATORY_CARE_PROVIDER_SITE_OTHER)

## 2024-07-12 DIAGNOSIS — E538 Deficiency of other specified B group vitamins: Secondary | ICD-10-CM | POA: Diagnosis not present

## 2024-07-12 MED ORDER — CYANOCOBALAMIN 1000 MCG/ML IJ SOLN: INTRAMUSCULAR | Status: AC

## 2024-07-12 MED ORDER — CYANOCOBALAMIN 1000 MCG/ML IJ SOLN
1000.0000 ug | Freq: Once | INTRAMUSCULAR | Status: AC
  Administered 2024-07-12: 1000 ug via INTRAMUSCULAR

## 2024-07-12 NOTE — Progress Notes (Signed)
 Per orders of Dr. Arlyss Solian, injection of B-12 given by Nellie Hummer in right deltoid. Patient tolerated injection well. Patient will make appointment for 1 week.

## 2024-08-30 ENCOUNTER — Other Ambulatory Visit: Payer: Self-pay | Admitting: Family Medicine

## 2024-11-14 ENCOUNTER — Ambulatory Visit: Payer: Medicare HMO
